# Patient Record
Sex: Female | Born: 1970 | Race: Black or African American | Hispanic: No | Marital: Married | Smoking: Former smoker
Health system: Southern US, Community
[De-identification: ages and names within clinical notes are randomized; demographics above are authoritative.]

## PROBLEM LIST (undated history)

## (undated) ENCOUNTER — Emergency Department (HOSPITAL_COMMUNITY): Admission: EM | Payer: Medicare HMO | Source: Home / Self Care

## (undated) DIAGNOSIS — Z8719 Personal history of other diseases of the digestive system: Secondary | ICD-10-CM

## (undated) DIAGNOSIS — E119 Type 2 diabetes mellitus without complications: Secondary | ICD-10-CM

## (undated) DIAGNOSIS — R519 Headache, unspecified: Secondary | ICD-10-CM

## (undated) DIAGNOSIS — F419 Anxiety disorder, unspecified: Secondary | ICD-10-CM

## (undated) DIAGNOSIS — Z87442 Personal history of urinary calculi: Secondary | ICD-10-CM

## (undated) DIAGNOSIS — I10 Essential (primary) hypertension: Secondary | ICD-10-CM

## (undated) DIAGNOSIS — O009 Unspecified ectopic pregnancy without intrauterine pregnancy: Secondary | ICD-10-CM

## (undated) DIAGNOSIS — J45909 Unspecified asthma, uncomplicated: Secondary | ICD-10-CM

## (undated) DIAGNOSIS — M1990 Unspecified osteoarthritis, unspecified site: Secondary | ICD-10-CM

## (undated) DIAGNOSIS — K219 Gastro-esophageal reflux disease without esophagitis: Secondary | ICD-10-CM

## (undated) HISTORY — PX: DILATION AND CURETTAGE OF UTERUS: SHX78

## (undated) HISTORY — PX: WRIST ARTHROSCOPY: SHX838

## (undated) HISTORY — PX: CHOLECYSTECTOMY: SHX55

## (undated) HISTORY — PX: ENDOMETRIAL ABLATION: SHX621

## (undated) HISTORY — PX: TRIGGER FINGER RELEASE: SHX641

## (undated) HISTORY — PX: SALPINGECTOMY: SHX328

## (undated) HISTORY — PX: HIP ARTHROSCOPY: SHX668

## (undated) HISTORY — PX: TUBAL LIGATION: SHX77

---

## 2013-02-06 DIAGNOSIS — X503XXA Overexertion from repetitive movements, initial encounter: Secondary | ICD-10-CM | POA: Insufficient documentation

## 2013-02-06 DIAGNOSIS — M778 Other enthesopathies, not elsewhere classified: Secondary | ICD-10-CM | POA: Insufficient documentation

## 2013-02-24 DIAGNOSIS — M6530 Trigger finger, unspecified finger: Secondary | ICD-10-CM | POA: Insufficient documentation

## 2013-10-14 DIAGNOSIS — Z01419 Encounter for gynecological examination (general) (routine) without abnormal findings: Secondary | ICD-10-CM | POA: Insufficient documentation

## 2015-02-21 DIAGNOSIS — M25859 Other specified joint disorders, unspecified hip: Secondary | ICD-10-CM | POA: Insufficient documentation

## 2015-05-10 DIAGNOSIS — Z9889 Other specified postprocedural states: Secondary | ICD-10-CM | POA: Insufficient documentation

## 2018-07-29 ENCOUNTER — Other Ambulatory Visit: Payer: Self-pay

## 2018-07-29 ENCOUNTER — Emergency Department (HOSPITAL_COMMUNITY): Payer: 59

## 2018-07-29 ENCOUNTER — Observation Stay (HOSPITAL_COMMUNITY)
Admission: EM | Admit: 2018-07-29 | Discharge: 2018-07-30 | Disposition: A | Discharge status: Home / Self Care | Payer: 59 | Attending: Internal Medicine | Admitting: Internal Medicine

## 2018-07-29 ENCOUNTER — Encounter (HOSPITAL_COMMUNITY): Payer: Self-pay | Admitting: Emergency Medicine

## 2018-07-29 DIAGNOSIS — I10 Essential (primary) hypertension: Secondary | ICD-10-CM | POA: Diagnosis not present

## 2018-07-29 DIAGNOSIS — R0789 Other chest pain: Principal | ICD-10-CM | POA: Insufficient documentation

## 2018-07-29 DIAGNOSIS — Z7984 Long term (current) use of oral hypoglycemic drugs: Secondary | ICD-10-CM | POA: Diagnosis not present

## 2018-07-29 DIAGNOSIS — E119 Type 2 diabetes mellitus without complications: Secondary | ICD-10-CM | POA: Insufficient documentation

## 2018-07-29 DIAGNOSIS — Z79899 Other long term (current) drug therapy: Secondary | ICD-10-CM | POA: Diagnosis not present

## 2018-07-29 DIAGNOSIS — R079 Chest pain, unspecified: Secondary | ICD-10-CM | POA: Diagnosis not present

## 2018-07-29 DIAGNOSIS — R0602 Shortness of breath: Secondary | ICD-10-CM | POA: Diagnosis present

## 2018-07-29 DIAGNOSIS — Z87891 Personal history of nicotine dependence: Secondary | ICD-10-CM | POA: Insufficient documentation

## 2018-07-29 DIAGNOSIS — Z20828 Contact with and (suspected) exposure to other viral communicable diseases: Secondary | ICD-10-CM | POA: Diagnosis not present

## 2018-07-29 DIAGNOSIS — J45909 Unspecified asthma, uncomplicated: Secondary | ICD-10-CM | POA: Diagnosis not present

## 2018-07-29 DIAGNOSIS — R9431 Abnormal electrocardiogram [ECG] [EKG]: Secondary | ICD-10-CM | POA: Diagnosis not present

## 2018-07-29 DIAGNOSIS — G8929 Other chronic pain: Secondary | ICD-10-CM | POA: Diagnosis not present

## 2018-07-29 DIAGNOSIS — Z8249 Family history of ischemic heart disease and other diseases of the circulatory system: Secondary | ICD-10-CM | POA: Insufficient documentation

## 2018-07-29 HISTORY — DX: Unspecified asthma, uncomplicated: J45.909

## 2018-07-29 HISTORY — DX: Essential (primary) hypertension: I10

## 2018-07-29 HISTORY — DX: Type 2 diabetes mellitus without complications: E11.9

## 2018-07-29 HISTORY — DX: Chest pain, unspecified: R07.9

## 2018-07-29 LAB — COMPREHENSIVE METABOLIC PANEL
ALT: 15 U/L (ref 0–44)
AST: 18 U/L (ref 15–41)
Albumin: 3.8 g/dL (ref 3.5–5.0)
Alkaline Phosphatase: 51 U/L (ref 38–126)
Anion gap: 14 (ref 5–15)
BUN: 11 mg/dL (ref 6–20)
CO2: 21 mmol/L — ABNORMAL LOW (ref 22–32)
Calcium: 9.4 mg/dL (ref 8.9–10.3)
Chloride: 101 mmol/L (ref 98–111)
Creatinine, Ser: 0.84 mg/dL (ref 0.44–1.00)
GFR calc Af Amer: >60 mL/min (ref >60)
GFR calc non Af Amer: >60 mL/min (ref >60)
Glucose, Bld: 249 mg/dL — ABNORMAL HIGH (ref 70–99)
Potassium: 3.9 mmol/L (ref 3.5–5.1)
Sodium: 136 mmol/L (ref 135–145)
Total Bilirubin: 0.4 mg/dL (ref 0.3–1.2)
Total Protein: 7.1 g/dL (ref 6.5–8.1)

## 2018-07-29 LAB — CBC WITH DIFFERENTIAL/PLATELET
Abs Immature Granulocytes: 0.13 10*3/uL — ABNORMAL HIGH (ref 0.00–0.07)
Basophils Absolute: 0 10*3/uL (ref 0.0–0.1)
Basophils Relative: 0 %
Eosinophils Absolute: 0.1 10*3/uL (ref 0.0–0.5)
Eosinophils Relative: 1 %
HCT: 41 % (ref 36.0–46.0)
Hemoglobin: 13.1 g/dL (ref 12.0–15.0)
Immature Granulocytes: 1 %
Lymphocytes Relative: 16 %
Lymphs Abs: 2.1 10*3/uL (ref 0.7–4.0)
MCH: 29.6 pg (ref 26.0–34.0)
MCHC: 32 g/dL (ref 30.0–36.0)
MCV: 92.6 fL (ref 80.0–100.0)
Monocytes Absolute: 0.6 10*3/uL (ref 0.1–1.0)
Monocytes Relative: 5 %
Neutro Abs: 10.6 10*3/uL — ABNORMAL HIGH (ref 1.7–7.7)
Neutrophils Relative %: 77 %
Platelets: 363 10*3/uL (ref 150–400)
RBC: 4.43 MIL/uL (ref 3.87–5.11)
RDW: 12.9 % (ref 11.5–15.5)
WBC: 13.5 10*3/uL — ABNORMAL HIGH (ref 4.0–10.5)
nRBC: 0 % (ref 0.0–0.2)

## 2018-07-29 LAB — TSH: TSH: 0.972 u[IU]/mL (ref 0.350–4.500)

## 2018-07-29 LAB — GLUCOSE, CAPILLARY: Glucose-Capillary: 155 mg/dL — ABNORMAL HIGH (ref 70–99)

## 2018-07-29 LAB — TROPONIN I: Troponin I: <0.03 ng/mL (ref <0.03)

## 2018-07-29 LAB — I-STAT BETA HCG BLOOD, ED (MC, WL, AP ONLY): I-stat hCG, quantitative: <5 m[IU]/mL (ref <5)

## 2018-07-29 MED ORDER — ASPIRIN EC 81 MG PO TBEC
81.0000 mg | DELAYED_RELEASE_TABLET | Freq: Every day | ORAL | Status: DC
  Administered 2018-07-30: 81 mg via ORAL
  Filled 2018-07-29: qty 1

## 2018-07-29 MED ORDER — MORPHINE SULFATE (PF) 4 MG/ML IV SOLN
4.0000 mg | Freq: Once | INTRAVENOUS | Status: AC
  Administered 2018-07-29: 4 mg via INTRAVENOUS
  Filled 2018-07-29: qty 1

## 2018-07-29 MED ORDER — NITROGLYCERIN 0.4 MG SL SUBL
0.4000 mg | SUBLINGUAL_TABLET | SUBLINGUAL | Status: DC | PRN
  Administered 2018-07-30: 0.4 mg via SUBLINGUAL
  Filled 2018-07-29: qty 1

## 2018-07-29 MED ORDER — INSULIN ASPART 100 UNIT/ML ~~LOC~~ SOLN
0.0000 [IU] | SUBCUTANEOUS | Status: DC
  Administered 2018-07-29: 2 [IU] via SUBCUTANEOUS

## 2018-07-29 MED ORDER — ACETAMINOPHEN 325 MG PO TABS: 650.0000 mg | ORAL_TABLET | ORAL | Status: DC | PRN

## 2018-07-29 MED ORDER — IOHEXOL 350 MG/ML SOLN
75.0000 mL | Freq: Once | INTRAVENOUS | Status: AC | PRN
  Administered 2018-07-29: 75 mL via INTRAVENOUS

## 2018-07-29 MED ORDER — HYDROCODONE-ACETAMINOPHEN 7.5-325 MG PO TABS
1.0000 | ORAL_TABLET | Freq: Four times a day (QID) | ORAL | Status: DC | PRN
  Administered 2018-07-29 – 2018-07-30 (×2): 1 via ORAL
  Filled 2018-07-29 (×2): qty 1

## 2018-07-29 MED ORDER — ENOXAPARIN SODIUM 40 MG/0.4ML ~~LOC~~ SOLN
40.0000 mg | SUBCUTANEOUS | Status: DC
  Administered 2018-07-29: 40 mg via SUBCUTANEOUS
  Filled 2018-07-29: qty 0.4

## 2018-07-29 MED ORDER — ALBUTEROL SULFATE (2.5 MG/3ML) 0.083% IN NEBU: 3.0000 mL | INHALATION_SOLUTION | Freq: Four times a day (QID) | RESPIRATORY_TRACT | Status: DC | PRN

## 2018-07-29 MED ORDER — SODIUM CHLORIDE 0.9% FLUSH: 3.0000 mL | INTRAVENOUS | Status: DC | PRN

## 2018-07-29 MED ORDER — VERAPAMIL HCL ER 180 MG PO TBCR
180.0000 mg | EXTENDED_RELEASE_TABLET | Freq: Every day | ORAL | Status: DC
  Administered 2018-07-30: 180 mg via ORAL
  Filled 2018-07-29: qty 1

## 2018-07-29 MED ORDER — ASPIRIN 81 MG PO CHEW
324.0000 mg | CHEWABLE_TABLET | Freq: Once | ORAL | Status: AC
  Administered 2018-07-29: 324 mg via ORAL
  Filled 2018-07-29: qty 4

## 2018-07-29 MED ORDER — SODIUM CHLORIDE 0.9 % IV SOLN: 250.0000 mL | INTRAVENOUS | Status: DC | PRN

## 2018-07-29 MED ORDER — PANTOPRAZOLE SODIUM 40 MG PO TBEC
40.0000 mg | DELAYED_RELEASE_TABLET | Freq: Every day | ORAL | Status: DC
  Administered 2018-07-30: 40 mg via ORAL
  Filled 2018-07-29: qty 1

## 2018-07-29 MED ORDER — SODIUM CHLORIDE 0.9% FLUSH
3.0000 mL | Freq: Two times a day (BID) | INTRAVENOUS | Status: DC
  Administered 2018-07-30 (×2): 3 mL via INTRAVENOUS

## 2018-07-29 MED ORDER — CYCLOBENZAPRINE HCL 10 MG PO TABS
10.0000 mg | ORAL_TABLET | Freq: Every day | ORAL | Status: DC
  Administered 2018-07-29: 10 mg via ORAL
  Filled 2018-07-29: qty 1

## 2018-07-29 MED ORDER — LORATADINE 10 MG PO TABS
10.0000 mg | ORAL_TABLET | Freq: Every evening | ORAL | Status: DC
  Administered 2018-07-29: 10 mg via ORAL
  Filled 2018-07-29: qty 1

## 2018-07-29 MED ORDER — SODIUM CHLORIDE 0.9 % IV BOLUS
1000.0000 mL | Freq: Once | INTRAVENOUS | Status: AC
  Administered 2018-07-29: 1000 mL via INTRAVENOUS

## 2018-07-29 MED ORDER — ONDANSETRON HCL 4 MG/2ML IJ SOLN: 4.0000 mg | Freq: Four times a day (QID) | INTRAMUSCULAR | Status: DC | PRN

## 2018-07-29 MED ORDER — RAMIPRIL 10 MG PO CAPS
10.0000 mg | ORAL_CAPSULE | Freq: Every day | ORAL | Status: DC
  Administered 2018-07-30: 10 mg via ORAL
  Filled 2018-07-29: qty 1

## 2018-07-29 NOTE — H&P (Signed)
History and Physical    Theresa Carter ELF:[id number] DOB: [birth year] DOA: 07/29/2018  PCP: Patient, No Pcp Per   Patient coming from: Home   Chief Complaint: Chest pain, SOB   HPI: Theresa Carter is a 48 y.o. female with medical history significant for hypertension, type 2 diabetes mellitus, and asthma, now presenting to the emergency department for evaluation of chest pain and shortness of breath.  Patient reports that the symptoms began roughly 3 weeks ago, have been intermittent initially, but became more persistent and severe today.  She was evaluated on 07/15/2018 for the symptoms, diagnosed with bronchitis, and treated with azithromycin and prednisone.  Patient thought that she was getting better initially with antibiotic and steroid, but then began to worsen again.  She has not had any chills or fevers during this illness, denies any sick contacts, has not had much of a cough at all, and has not traveled recently.  She denies swelling or tenderness in the lower extremities.  ED Course: Upon arrival to the ED, patient is found to be afebrile, saturating well on room air, and with vitals otherwise stable.  EKG features a sinus rhythm with nonspecific T wave abnormality in the inferior and septal leads.  CTA chest is negative for PE or other acute cardiopulmonary finding.  Chemistry panel is notable for a glucose of 249 and CBC features a leukocytosis to 13,500.  Initial troponin is undetectable.  Patient was given a liter of normal saline, 324 mg of aspirin, and 4 mg IV morphine in the ED.  She remains hemodynamically stable, and no apparent respiratory distress, and will be observed for ongoing evaluation and management.  Review of Systems:  All other systems reviewed and apart from HPI, are negative.  Past Medical History:  Diagnosis Date   Asthma    Diabetes mellitus without complication ([place])    Hypertension     History reviewed. No pertinent surgical history.   reports  that she has quit smoking. She has never used smokeless tobacco. She reports current alcohol use. She reports that she does not use drugs.  No Known Allergies  Family History  Problem Relation Age of Onset   Venous thrombosis Mother    Heart attack Brother    Venous thrombosis Son      Prior to Admission medications   Medication Sig Start Date End Date Taking? Authorizing Provider  Canagliflozin (INVOKANA PO) Take 1 tablet by mouth daily.   Yes [provider]  cyclobenzaprine (FLEXERIL) 10 MG tablet Take 1 tablet by mouth at bedtime.   Yes [provider]  HYDROcodone-acetaminophen (NORCO) 7.5-325 MG tablet Take 1 tablet by mouth every 6 (six) hours as needed for pain.   Yes [provider]  levocetirizine (XYZAL) 5 MG tablet Take 5 mg by mouth every evening.   Yes [provider]  liraglutide (VICTOZA) 18 MG/3ML SOPN Inject 0.6 mg into the skin daily.   Yes [provider]  metFORMIN (GLUCOPHAGE) 1000 MG tablet Take 1 tablet by mouth 2 (two) times daily.   Yes [provider]  omeprazole (PRILOSEC) 40 MG capsule Take 40 mg by mouth daily.   Yes [provider]  ramipril (ALTACE) 10 MG capsule Take 1 capsule by mouth daily.   Yes [provider]  verapamil (VERELAN PM) 180 MG 24 hr capsule Take 1 capsule by mouth daily.   Yes [provider]    Physical Exam: Vitals:   07/29/18 1924 07/29/18 2015 07/29/18 2045  07/29/18 2100  BP: (!) 157/90 137/86 135/81 132/85  Pulse: 91 82 86 84  Resp: 18 16 12 17   Temp:      TempSrc:      SpO2: 100% 100% 100% 100%    Constitutional: NAD, calm  Eyes: PERTLA, lids and conjunctivae normal ENMT: Mucous membranes are moist. Posterior pharynx clear of any exudate or lesions.   Neck: normal, supple, no masses, no thyromegaly Respiratory: clear to auscultation bilaterally, no wheezing, no crackles. Normal respiratory effort.   Cardiovascular: S1 & S2 heard, regular  rate and rhythm. No extremity edema.  Abdomen: No distension, no tenderness, soft. Bowel sounds normal.  Musculoskeletal: no clubbing / cyanosis. No joint deformity upper and lower extremities.   Skin: no significant rashes, lesions, ulcers. Warm, dry, well-perfused. Neurologic: CN 2-12 grossly intact. Sensation intact. Strength 5/5 in all 4 limbs.  Psychiatric: Alert and oriented x 3. Calm, cooperative.    Labs on Admission: I have personally reviewed following labs and imaging studies  CBC: Recent Labs  Lab 07/29/18 1929  WBC 13.5*  NEUTROABS 10.6*  HGB 13.1  HCT 41.0  MCV 92.6  PLT 259   Basic Metabolic Panel: Recent Labs  Lab 07/29/18 1929  NA 136  K 3.9  CL 101  CO2 21*  GLUCOSE 249*  BUN 11  CREATININE 0.84  CALCIUM 9.4   GFR: CrCl cannot be calculated (Unknown ideal weight.). Liver Function Tests: Recent Labs  Lab 07/29/18 1929  AST 18  ALT 15  ALKPHOS 51  BILITOT 0.4  PROT 7.1  ALBUMIN 3.8   No results for input(s): LIPASE, AMYLASE in the last 168 hours. No results for input(s): AMMONIA in the last 168 hours. Coagulation Profile: No results for input(s): INR, PROTIME in the last 168 hours. Cardiac Enzymes: Recent Labs  Lab 07/29/18 1929  TROPONINI <0.03   BNP (last 3 results) No results for input(s): PROBNP in the last 8760 hours. HbA1C: No results for input(s): HGBA1C in the last 72 hours. CBG: No results for input(s): GLUCAP in the last 168 hours. Lipid Profile: No results for input(s): CHOL, HDL, LDLCALC, TRIG, CHOLHDL, LDLDIRECT in the last 72 hours. Thyroid Function Tests: No results for input(s): TSH, T4TOTAL, FREET4, T3FREE, THYROIDAB in the last 72 hours. Anemia Panel: No results for input(s): VITAMINB12, FOLATE, FERRITIN, TIBC, IRON, RETICCTPCT in the last 72 hours. Urine analysis: No results found for: COLORURINE, APPEARANCEUR, LABSPEC, PHURINE, GLUCOSEU, HGBUR, BILIRUBINUR, KETONESUR, PROTEINUR, UROBILINOGEN, NITRITE,  LEUKOCYTESUR Sepsis Labs: @LABRCNTIP (procalcitonin:4,lacticidven:4) )No results found for this or any previous visit (from the past 240 hour(s)).   Radiological Exams on Admission: Ct Angio Chest Pe W And/or Wo Contrast  Result Date: 07/29/2018 CLINICAL DATA:  Shortness of breath and chest pain EXAM: CT ANGIOGRAPHY CHEST WITH CONTRAST TECHNIQUE: Multidetector CT imaging of the chest was performed using the standard protocol during bolus administration of intravenous contrast. Multiplanar CT image reconstructions and MIPs were obtained to evaluate the vascular anatomy. CONTRAST:  47mL OMNIPAQUE IOHEXOL 350 MG/ML SOLN COMPARISON:  Chest radiograph July 29, 2018 FINDINGS: Cardiovascular: There is no demonstrable pulmonary embolus. There is no thoracic aortic aneurysm or dissection. Visualized great vessels appear unremarkable. There is no appreciable pericardial effusion or pericardial thickening. Mediastinum/Nodes: There are nodular opacities throughout the thyroid, with the largest thyroid nodular lesion measuring just over 1 cm. There is no appreciable thoracic adenopathy. No esophageal lesions are evident. Lungs/Pleura: There is no edema or consolidation. No pleural effusion or pleural thickening evident. Upper Abdomen: Gallbladder is  absent. Visualized upper abdominal structures appear unremarkable. Musculoskeletal: There are no blastic or lytic bone lesions. No intramuscular lesions are evident. Review of the MIP images confirms the above findings. IMPRESSION: 1. No demonstrable pulmonary embolus. No thoracic aortic aneurysm or dissection. 2. Multinodular goiter without dominant mass. No further imaging surveillance is felt to be warranted based on consensus guidelines. 3.  Lungs clear. 4.  No appreciable thoracic adenopathy. 5.  Gallbladder absent. Electronically Signed   By: Lowella Grip III M.D.   On: 07/29/2018 20:43   Dg Chest Portable 1 View  Result Date: 07/29/2018 CLINICAL DATA:  Chest pain  and shortness of breath EXAM: PORTABLE CHEST 1 VIEW COMPARISON:  None. FINDINGS: The lungs are clear. Heart size and pulmonary vascularity are normal. No adenopathy. No pneumothorax. No bone lesions. IMPRESSION: No edema or consolidation. Electronically Signed   By: Lowella Grip III M.D.   On: 07/29/2018 19:31    EKG: Independently reviewed. Sinus rhythm, non-specific T-wave abnormality in inferior and septal leads.   Assessment/Plan   1. Chest pain  - Presents with 2-3 wks of chest pain and SOB, becoming more severe and persistent on day of admission  - She does not have known CAD, but RF's include T2DM, HTN, and FHx of premature CAD  - There are non-specific EKG abnormalities with no priors available for comparison; CTA negative for PE or other acute finding; initial troponin <0.03  - She was given ASA 324 mg in ED  - Continue cardiac monitoring, trend EKG, obtain serial troponin measurements, continue ASA    2. Hypertension  - BP at goal  - Continue ramipril and verapamil as tolerated   3. Type II DM  - No A1c on file  - Managed at home with Victoza, Invokana, and metformin  - Check CBG's and use a SSI with Novolog while in hospital   4. Asthma  - No cough or wheeze on admission   - Continue as needed albuterol MDI    5. Chronic pain   - Continue home regimen with as-needed Norco and Flexeril     DVT prophylaxis: Lovenox  Code Status: Full  Family Communication: Discussed with patient  Consults called: None  Admission status: Observation     Vianne Bulls, MD [facility] Pager [phone]  If 7PM-7AM, please contact night-coverage [email or web address] Password [facility]  07/29/2018, 9:59 PM

## 2018-07-29 NOTE — ED Notes (Signed)
ED TO INPATIENT HANDOFF REPORT  ED Nurse Name and Phone #: [phone]  S Name/Age/Gender Theresa Carter 48 y.o. female Room/Bed: 012C/012C  Code Status   Code Status: Not on file  Home/SNF/Other Home Patient oriented to: self, place, time and situation Is this baseline? Yes   Triage Complete: Triage complete  Chief Complaint Chest Pain; SOB; Dizziness  Triage Note Pt c/o intermittent chest pain and shortness of breath  3 weeks. Reports shortness of breath worsening today, recently treated for bronchitis with no change. Denies fevers, no known sick contacts.   Allergies No Known Allergies  Level of Care/Admitting Diagnosis ED Disposition    ED Disposition Condition [facility] Hospital Area: [place] [[id number]]  Level of Care: Progressive [102]  I expect the patient will be discharged within 24 hours: Yes  LOW acuity---Tx typically complete <24 hrs---ACUTE conditions typically can be evaluated <24 hours---LABS likely to return to acceptable Carter <24 hours---IS near functional baseline---EXPECTED to return to current living arrangement---NOT newly hypoxic: Meets criteria for 5C-Observation unit  Diagnosis: Chest pain [191478]  Admitting Physician: Vianne Bulls [[id number]]  Attending Physician: Vianne Bulls [[id number]]  PT Class (Do Not Modify): Observation [104]  PT Acc Code (Do Not Modify): Observation [10022]       B Medical/Surgery History Past Medical History:  Diagnosis Date  . Asthma   . Diabetes mellitus without complication ([place])   . Hypertension    History reviewed. No pertinent surgical history.   A IV Location/Drains/Wounds Patient Lines/Drains/Airways Status   Active Line/Drains/Airways    Name:   Placement date:   Placement time:   Site:   Days:   Peripheral IV 07/29/18 Right Antecubital   07/29/18    1924    Antecubital   less than 1          Intake/Output Last 24 hours  Intake/Output Summary (Last 24 hours) at  07/29/2018 2156 Last data filed at 07/29/2018 2110 Gross per 24 hour  Intake 1000 ml  Output -  Net 1000 ml    Labs/Imaging Results for orders placed or performed during the hospital encounter of 07/29/18 (from the past 48 hour(s))  CBC with Differential     Status: Abnormal   Collection Time: 07/29/18  7:29 PM  Result Value Ref Range   WBC 13.5 (H) 4.0 - 10.5 K/uL   RBC 4.43 3.87 - 5.11 MIL/uL   Hemoglobin 13.1 12.0 - 15.0 g/dL   HCT 41.0 36.0 - 46.0 %   MCV 92.6 80.0 - 100.0 fL   MCH 29.6 26.0 - 34.0 pg   MCHC 32.0 30.0 - 36.0 g/dL   RDW 12.9 11.5 - 15.5 %   Platelets 363 150 - 400 K/uL   nRBC 0.0 0.0 - 0.2 %   Neutrophils Relative % 77 %   Neutro Abs 10.6 (H) 1.7 - 7.7 K/uL   Lymphocytes Relative 16 %   Lymphs Abs 2.1 0.7 - 4.0 K/uL   Monocytes Relative 5 %   Monocytes Absolute 0.6 0.1 - 1.0 K/uL   Eosinophils Relative 1 %   Eosinophils Absolute 0.1 0.0 - 0.5 K/uL   Basophils Relative 0 %   Basophils Absolute 0.0 0.0 - 0.1 K/uL   Immature Granulocytes 1 %   Abs Immature Granulocytes 0.13 (H) 0.00 - 0.07 K/uL    Comment: Performed at [facility], [address], [place], [place] [address]  Troponin I - Once  Status: None   Collection Time: 07/29/18  7:29 PM  Result Value Ref Range   Troponin I <0.03 <0.03 ng/mL    Comment: Performed at [facility], [place] [address] [place], [place] [address]  Comprehensive metabolic panel     Status: Abnormal   Collection Time: 07/29/18  7:29 PM  Result Value Ref Range   Sodium 136 135 - 145 mmol/L   Potassium 3.9 3.5 - 5.1 mmol/L   Chloride 101 98 - 111 mmol/L   CO2 21 (L) 22 - 32 mmol/L   Glucose, Bld 249 (H) 70 - 99 mg/dL   BUN 11 6 - 20 mg/dL   Creatinine, Ser 0.84 0.44 - 1.00 mg/dL   Calcium 9.4 8.9 - 10.3 mg/dL   Total Protein 7.1 6.5 - 8.1 g/dL   Albumin 3.8 3.5 - 5.0 g/dL   AST 18 15 - 41 U/L   ALT 15 0 - 44 U/L   Alkaline Phosphatase 51 38 - 126 U/L   Total Bilirubin 0.4 0.3 - 1.2 mg/dL   GFR calc non  Af Amer >60 >60 mL/min   GFR calc Af Amer >60 >60 mL/min   Anion gap 14 5 - 15    Comment: Performed at [facility], [place] [address], [place], [place] [address]  I-Stat Beta hCG blood, ED ([facility], [facility], [facility] only)     Status: None   Collection Time: 07/29/18  7:41 PM  Result Value Ref Range   I-stat hCG, quantitative <5.0 <5 mIU/mL   Comment 3            Comment:   GEST. AGE      CONC.  (mIU/mL)   <=1 WEEK        5 - 50     2 WEEKS       50 - 500     3 WEEKS       100 - 10,000     4 WEEKS     1,000 - 30,000        FEMALE AND NON-PREGNANT FEMALE:     LESS THAN 5 mIU/mL    Ct Angio Chest Pe W And/or Wo Contrast  Result Date: 07/29/2018 CLINICAL DATA:  Shortness of breath and chest pain EXAM: CT ANGIOGRAPHY CHEST WITH CONTRAST TECHNIQUE: Multidetector CT imaging of the chest was performed using the standard protocol during bolus administration of intravenous contrast. Multiplanar CT image reconstructions and MIPs were obtained to evaluate the vascular anatomy. CONTRAST:  24mL OMNIPAQUE IOHEXOL 350 MG/ML SOLN COMPARISON:  Chest radiograph July 29, 2018 FINDINGS: Cardiovascular: There is no demonstrable pulmonary embolus. There is no thoracic aortic aneurysm or dissection. Visualized great vessels appear unremarkable. There is no appreciable pericardial effusion or pericardial thickening. Mediastinum/Nodes: There are nodular opacities throughout the thyroid, with the largest thyroid nodular lesion measuring just over 1 cm. There is no appreciable thoracic adenopathy. No esophageal lesions are evident. Lungs/Pleura: There is no edema or consolidation. No pleural effusion or pleural thickening evident. Upper Abdomen: Gallbladder is absent. Visualized upper abdominal structures appear unremarkable. Musculoskeletal: There are no blastic or lytic bone lesions. No intramuscular lesions are evident. Review of the MIP images confirms the above findings. IMPRESSION: 1. No demonstrable pulmonary embolus. No  thoracic aortic aneurysm or dissection. 2. Multinodular goiter without dominant mass. No further imaging surveillance is felt to be warranted based on consensus guidelines. 3.  Lungs clear. 4.  No appreciable thoracic adenopathy. 5.  Gallbladder absent. Electronically Signed  By: Lowella Grip III M.D.   On: 07/29/2018 20:43   Dg Chest Portable 1 View  Result Date: 07/29/2018 CLINICAL DATA:  Chest pain and shortness of breath EXAM: PORTABLE CHEST 1 VIEW COMPARISON:  None. FINDINGS: The lungs are clear. Heart size and pulmonary vascularity are normal. No adenopathy. No pneumothorax. No bone lesions. IMPRESSION: No edema or consolidation. Electronically Signed   By: Lowella Grip III M.D.   On: 07/29/2018 19:31    Pending Labs Unresulted Labs (From admission, onward)    Start     Ordered   07/29/18 2130  TSH  ONCE - STAT,   STAT     07/29/18 2129          Vitals/Pain Today's Vitals   07/29/18 2015 07/29/18 2045 07/29/18 2100 07/29/18 2109  BP: 137/86 135/81 132/85   Pulse: 82 86 84   Resp: 16 12 17    Temp:      TempSrc:      SpO2: 100% 100% 100%   PainSc:    5     Isolation Precautions No active isolations  Medications Medications  aspirin chewable tablet 324 mg (324 mg Oral Given 07/29/18 1927)  morphine 4 MG/ML injection 4 mg (4 mg Intravenous Given 07/29/18 1928)  sodium chloride 0.9 % bolus 1,000 mL (0 mLs Intravenous Stopped 07/29/18 2110)  iohexol (OMNIPAQUE) 350 MG/ML injection 75 mL (75 mLs Intravenous Contrast Given 07/29/18 2024)    Mobility walks Low fall risk   Focused Assessments Cardiac Assessment Handoff:    Lab Results  Component Value Date   TROPONINI <0.03 07/29/2018   No results found for: DDIMER Does the Patient currently have chest pain? Yes     R Recommendations: See Admitting Provider Note  Report given to:   Additional Notes:

## 2018-07-29 NOTE — ED Provider Notes (Signed)
[facility] EMERGENCY DEPARTMENT Provider Note   CSN: [id number] Arrival date & time: 07/29/18  1807    History   Chief Complaint Chief Complaint  Patient presents with   Chest Pain   Shortness of Breath    HPI Theresa Carter is a 48 y.o. female history of hypertension, diabetes who presents for evaluation of chest pain.  Patient reports that over the last 2 weeks, she has had cough that is not productive and some mild difficulty breathing.  She was seen in urgent care on 07/15/2018 for evaluation of symptoms.  At that time, she was diagnosed with viral URI/bronchitis and was discharged with azithromycin and prednisone.  Patient reports she finished medications states she felt better for about a day or so but then states that her symptoms worsened 2 days ago.  He states 2 days ago, she started developing some intermittent chest pain and felt like she was having more difficulty breathing. She was using her inhalers with no improvement. Today at about 12 pm she started developing worsening chest pain and shortness of breath. She describes the pain as a heaviness across her chest. She states initially when she got the pain she had some diaphoresis but that has improved.  She reports that since then, chest pain has been constant.  She describes it as a heaviness.  Patient states that pain is worse with deep inspiration.  She also feels like her shortness of breath and chest pain are worse with exertion.  Patient states that she "feels like she cannot get a deep breath in."  Patient states she is not a current smoker and denies any IV or illicit drug use.  She states that she has a history of high blood pressure, diabetes.  She reports brother had heart attack and died at age 26.  She states that she has not had any personal cardiac history.  Patient states she is not currently on OCPs, exogenous hormone use.  Patient denies any recent travel, leg swelling, history of blood clots  or PEs, recent hospitalization, recent surgery.  She does report that her mom had a blood clot in her leg she states that her son has had blood clots and was diagnosed with Behcet's disease and was lupus antibody positive.  Patient reports she has never been tested.  Reports some associated lightheadedness.  She denies any fevers, abdominal pain, nausea/vomiting, numbness/weakness of arms or legs, difficulty ambulating.    The history is provided by the patient.    Past Medical History:  Diagnosis Date   Asthma    Diabetes mellitus without complication ([place])    Hypertension     Patient Active Problem List   Diagnosis Date Noted   Chest pain 07/29/2018   Essential hypertension 07/29/2018   Asthma 07/29/2018   Diabetes mellitus type II, non insulin dependent ([place]) 07/29/2018   Chronic pain 07/29/2018    History reviewed. No pertinent surgical history.   OB History   No obstetric history on file.      Home Medications    Prior to Admission medications   Medication Sig Start Date End Date Taking? Authorizing Provider  Canagliflozin (INVOKANA PO) Take 1 tablet by mouth daily.   Yes [provider]  cyclobenzaprine (FLEXERIL) 10 MG tablet Take 1 tablet by mouth at bedtime.   Yes [provider]  HYDROcodone-acetaminophen (NORCO) 7.5-325 MG tablet Take 1 tablet by mouth every 6 (six) hours as needed for pain.   Yes [provider]  levocetirizine (XYZAL) 5 MG tablet Take 5 mg by mouth every evening.   Yes [provider]  liraglutide (VICTOZA) 18 MG/3ML SOPN Inject 0.6 mg into the skin daily.   Yes [provider]  metFORMIN (GLUCOPHAGE) 1000 MG tablet Take 1 tablet by mouth 2 (two) times daily.   Yes [provider]  omeprazole (PRILOSEC) 40 MG capsule Take 40 mg by mouth daily.   Yes [provider]  ramipril (ALTACE) 10 MG capsule Take 1 capsule by mouth daily.   Yes [provider]  verapamil  (VERELAN PM) 180 MG 24 hr capsule Take 1 capsule by mouth daily.   Yes [provider]    Family History Family History  Problem Relation Age of Onset   Venous thrombosis Mother    Heart attack Brother    Venous thrombosis Son     Social History Social History   Tobacco Use   Smoking status: Former Smoker   Smokeless tobacco: Never Used  Substance Use Topics   Alcohol use: Yes   Drug use: Never     Allergies   Patient has no known allergies.   Review of Systems Review of Systems  Constitutional: Negative for fever.  Respiratory: Positive for cough and shortness of breath.   Cardiovascular: Positive for chest pain.  Gastrointestinal: Positive for diarrhea. Negative for abdominal pain, nausea and vomiting.  Genitourinary: Negative for dysuria and hematuria.  Neurological: Positive for light-headedness. Negative for syncope, weakness and headaches.  All other systems reviewed and are negative.    Physical Exam Updated Vital Signs BP (!) 137/94    Pulse 81    Temp 98.3 F (36.8 C) (Axillary)    Resp 15    Ht 5\' 5"  (1.651 m)    Wt 100.9 kg    SpO2 98%    BMI 37.02 kg/m   Physical Exam Vitals signs and nursing note reviewed.  Constitutional:      Appearance: Normal appearance. She is well-developed.  HENT:     Head: Normocephalic and atraumatic.  Eyes:     General: Lids are normal.     Conjunctiva/sclera: Conjunctivae normal.     Pupils: Pupils are equal, round, and reactive to light.  Neck:     Musculoskeletal: Full passive range of motion without pain.  Cardiovascular:     Rate and Rhythm: Regular rhythm. Tachycardia present.     Pulses: Normal pulses.          Radial pulses are 2+ on the right side and 2+ on the left side.       Dorsalis pedis pulses are 2+ on the right side and 2+ on the left side.     Heart sounds: Normal heart sounds. No murmur. No friction rub. No gallop.   Pulmonary:     Effort: Pulmonary effort is normal. Tachypnea  present.     Breath sounds: Normal breath sounds. No decreased breath sounds or wheezing.     Comments: Patient is tachypneic with mild increased work of breathing.  No evidence of wheezing.  No rales noted. Abdominal:     Palpations: Abdomen is soft. Abdomen is not rigid.     Tenderness: There is no abdominal tenderness. There is no guarding.     Comments: Abdomen is soft, non-distended, non-tender. No rigidity, No guarding. No peritoneal signs.  Musculoskeletal: Normal range of motion.  Skin:    General: Skin is warm and dry.     Capillary Refill: Capillary refill takes  less than 2 seconds.  Neurological:     Mental Status: She is alert and oriented to person, place, and time.     Comments: Follows commands, Moves all extremities  5/5 strength to BUE and BLE  Sensation intact throughout all major nerve distributions  Psychiatric:        Speech: Speech normal.      ED Treatments / Results  Labs (all labs ordered are listed, but only abnormal results are displayed) Labs Reviewed  CBC WITH DIFFERENTIAL/PLATELET - Abnormal; Notable for the following components:      Result Value   WBC 13.5 (*)    Neutro Abs 10.6 (*)    Abs Immature Granulocytes 0.13 (*)    All other components within normal limits  COMPREHENSIVE METABOLIC PANEL - Abnormal; Notable for the following components:   CO2 21 (*)    Glucose, Bld 249 (*)    All other components within normal limits  GLUCOSE, CAPILLARY - Abnormal; Notable for the following components:   Glucose-Capillary 155 (*)    All other components within normal limits  TROPONIN I  TSH  HIV ANTIBODY (ROUTINE TESTING W REFLEX)  BASIC METABOLIC PANEL  CBC  TROPONIN I  TROPONIN I  I-STAT BETA HCG BLOOD, ED ([facility], [facility], [facility] ONLY)    EKG EKG Interpretation  Date/Time:  Tuesday July 29 2018 18:12:46 EDT Ventricular Rate:  104 PR Interval:  176 QRS Duration: 98 QT Interval:  360 QTC Calculation: 473 R Axis:   40 Text Interpretation:  Sinus  tachycardia with occasional Premature ventricular complexes Low voltage QRS Cannot rule out Anterior infarct , age undetermined Abnormal ECG TWI in inferior leads No old tracing to compare Confirmed by Duffy Bruce [phone]) on 07/29/2018 6:59:17 PM   Radiology Ct Angio Chest Pe W And/or Wo Contrast  Result Date: 07/29/2018 CLINICAL DATA:  Shortness of breath and chest pain EXAM: CT ANGIOGRAPHY CHEST WITH CONTRAST TECHNIQUE: Multidetector CT imaging of the chest was performed using the standard protocol during bolus administration of intravenous contrast. Multiplanar CT image reconstructions and MIPs were obtained to evaluate the vascular anatomy. CONTRAST:  35mL OMNIPAQUE IOHEXOL 350 MG/ML SOLN COMPARISON:  Chest radiograph July 29, 2018 FINDINGS: Cardiovascular: There is no demonstrable pulmonary embolus. There is no thoracic aortic aneurysm or dissection. Visualized great vessels appear unremarkable. There is no appreciable pericardial effusion or pericardial thickening. Mediastinum/Nodes: There are nodular opacities throughout the thyroid, with the largest thyroid nodular lesion measuring just over 1 cm. There is no appreciable thoracic adenopathy. No esophageal lesions are evident. Lungs/Pleura: There is no edema or consolidation. No pleural effusion or pleural thickening evident. Upper Abdomen: Gallbladder is absent. Visualized upper abdominal structures appear unremarkable. Musculoskeletal: There are no blastic or lytic bone lesions. No intramuscular lesions are evident. Review of the MIP images confirms the above findings. IMPRESSION: 1. No demonstrable pulmonary embolus. No thoracic aortic aneurysm or dissection. 2. Multinodular goiter without dominant mass. No further imaging surveillance is felt to be warranted based on consensus guidelines. 3.  Lungs clear. 4.  No appreciable thoracic adenopathy. 5.  Gallbladder absent. Electronically Signed   By: Lowella Grip III M.D.   On: 07/29/2018 20:43    Dg Chest Portable 1 View  Result Date: 07/29/2018 CLINICAL DATA:  Chest pain and shortness of breath EXAM: PORTABLE CHEST 1 VIEW COMPARISON:  None. FINDINGS: The lungs are clear. Heart size and pulmonary vascularity are normal. No adenopathy. No pneumothorax. No bone lesions. IMPRESSION: No edema or consolidation. Electronically Signed  By: Lowella Grip III M.D.   On: 07/29/2018 19:31    Procedures Procedures (including critical care time)  Medications Ordered in ED Medications  HYDROcodone-acetaminophen (NORCO) 7.5-325 MG per tablet 1 tablet (1 tablet Oral Given 07/29/18 2302)  ramipril (ALTACE) capsule 10 mg (has no administration in time range)  verapamil (CALAN-SR) CR tablet 180 mg (has no administration in time range)  pantoprazole (PROTONIX) EC tablet 40 mg (has no administration in time range)  cyclobenzaprine (FLEXERIL) tablet 10 mg (has no administration in time range)  loratadine (CLARITIN) tablet 10 mg (10 mg Oral Given 07/29/18 2302)  aspirin EC tablet 81 mg (has no administration in time range)  nitroGLYCERIN (NITROSTAT) SL tablet 0.4 mg (has no administration in time range)  acetaminophen (TYLENOL) tablet 650 mg (has no administration in time range)  ondansetron (ZOFRAN) injection 4 mg (has no administration in time range)  insulin aspart (novoLOG) injection 0-9 Units (2 Units Subcutaneous Given 07/29/18 2303)  enoxaparin (LOVENOX) injection 40 mg (40 mg Subcutaneous Given 07/29/18 2302)  sodium chloride flush (NS) 0.9 % injection 3 mL (has no administration in time range)  sodium chloride flush (NS) 0.9 % injection 3 mL (has no administration in time range)  0.9 %  sodium chloride infusion (has no administration in time range)  albuterol (PROVENTIL) (2.5 MG/3ML) 0.083% nebulizer solution 3 mL (has no administration in time range)  aspirin chewable tablet 324 mg (324 mg Oral Given 07/29/18 1927)  morphine 4 MG/ML injection 4 mg (4 mg Intravenous Given 07/29/18 1928)  sodium  chloride 0.9 % bolus 1,000 mL (0 mLs Intravenous Stopped 07/29/18 2110)  iohexol (OMNIPAQUE) 350 MG/ML injection 75 mL (75 mLs Intravenous Contrast Given 07/29/18 2024)     Initial Impression / Assessment and Plan / ED Course  I have reviewed the triage vital signs and the nursing notes.  Pertinent labs & imaging results that were available during my care of the patient were reviewed by me and considered in my medical decision making (see chart for details).        48 y.o. F presents for evaluation of worsening shortness of breath and chest pain.  She states that for the last 2 weeks, she has been having some coughing and difficulty breathing.  Saw urgent care and was evaluated for bronchitis.  Was treated with azithromycin, prednisone and felt better for a few days.  Reports chest pain is of breath today.  Chest pain is worse with deep inspiration but also worse with exertion.  Additionally, she feels like she is having worsening shortness of breath with exertion.  Does not feel like she is wheezing.  No fevers, recent travel, sick contacts.  Initial ED arrival, she is hypertensive, tachycardic.  Concern for ACS etiology versus PE.  Low suspicion for infectious etiology but also consideration.  History/physical exam not concerning for dissection.  CBC shows leukocytosis of 13.5. No anemia. CMP shows bicarb of 21 and glucose of 249. Troponin negative. I-stat beta negative.   CXR negative for any acute infectious etiology. EKG shows T wave inversion in II, III, and AVF. No priors for comparison.   CTA of chest shows no evidence of PE.  There is mention of multi-nodular goiter without dominant mass.  Otherwise no acute.  Given patient's risk factors/history she has a heart score of 4/5. Plan for admission given new EKG findings.   Discussed patient with Dr. Myna Hidalgo (hospitalist). Will admit.   Portions of this note were generated with [employer].  Dictation errors may occur despite  best attempts at proofreading.   Naja Apperson was evaluated in Emergency Department on 07/29/2018 for the symptoms described in the history of present illness. She was evaluated in the context of the global COVID-19 pandemic, which necessitated consideration that the patient might be at risk for infection with the SARS-CoV-2 virus that causes COVID-19. Institutional protocols and algorithms that pertain to the evaluation of patients at risk for COVID-19 are in a state of rapid change based on information released by regulatory bodies including the CDC and federal and state organizations. These policies and algorithms were followed during the patient's care in the ED.  Final Clinical Impressions(s) / ED Diagnoses   Final diagnoses:  Shortness of breath  Other chest pain    ED Discharge Orders    None       Desma Mcgregor 07/29/18 2305    Duffy Bruce, MD 07/30/18 [phone]

## 2018-07-29 NOTE — ED Triage Notes (Signed)
Pt c/o intermittent chest pain and shortness of breath  3 weeks. Reports shortness of breath worsening today, recently treated for bronchitis with no change. Denies fevers, no known sick contacts.

## 2018-07-29 NOTE — ED Notes (Signed)
ED Provider at bedside. 

## 2018-07-30 DIAGNOSIS — I10 Essential (primary) hypertension: Secondary | ICD-10-CM | POA: Diagnosis not present

## 2018-07-30 DIAGNOSIS — E119 Type 2 diabetes mellitus without complications: Secondary | ICD-10-CM | POA: Diagnosis not present

## 2018-07-30 DIAGNOSIS — R079 Chest pain, unspecified: Secondary | ICD-10-CM | POA: Diagnosis not present

## 2018-07-30 DIAGNOSIS — R0789 Other chest pain: Secondary | ICD-10-CM | POA: Diagnosis not present

## 2018-07-30 DIAGNOSIS — J45909 Unspecified asthma, uncomplicated: Secondary | ICD-10-CM | POA: Diagnosis not present

## 2018-07-30 LAB — INFLUENZA PANEL BY PCR (TYPE A & B)
Influenza A By PCR: NEGATIVE
Influenza B By PCR: NEGATIVE

## 2018-07-30 LAB — BASIC METABOLIC PANEL
Anion gap: 7 (ref 5–15)
BUN: 13 mg/dL (ref 6–20)
CO2: 24 mmol/L (ref 22–32)
Calcium: 8.9 mg/dL (ref 8.9–10.3)
Chloride: 106 mmol/L (ref 98–111)
Creatinine, Ser: 0.65 mg/dL (ref 0.44–1.00)
GFR calc Af Amer: >60 mL/min (ref >60)
GFR calc non Af Amer: >60 mL/min (ref >60)
Glucose, Bld: 115 mg/dL — ABNORMAL HIGH (ref 70–99)
Potassium: 3.8 mmol/L (ref 3.5–5.1)
Sodium: 137 mmol/L (ref 135–145)

## 2018-07-30 LAB — CBC
HCT: 35.4 % — ABNORMAL LOW (ref 36.0–46.0)
Hemoglobin: 11.6 g/dL — ABNORMAL LOW (ref 12.0–15.0)
MCH: 30.3 pg (ref 26.0–34.0)
MCHC: 32.8 g/dL (ref 30.0–36.0)
MCV: 92.4 fL (ref 80.0–100.0)
Platelets: 327 10*3/uL (ref 150–400)
RBC: 3.83 MIL/uL — ABNORMAL LOW (ref 3.87–5.11)
RDW: 13.1 % (ref 11.5–15.5)
WBC: 11.9 10*3/uL — ABNORMAL HIGH (ref 4.0–10.5)
nRBC: 0 % (ref 0.0–0.2)

## 2018-07-30 LAB — HIV ANTIBODY (ROUTINE TESTING W REFLEX): HIV Screen 4th Generation wRfx: NONREACTIVE

## 2018-07-30 LAB — TROPONIN I
Troponin I: <0.03 ng/mL (ref <0.03)
Troponin I: <0.03 ng/mL (ref <0.03)

## 2018-07-30 LAB — GLUCOSE, CAPILLARY
Glucose-Capillary: 103 mg/dL — ABNORMAL HIGH (ref 70–99)
Glucose-Capillary: 98 mg/dL (ref 70–99)
Glucose-Capillary: 129 mg/dL — ABNORMAL HIGH (ref 70–99)

## 2018-07-30 MED ORDER — ALBUTEROL SULFATE (2.5 MG/3ML) 0.083% IN NEBU: 2.5000 mg | INHALATION_SOLUTION | Freq: Four times a day (QID) | RESPIRATORY_TRACT | 12 refills | Status: DC | PRN

## 2018-07-30 MED ORDER — ALBUTEROL SULFATE (2.5 MG/3ML) 0.083% IN NEBU: 2.5000 mg | INHALATION_SOLUTION | Freq: Four times a day (QID) | RESPIRATORY_TRACT | Status: DC

## 2018-07-30 MED ORDER — ALBUTEROL SULFATE HFA 108 (90 BASE) MCG/ACT IN AERS: 1.0000 | INHALATION_SPRAY | Freq: Four times a day (QID) | RESPIRATORY_TRACT | Status: DC

## 2018-07-30 MED ORDER — GUAIFENESIN-DM 100-10 MG/5ML PO SYRP: 5.0000 mL | ORAL_SOLUTION | ORAL | Status: DC | PRN

## 2018-07-30 MED ORDER — GUAIFENESIN-DM 100-10 MG/5ML PO SYRP: 5.0000 mL | ORAL_SOLUTION | ORAL | 0 refills | Status: DC | PRN

## 2018-07-30 MED ORDER — ASPIRIN 81 MG PO TBEC: 81.0000 mg | DELAYED_RELEASE_TABLET | Freq: Every day | ORAL | 0 refills | Status: DC

## 2018-07-30 MED ORDER — IBUPROFEN 400 MG PO TABS: 400.0000 mg | ORAL_TABLET | Freq: Four times a day (QID) | ORAL | Status: DC | PRN

## 2018-07-30 MED ORDER — IBUPROFEN 400 MG PO TABS: 400.0000 mg | ORAL_TABLET | Freq: Four times a day (QID) | ORAL | 0 refills | Status: DC | PRN

## 2018-07-30 MED ORDER — ALBUTEROL SULFATE HFA 108 (90 BASE) MCG/ACT IN AERS
1.0000 | INHALATION_SPRAY | Freq: Four times a day (QID) | RESPIRATORY_TRACT | Status: DC
  Administered 2018-07-30: 1 via RESPIRATORY_TRACT
  Filled 2018-07-30: qty 6.7

## 2018-07-30 NOTE — Progress Notes (Signed)
Attending cardiology review of echo order:  Due to the spread of COVID-19, departmental policy is to review orders for procedures and determine appropriateness regarding testing at this time. The following criteria are being used to limit potential exposure and spread of infection.  Is the patient being evaluated for COVID-19 infection: YES  Is the patient actively having infectious respiratory symptoms or fever: no reported fever, but respiratory symptoms for 3 weeks Does the patient have a known history of prior cardiovascular disease: hypertension Would the test change management of the patient: not urgently Can the test be performed at a later time: yes, once COVID screen returned (if negative)  Special circumstances: Is the patient undergoing evaluation for embolic CVA: Is the patient planned for chemotherapy:   Based on the review above, this study is not to be performed at this time. If COVID test returns negative, please contact echo department and study can be performed at that time.  Buford Dresser, MD, PhD [facility]  [address], [place], [place] [address] [phone]

## 2018-07-30 NOTE — TOC Transition Note (Signed)
Transition of Care [facility]) - CM/SW Discharge Note Marvetta Gibbons RN, BSN Transitions of Care Unit 4E- RN Case Manager [phone]  Patient Details  Name: Theresa Carter MRN: [id number] Date of Birth: 01/06/1971  [facility] CM/SW Contact:  Dawayne Patricia, RN Phone Number: 07/30/2018, 1:21 PM   Clinical Narrative:    Pt presented with chest pain, has been cleared for transition home, order written for neb. Machine. Call made to [facility] with [facility]- DME to be delivered to bedside prior to discharge.    Final next level of care: Home/Self Care Barriers to Discharge: No Barriers Identified   Patient Goals and CMS Choice Patient states their goals for this hospitalization and ongoing recovery are:: "go home" CMS Medicare.gov Compare Post Acute Care list provided to:: Patient Choice offered to / list presented to : Patient  Discharge Placement  Return home.                      Discharge Plan and Services   Discharge Planning Services: CM Consult Post Acute Care Choice: Durable Medical Equipment          DME Arranged: Nebulizer machine DME Agency: [employer]       Social Determinants of Health (SDOH) Interventions     Readmission Risk Interventions Readmission Risk Prevention Plan 07/30/2018  Post Dischage Appt Complete  Medication Screening Complete  Transportation Screening Complete

## 2018-07-30 NOTE — Discharge Summary (Addendum)
Physician Discharge Summary  Aquilla Shambley JQB:[id number] DOB: [birth year] DOA: 07/29/2018  PCP: Patient, No Pcp Per  Admit date: 07/29/2018 Discharge date: 07/30/2018  Admitted From: Home  Disposition: Home   Recommendations for Outpatient Follow-up:  1. Follow up with PCP in 1-2 weeks 2. Please obtain BMP/CBC in one week 3. Follow covid results.    Equipment/Devices: Nebulizer machine.   Discharge Condition: stable.  CODE STATUS: full code Diet recommendation: Heart Healthy   Brief/Interim Summary:  Theresa Carter is a 48 y.o. female with medical history significant for hypertension, type 2 diabetes mellitus, and asthma, now presenting to the emergency department for evaluation of chest pain and shortness of breath.  Patient reports that the symptoms began roughly 3 weeks ago, have been intermittent initially, but became more persistent and severe today.  She was evaluated on 07/15/2018 for the symptoms, diagnosed with bronchitis, and treated with azithromycin and prednisone.  Patient thought that she was getting better initially with antibiotic and steroid, but then began to worsen again.  She has not had any chills or fevers during this illness, denies any sick contacts, has not had much of a cough at all, and has not traveled recently.  She denies swelling or tenderness in the lower extremities.  ED Course: Upon arrival to the ED, patient is found to be afebrile, saturating well on room air, and with vitals otherwise stable.  EKG features a sinus rhythm with nonspecific T wave abnormality in the inferior and septal leads.  CTA chest is negative for PE or other acute cardiopulmonary finding.  Chemistry panel is notable for a glucose of 249 and CBC features a leukocytosis to 13,500.  Initial troponin is undetectable.  Patient was given a liter of normal saline, 324 mg of aspirin, and 4 mg IV morphine in the ED.  She remains hemodynamically stable, and no apparent respiratory distress,  and will be observed for ongoing evaluation and management.  1-Chest pain; worse on palpation. Likely related to costochondritis. EKG reviewed with cardiology no evidence of ischemia.  CT angio negative for PE. No pericardial effusion.  Follow up with PCP for possible ECHO out-patient.  Troponin times 3 negative.   2-Dyspnea, cough;  For 2 weeks.  Check x ray negative for PNA.  Low risk covid. covid test pending.  Will provide nebulizer albuterol to help with asthma. No wheezing on lung exam.  Chest x ray negative for edema.   on initial patient assessment I use surgical mask and Glasses.   3-HTN; continue with home medications.   4-DM; continue with home medications.     Discharge Diagnoses:  Principal Problem:   Chest pain Active Problems:   Essential hypertension   Asthma   Diabetes mellitus type II, non insulin dependent (HCC)   Chronic pain    Discharge Instructions  Discharge Instructions    Diet - low sodium heart healthy   Complete by:  As directed    Increase activity slowly   Complete by:  As directed      Allergies as of 07/30/2018   No Known Allergies     Medication List    TAKE these medications   albuterol (2.5 MG/3ML) 0.083% nebulizer solution Commonly known as:  PROVENTIL Take 3 mLs (2.5 mg total) by nebulization every 6 (six) hours as needed for wheezing or shortness of breath.   aspirin 81 MG EC tablet Take 1 tablet (81 mg total) by mouth daily. Start taking on:  July 31, 2018  cyclobenzaprine 10 MG tablet Commonly known as:  FLEXERIL Take 1 tablet by mouth at bedtime.   guaiFENesin-dextromethorphan 100-10 MG/5ML syrup Commonly known as:  ROBITUSSIN DM Take 5 mLs by mouth every 4 (four) hours as needed for cough.   HYDROcodone-acetaminophen 7.5-325 MG tablet Commonly known as:  NORCO Take 1 tablet by mouth every 6 (six) hours as needed for pain.   ibuprofen 400 MG tablet Commonly known as:  ADVIL,MOTRIN Take 1 tablet (400 mg  total) by mouth every 6 (six) hours as needed for mild pain.   INVOKANA PO Take 1 tablet by mouth daily.   levocetirizine 5 MG tablet Commonly known as:  XYZAL Take 5 mg by mouth every evening.   liraglutide 18 MG/3ML Sopn Commonly known as:  VICTOZA Inject 0.6 mg into the skin daily.   metFORMIN 1000 MG tablet Commonly known as:  GLUCOPHAGE Take 1 tablet by mouth 2 (two) times daily.   omeprazole 40 MG capsule Commonly known as:  PRILOSEC Take 40 mg by mouth daily.   ramipril 10 MG capsule Commonly known as:  ALTACE Take 1 capsule by mouth daily.   verapamil 180 MG 24 hr capsule Commonly known as:  VERELAN PM Take 1 capsule by mouth daily.            Durable Medical Equipment  (From admission, onward)         Start     Ordered   07/30/18 1139  For home use only DME Nebulizer machine  Once    Question:  Patient needs a nebulizer to treat with the following condition  Answer:  Asthma   07/30/18 1138          No Known Allergies  Consultations:  none   Procedures/Studies: Ct Angio Chest Pe W And/or Wo Contrast  Result Date: 07/29/2018 CLINICAL DATA:  Shortness of breath and chest pain EXAM: CT ANGIOGRAPHY CHEST WITH CONTRAST TECHNIQUE: Multidetector CT imaging of the chest was performed using the standard protocol during bolus administration of intravenous contrast. Multiplanar CT image reconstructions and MIPs were obtained to evaluate the vascular anatomy. CONTRAST:  6mL OMNIPAQUE IOHEXOL 350 MG/ML SOLN COMPARISON:  Chest radiograph July 29, 2018 FINDINGS: Cardiovascular: There is no demonstrable pulmonary embolus. There is no thoracic aortic aneurysm or dissection. Visualized great vessels appear unremarkable. There is no appreciable pericardial effusion or pericardial thickening. Mediastinum/Nodes: There are nodular opacities throughout the thyroid, with the largest thyroid nodular lesion measuring just over 1 cm. There is no appreciable thoracic adenopathy.  No esophageal lesions are evident. Lungs/Pleura: There is no edema or consolidation. No pleural effusion or pleural thickening evident. Upper Abdomen: Gallbladder is absent. Visualized upper abdominal structures appear unremarkable. Musculoskeletal: There are no blastic or lytic bone lesions. No intramuscular lesions are evident. Review of the MIP images confirms the above findings. IMPRESSION: 1. No demonstrable pulmonary embolus. No thoracic aortic aneurysm or dissection. 2. Multinodular goiter without dominant mass. No further imaging surveillance is felt to be warranted based on consensus guidelines. 3.  Lungs clear. 4.  No appreciable thoracic adenopathy. 5.  Gallbladder absent. Electronically Signed   By: Lowella Grip III M.D.   On: 07/29/2018 20:43   Dg Chest Portable 1 View  Result Date: 07/29/2018 CLINICAL DATA:  Chest pain and shortness of breath EXAM: PORTABLE CHEST 1 VIEW COMPARISON:  None. FINDINGS: The lungs are clear. Heart size and pulmonary vascularity are normal. No adenopathy. No pneumothorax. No bone lesions. IMPRESSION: No edema or consolidation. Electronically Signed  By: Lowella Grip III M.D.   On: 07/29/2018 19:31      Subjective: She report constant chest pain, is worse on palpation. After the pain she gets SOB, and gets anxious. Also complaining of cough.  SOB intermittent. Oxygen on RA and on exertion 99 %  Discharge Exam: Vitals:   07/30/18 0800 07/30/18 1250  BP: 130/82   Pulse: 76   Resp: 14   Temp: 98.3 F (36.8 C)   SpO2: 99% 99%     General: Pt is alert, awake, not in acute distress Cardiovascular: RRR, S1/S2 +, no rubs, no gallops Respiratory: CTA bilaterally, no wheezing, no rhonchi Abdominal: Soft, NT, ND, bowel sounds + Extremities: no edema, no cyanosis    The results of significant diagnostics from this hospitalization (including imaging, microbiology, ancillary and laboratory) are listed below for reference.     Microbiology: No  results found for this or any previous visit (from the past 240 hour(s)).   Labs: BNP (last 3 results) No results for input(s): BNP in the last 8760 hours. Basic Metabolic Panel: Recent Labs  Lab 07/29/18 1929 07/30/18 0228  NA 136 137  K 3.9 3.8  CL 101 106  CO2 21* 24  GLUCOSE 249* 115*  BUN 11 13  CREATININE 0.84 0.65  CALCIUM 9.4 8.9   Liver Function Tests: Recent Labs  Lab 07/29/18 1929  AST 18  ALT 15  ALKPHOS 51  BILITOT 0.4  PROT 7.1  ALBUMIN 3.8   No results for input(s): LIPASE, AMYLASE in the last 168 hours. No results for input(s): AMMONIA in the last 168 hours. CBC: Recent Labs  Lab 07/29/18 1929 07/30/18 0228  WBC 13.5* 11.9*  NEUTROABS 10.6*  --   HGB 13.1 11.6*  HCT 41.0 35.4*  MCV 92.6 92.4  PLT 363 327   Cardiac Enzymes: Recent Labs  Lab 07/29/18 1929 07/30/18 0228 07/30/18 0702  TROPONINI <0.03 <0.03 <0.03   BNP: Invalid input(s): POCBNP CBG: Recent Labs  Lab 07/29/18 2245 07/30/18 0452 07/30/18 0739  GLUCAP 155* 103* 98   D-Dimer No results for input(s): DDIMER in the last 72 hours. Hgb A1c No results for input(s): HGBA1C in the last 72 hours. Lipid Profile No results for input(s): CHOL, HDL, LDLCALC, TRIG, CHOLHDL, LDLDIRECT in the last 72 hours. Thyroid function studies Recent Labs    07/29/18 2140  TSH 0.972   Anemia work up No results for input(s): VITAMINB12, FOLATE, FERRITIN, TIBC, IRON, RETICCTPCT in the last 72 hours. Urinalysis No results found for: COLORURINE, APPEARANCEUR, LABSPEC, [place], GLUCOSEU, HGBUR, BILIRUBINUR, KETONESUR, PROTEINUR, UROBILINOGEN, NITRITE, LEUKOCYTESUR Sepsis Labs Invalid input(s): PROCALCITONIN,  WBC,  LACTICIDVEN Microbiology No results found for this or any previous visit (from the past 240 hour(s)).   Time coordinating discharge: 40 minutes  SIGNED:   Elmarie Shiley, MD  [facility]

## 2018-08-01 LAB — NOVEL CORONAVIRUS, NAA (HOSP ORDER, SEND-OUT TO REF LAB; TAT 18-24 HRS): SARS-CoV-2, NAA: NOT DETECTED

## 2018-11-17 ENCOUNTER — Ambulatory Visit (HOSPITAL_COMMUNITY)
Admission: EM | Admit: 2018-11-17 | Discharge: 2018-11-17 | Disposition: A | Discharge status: Home / Self Care | Payer: 59 | Attending: Emergency Medicine | Admitting: Emergency Medicine

## 2018-11-17 ENCOUNTER — Encounter (HOSPITAL_COMMUNITY): Payer: Self-pay | Admitting: *Deleted

## 2018-11-17 ENCOUNTER — Other Ambulatory Visit: Payer: Self-pay

## 2018-11-17 DIAGNOSIS — J45909 Unspecified asthma, uncomplicated: Secondary | ICD-10-CM

## 2018-11-17 DIAGNOSIS — Z20828 Contact with and (suspected) exposure to other viral communicable diseases: Secondary | ICD-10-CM | POA: Insufficient documentation

## 2018-11-17 DIAGNOSIS — Z20822 Contact with and (suspected) exposure to covid-19: Secondary | ICD-10-CM

## 2018-11-17 HISTORY — DX: Unspecified ectopic pregnancy without intrauterine pregnancy: O00.90

## 2018-11-17 NOTE — ED Triage Notes (Signed)
Reports son (living in same household) tested positive for Covid.  Denies any sxs.

## 2018-11-17 NOTE — Discharge Instructions (Addendum)
COVID test pending: Important to stay home and isolate until test results are back. Return if you develop fever, cough, shortness of breath.

## 2018-11-17 NOTE — ED Provider Notes (Signed)
[facility]    CSN: [id number] Arrival date & time: 11/17/18  1017     History   Chief Complaint No chief complaint on file.   HPI Theresa Carter is a 48 y.o. female with history of asthma, type 2 diabetes, hypertension presenting for COVID testing.  States that her son, with whom she lives, tested positive on Saturday.  Patient currently asymptomatic, though concerned due to comorbidities: Admitted to hospital on 07/29/2018 due to bronchitis.  Denies hospitalization since and feels her asthma is currently well controlled with current medications.    Past Medical History:  Diagnosis Date  . Asthma   . Diabetes mellitus without complication ([place])   . Ectopic pregnancy   . Hypertension     Patient Active Problem List   Diagnosis Date Noted  . Chest pain 07/29/2018  . Essential hypertension 07/29/2018  . Asthma 07/29/2018  . Diabetes mellitus type II, non insulin dependent ([place]) 07/29/2018  . Chronic pain 07/29/2018    Past Surgical History:  Procedure Laterality Date  . CHOLECYSTECTOMY    . DILATION AND CURETTAGE OF UTERUS    . ENDOMETRIAL ABLATION    . HIP ARTHROSCOPY     x2  . SALPINGECTOMY    . TRIGGER FINGER RELEASE    . TUBAL LIGATION    . WRIST ARTHROSCOPY      OB History   No obstetric history on file.      Home Medications    Prior to Admission medications   Medication Sig Start Date End Date Taking? Authorizing Provider  Canagliflozin (INVOKANA PO) Take 1 tablet by mouth daily.   Yes [provider]  cyclobenzaprine (FLEXERIL) 10 MG tablet Take 1 tablet by mouth at bedtime.   Yes [provider]  HYDROcodone-acetaminophen (NORCO) 7.5-325 MG tablet Take 1 tablet by mouth every 6 (six) hours as needed for pain.   Yes [provider]  levocetirizine (XYZAL) 5 MG tablet Take 5 mg by mouth every evening.   Yes [provider]  liraglutide (VICTOZA) 18 MG/3ML SOPN Inject 0.6 mg into the skin daily.   Yes  [provider]  metFORMIN (GLUCOPHAGE) 1000 MG tablet Take 1 tablet by mouth 2 (two) times daily.   Yes [provider]  omeprazole (PRILOSEC) 40 MG capsule Take 40 mg by mouth daily.   Yes [provider]  ramipril (ALTACE) 10 MG capsule Take 1 capsule by mouth daily.   Yes [provider]  verapamil (VERELAN PM) 180 MG 24 hr capsule Take 1 capsule by mouth daily.   Yes [provider]  albuterol (PROVENTIL) (2.5 MG/3ML) 0.083% nebulizer solution Take 3 mLs (2.5 mg total) by nebulization every 6 (six) hours as needed for wheezing or shortness of breath. 07/30/18   Regalado, Jerald Kief A, MD  aspirin EC 81 MG EC tablet Take 1 tablet (81 mg total) by mouth daily. 07/31/18   Regalado, Belkys A, MD  guaiFENesin-dextromethorphan (ROBITUSSIN DM) 100-10 MG/5ML syrup Take 5 mLs by mouth every 4 (four) hours as needed for cough. 07/30/18   Regalado, Belkys A, MD  ibuprofen (ADVIL,MOTRIN) 400 MG tablet Take 1 tablet (400 mg total) by mouth every 6 (six) hours as needed for mild pain. 07/30/18   Regalado, Cassie Freer, MD    Family History Family History  Problem Relation Age of Onset  . Venous thrombosis Mother   . Heart attack Brother   . Venous thrombosis Son     Social History Social History  Tobacco Use  . Smoking status: Former Research scientist (life sciences)  . Smokeless tobacco: Never Used  Substance Use Topics  . Alcohol use: Yes    Comment: RARE  . Drug use: Never     Allergies   Patient has no known allergies.   Review of Systems Review of Systems  Constitutional: Negative for fatigue and fever.  HENT: Negative for ear pain, sinus pain, sore throat and voice change.   Eyes: Negative for pain, redness and visual disturbance.  Respiratory: Negative for cough and shortness of breath.   Cardiovascular: Negative for chest pain and palpitations.  Gastrointestinal: Negative for abdominal pain, diarrhea, nausea and vomiting.  Endocrine: Negative for polydipsia, polyphagia  and polyuria.  Genitourinary: Negative for difficulty urinating and frequency.  Musculoskeletal: Negative for arthralgias and myalgias.  Skin: Negative for rash and wound.  Neurological: Negative for syncope and headaches.     Physical Exam Triage Vital Signs ED Triage Vitals  Enc Vitals Group     BP      Pulse      Resp      Temp      Temp src      SpO2      Weight      Height      Head Circumference      Peak Flow      Pain Score      Pain Loc      Pain Edu?      Excl. in [place]?    No data found.  Updated Vital Signs BP 135/78   Pulse 79   Temp 98 F (36.7 C) (Temporal)   Resp 16   SpO2 98%   Visual Acuity Right Eye Distance:   Left Eye Distance:   Bilateral Distance:    Right Eye Near:   Left Eye Near:    Bilateral Near:     Physical Exam Constitutional:      General: She is not in acute distress. HENT:     Head: Normocephalic and atraumatic.  Eyes:     General: No scleral icterus.    Pupils: Pupils are equal, round, and reactive to light.  Cardiovascular:     Rate and Rhythm: Normal rate.  Pulmonary:     Effort: Pulmonary effort is normal.  Neurological:     Mental Status: She is alert and oriented to person, place, and time.      UC Treatments / Results  Labs (all labs ordered are listed, but only abnormal results are displayed) Labs Reviewed  NOVEL CORONAVIRUS, NAA (HOSPITAL ORDER, SEND-OUT TO REF LAB)    EKG   Radiology No results found.  Procedures Procedures (including critical care time)  Medications Ordered in UC Medications - No data to display  Initial Impression / Assessment and Plan / UC Course  I have reviewed the triage vital signs and the nursing notes.  Pertinent labs & imaging results that were available during my care of the patient were reviewed by me and considered in my medical decision making (see chart for details).      1.  Close exposure to COVID-19 Cover test pending, information provided and return  precautions were discussed.  2.  Asthma Currently well controlled with current medications: No changes today.  Return precautions discussed, patient verbalized understanding and is agreeable to plan. Final Clinical Impressions(s) / UC Diagnoses   Final diagnoses:  Close Exposure to [id number]-[id number] Virus  Uncomplicated asthma, unspecified asthma severity, unspecified whether persistent  Discharge Instructions     COVID test pending: Important to stay home and isolate until test results are back. Return if you develop fever, cough, shortness of breath.    ED Prescriptions    None     Controlled Substance Prescriptions [place] Controlled Substance Registry consulted? Not Applicable   Quincy Sheehan, [place] 11/17/18 [id number]

## 2018-11-18 LAB — NOVEL CORONAVIRUS, NAA (HOSP ORDER, SEND-OUT TO REF LAB; TAT 18-24 HRS): SARS-CoV-2, NAA: NOT DETECTED

## 2018-11-20 ENCOUNTER — Encounter (HOSPITAL_COMMUNITY): Payer: Self-pay

## 2018-11-30 ENCOUNTER — Ambulatory Visit (HOSPITAL_COMMUNITY)
Admission: EM | Admit: 2018-11-30 | Discharge: 2018-11-30 | Disposition: A | Discharge status: Home / Self Care | Payer: 59 | Attending: Family Medicine | Admitting: Family Medicine

## 2018-11-30 ENCOUNTER — Other Ambulatory Visit: Payer: Self-pay

## 2018-11-30 ENCOUNTER — Encounter (HOSPITAL_COMMUNITY): Payer: Self-pay

## 2018-11-30 DIAGNOSIS — Z20828 Contact with and (suspected) exposure to other viral communicable diseases: Secondary | ICD-10-CM

## 2018-11-30 DIAGNOSIS — R05 Cough: Secondary | ICD-10-CM

## 2018-11-30 DIAGNOSIS — I10 Essential (primary) hypertension: Secondary | ICD-10-CM | POA: Diagnosis not present

## 2018-11-30 DIAGNOSIS — Z20822 Contact with and (suspected) exposure to covid-19: Secondary | ICD-10-CM

## 2018-11-30 NOTE — ED Triage Notes (Signed)
Patient presents to Urgent Care with complaints of fever since this morning. Patient reports highest temp at home was 100.1, son just recovered from [place] and husband currently has it.

## 2018-11-30 NOTE — ED Provider Notes (Signed)
[facility]    CSN: [id number] Arrival date & time: 11/30/18  1349     History   Chief Complaint Chief Complaint  Patient presents with  . Fever    HPI Theresa Carter is a 48 y.o. female.   48 year old female with history of asthma, diabetes comes in for temperature of 100.1.  Patient has multiple COVID positive contact, with son recovering from [place], and had a recent negative test, and husband who was tested positive 11/17/2018, and had another positive test 11/28/2018 despite resolution of symptoms.  She has been checking her temperature periodically, and noted a temp of 100.1 this morning.  She has cough at baseline, denies worsening.  Denies rhinorrhea, nasal congestion.  States due to asthma, anxiety, she is constantly wondering if she is short of breath, or if it is hard to take a breath.  However, denies current shortness of breath.  Denies chills, body aches.  Denies abdominal pain, nausea, vomiting, diarrhea.  Denies loss of taste or smell.  They have tried to quarantine at home, with son and husband quarantined in their own rooms. Former smoker.      Past Medical History:  Diagnosis Date  . Asthma   . Diabetes mellitus without complication ([place])   . Ectopic pregnancy   . Hypertension     Patient Active Problem List   Diagnosis Date Noted  . Chest pain 07/29/2018  . Essential hypertension 07/29/2018  . Asthma 07/29/2018  . Diabetes mellitus type II, non insulin dependent ([place]) 07/29/2018  . Chronic pain 07/29/2018    Past Surgical History:  Procedure Laterality Date  . CHOLECYSTECTOMY    . DILATION AND CURETTAGE OF UTERUS    . ENDOMETRIAL ABLATION    . HIP ARTHROSCOPY     x2  . SALPINGECTOMY    . TRIGGER FINGER RELEASE    . TUBAL LIGATION    . WRIST ARTHROSCOPY      OB History   No obstetric history on file.      Home Medications    Prior to Admission medications   Medication Sig Start Date End Date Taking? Authorizing Provider   albuterol (PROVENTIL) (2.5 MG/3ML) 0.083% nebulizer solution Take 3 mLs (2.5 mg total) by nebulization every 6 (six) hours as needed for wheezing or shortness of breath. 07/30/18   Regalado, Jerald Kief A, MD  aspirin EC 81 MG EC tablet Take 1 tablet (81 mg total) by mouth daily. 07/31/18   Regalado, Belkys A, MD  Canagliflozin (INVOKANA PO) Take 1 tablet by mouth daily.    [provider]  cyclobenzaprine (FLEXERIL) 10 MG tablet Take 1 tablet by mouth at bedtime.    [provider]  guaiFENesin-dextromethorphan (ROBITUSSIN DM) 100-10 MG/5ML syrup Take 5 mLs by mouth every 4 (four) hours as needed for cough. 07/30/18   Regalado, Belkys A, MD  HYDROcodone-acetaminophen (NORCO) 7.5-325 MG tablet Take 1 tablet by mouth every 6 (six) hours as needed for pain.    [provider]  ibuprofen (ADVIL,MOTRIN) 400 MG tablet Take 1 tablet (400 mg total) by mouth every 6 (six) hours as needed for mild pain. 07/30/18   Regalado, Belkys A, MD  levocetirizine (XYZAL) 5 MG tablet Take 5 mg by mouth every evening.    [provider]  liraglutide (VICTOZA) 18 MG/3ML SOPN Inject 0.6 mg into the skin daily.    [provider]  metFORMIN (GLUCOPHAGE) 1000 MG tablet Take 1 tablet by mouth 2 (two) times daily.  [provider]  omeprazole (PRILOSEC) 40 MG capsule Take 40 mg by mouth daily.    [provider]  ramipril (ALTACE) 10 MG capsule Take 1 capsule by mouth daily.    [provider]  verapamil (VERELAN PM) 180 MG 24 hr capsule Take 1 capsule by mouth daily.    [provider]    Family History Family History  Problem Relation Age of Onset  . Venous thrombosis Mother   . Heart attack Brother   . Venous thrombosis Son     Social History Social History   Tobacco Use  . Smoking status: Former Research scientist (life sciences)  . Smokeless tobacco: Never Used  Substance Use Topics  . Alcohol use: Yes    Comment: RARE  . Drug use: Never     Allergies    Patient has no known allergies.   Review of Systems Review of Systems  Reason unable to perform ROS: See HPI as above.     Physical Exam Triage Vital Signs ED Triage Vitals  Enc Vitals Group     BP 11/30/18 1433 (!) 138/94     Pulse Rate 11/30/18 1433 (!) 105     Resp 11/30/18 1433 17     Temp 11/30/18 1433 98.3 F (36.8 C)     Temp Source 11/30/18 1433 Oral     SpO2 11/30/18 1433 98 %     Weight --      Height --      Head Circumference --      Peak Flow --      Pain Score 11/30/18 1431 0     Pain Loc --      Pain Edu? --      Excl. in [place]? --    No data found.  Updated Vital Signs BP (!) 138/94 (BP Location: Right Arm)   Pulse (!) 105   Temp 98.3 F (36.8 C) (Oral)   Resp 17   SpO2 98%   Physical Exam Constitutional:      General: She is not in acute distress.    Appearance: Normal appearance. She is not ill-appearing, toxic-appearing or diaphoretic.  HENT:     Head: Normocephalic and atraumatic.     Mouth/Throat:     Mouth: Mucous membranes are moist.     Pharynx: Oropharynx is clear. Uvula midline.  Neck:     Musculoskeletal: Normal range of motion and neck supple.  Cardiovascular:     Rate and Rhythm: Normal rate and regular rhythm.     Heart sounds: Normal heart sounds. No murmur. No friction rub. No gallop.      Comments: Resolved tachycardia.  Pulmonary:     Effort: Pulmonary effort is normal. No accessory muscle usage, prolonged expiration, respiratory distress or retractions.     Comments: Speaking in full sentences without difficulty.  Lungs clear to auscultation without adventitious lung sounds. Neurological:     General: No focal deficit present.     Mental Status: She is alert and oriented to person, place, and time.    UC Treatments / Results  Labs (all labs ordered are listed, but only abnormal results are displayed) Labs Reviewed  NOVEL CORONAVIRUS, NAA (HOSPITAL ORDER, SEND-OUT TO REF LAB)    EKG   Radiology No results found.   Procedures Procedures (including critical care time)  Medications Ordered in UC Medications - No data to display  Initial Impression / Assessment and Plan / UC Course  I have reviewed the triage vital signs and  the nursing notes.  Pertinent labs & imaging results that were available during my care of the patient were reviewed by me and considered in my medical decision making (see chart for details).    No alarming signs on exam.  Patient with resolved tachycardia on reexamination.  She is afebrile, without hypoxia, lungs clear to auscultation bilaterally.  However, given multiple exposures, will repeat COVID testing at this time.  Reassurance provided.  Patient to return to quarantine, return precautions discussed.  Patient expresses understanding and agrees to plan.  Patient have pulse ox at home, discussed normal ranges and to continue to monitor.  Final Clinical Impressions(s) / UC Diagnoses   Final diagnoses:  Exposure to Covid-19 Virus    ED Prescriptions    None        Ok Edwards, PA-C 11/30/18 1623

## 2018-11-30 NOTE — Discharge Instructions (Signed)
Currently, no alarming signs. Testing ordered. I would like you to quarantine until testing results. If experiencing shortness of breath, trouble breathing, call 911 and provide them with your current situation.

## 2018-12-01 LAB — NOVEL CORONAVIRUS, NAA (HOSP ORDER, SEND-OUT TO REF LAB; TAT 18-24 HRS): SARS-CoV-2, NAA: NOT DETECTED

## 2018-12-02 ENCOUNTER — Encounter (HOSPITAL_COMMUNITY): Payer: Self-pay

## 2018-12-03 ENCOUNTER — Ambulatory Visit (HOSPITAL_COMMUNITY)
Admission: EM | Admit: 2018-12-03 | Discharge: 2018-12-03 | Disposition: A | Discharge status: Home / Self Care | Payer: 59 | Attending: Family Medicine | Admitting: Family Medicine

## 2018-12-03 ENCOUNTER — Other Ambulatory Visit: Payer: Self-pay

## 2018-12-03 ENCOUNTER — Encounter (HOSPITAL_COMMUNITY): Payer: Self-pay

## 2018-12-03 DIAGNOSIS — S46811A Strain of other muscles, fascia and tendons at shoulder and upper arm level, right arm, initial encounter: Secondary | ICD-10-CM | POA: Diagnosis not present

## 2018-12-03 DIAGNOSIS — M79601 Pain in right arm: Secondary | ICD-10-CM

## 2018-12-03 MED ORDER — IBUPROFEN 800 MG PO TABS: 800.0000 mg | ORAL_TABLET | Freq: Three times a day (TID) | ORAL | 0 refills | Status: DC

## 2018-12-03 MED ORDER — DEXAMETHASONE SODIUM PHOSPHATE 10 MG/ML IJ SOLN
INTRAMUSCULAR | Status: AC
  Filled 2018-12-03: qty 1

## 2018-12-03 MED ORDER — PREDNISONE 50 MG PO TABS: 50.0000 mg | ORAL_TABLET | Freq: Every day | ORAL | 0 refills | Status: AC

## 2018-12-03 MED ORDER — DEXAMETHASONE SODIUM PHOSPHATE 10 MG/ML IJ SOLN
10.0000 mg | Freq: Once | INTRAMUSCULAR | Status: AC
  Administered 2018-12-03: 10 mg via INTRAMUSCULAR

## 2018-12-03 MED ORDER — KETOROLAC TROMETHAMINE 60 MG/2ML IM SOLN
30.0000 mg | Freq: Once | INTRAMUSCULAR | Status: AC
  Administered 2018-12-03: 30 mg via INTRAMUSCULAR

## 2018-12-03 MED ORDER — KETOROLAC TROMETHAMINE 30 MG/ML IJ SOLN
INTRAMUSCULAR | Status: AC
  Filled 2018-12-03: qty 1

## 2018-12-03 NOTE — Discharge Instructions (Signed)
We gave you Toradol and Decadron today. Please continue with prednisone daily for the next 5 days, please take with food and in the morning, this will increase your blood sugars temporarily, please monitor Continue using Flexeril After finishing course of prednisone you may take Tylenol and ibuprofen together I sent a referral to physical therapy.  They should contact you to set up an appointment.  If symptoms still persisting please follow-up with orthopedics for further evaluation/imaging. May try [facility], [facility], [facility] or [facility]
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
## 2018-12-03 NOTE — ED Triage Notes (Signed)
Pt states she has pain in her right shoulder, arm and neck . X 2 months now.

## 2018-12-16 ENCOUNTER — Ambulatory Visit: Payer: 59 | Attending: Pediatrics | Admitting: Physical Therapy

## 2018-12-16 ENCOUNTER — Other Ambulatory Visit: Payer: Self-pay

## 2018-12-16 ENCOUNTER — Encounter: Payer: Self-pay | Admitting: Physical Therapy

## 2018-12-16 DIAGNOSIS — M542 Cervicalgia: Secondary | ICD-10-CM

## 2018-12-16 DIAGNOSIS — E119 Type 2 diabetes mellitus without complications: Secondary | ICD-10-CM | POA: Diagnosis not present

## 2018-12-16 DIAGNOSIS — M79601 Pain in right arm: Secondary | ICD-10-CM

## 2018-12-16 DIAGNOSIS — J45909 Unspecified asthma, uncomplicated: Secondary | ICD-10-CM | POA: Diagnosis not present

## 2018-12-16 DIAGNOSIS — M6281 Muscle weakness (generalized): Secondary | ICD-10-CM

## 2018-12-16 DIAGNOSIS — R293 Abnormal posture: Secondary | ICD-10-CM

## 2018-12-16 DIAGNOSIS — G8929 Other chronic pain: Secondary | ICD-10-CM | POA: Diagnosis not present

## 2018-12-16 DIAGNOSIS — I10 Essential (primary) hypertension: Secondary | ICD-10-CM | POA: Diagnosis not present

## 2018-12-16 DIAGNOSIS — Z9049 Acquired absence of other specified parts of digestive tract: Secondary | ICD-10-CM | POA: Diagnosis not present
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
## 2018-12-18 ENCOUNTER — Ambulatory Visit: Payer: 59 | Admitting: Physical Therapy

## 2018-12-18 ENCOUNTER — Encounter: Payer: Self-pay | Admitting: Physical Therapy

## 2018-12-18 ENCOUNTER — Other Ambulatory Visit: Payer: Self-pay

## 2018-12-18 DIAGNOSIS — M6281 Muscle weakness (generalized): Secondary | ICD-10-CM

## 2018-12-18 DIAGNOSIS — M79601 Pain in right arm: Secondary | ICD-10-CM | POA: Diagnosis not present

## 2018-12-18 DIAGNOSIS — M542 Cervicalgia: Secondary | ICD-10-CM

## 2018-12-18 DIAGNOSIS — R293 Abnormal posture: Secondary | ICD-10-CM

## 2018-12-18 NOTE — Therapy (Signed)
[place], [place], [address] Phone: [phone]   Fax:  [phone]  Physical Therapy Treatment  Patient Details  Name: Theresa Carter MRN: [id number] Date of Birth: 10-26-1970 Referring Provider (PT): Delcie Roch, MD   Encounter Date: 12/18/2018  PT End of Session - 12/18/18 0809    Visit Number  2    Number of Visits  13    Date for PT Re-Evaluation  01/30/19    Authorization Type  UHC    PT Start Time  0806    PT Stop Time  0845    PT Time Calculation (min)  39 min    Activity Tolerance  Patient tolerated treatment well    Behavior During Therapy  [facility] for tasks assessed/performed       Past Medical History:  Diagnosis Date  . Asthma   . Diabetes mellitus without complication ([place])   . Ectopic pregnancy   . Hypertension     Past Surgical History:  Procedure Laterality Date  . CHOLECYSTECTOMY    . DILATION AND CURETTAGE OF UTERUS    . ENDOMETRIAL ABLATION    . HIP ARTHROSCOPY     x2  . SALPINGECTOMY    . TRIGGER FINGER RELEASE    . TUBAL LIGATION    . WRIST ARTHROSCOPY      There were no vitals filed for this visit.  Subjective Assessment - 12/18/18 0810    Subjective  I tried to sit up but it was really hard, I kept correcting.    Currently in Pain?  Yes    Pain Score  8     Pain Location  Shoulder    Pain Orientation  Right    Pain Descriptors / Indicators  Sore    Pain Radiating Towards  Rt UE         [facility] PT Assessment - 12/18/18 0001      AROM   Cervical - Right Rotation  56    Cervical - Left Rotation  64      Strength   Right Shoulder Flexion  3-/5   unable to lift through full available ROM, active flex <90                  [facility] Adult PT Treatment/Exercise - 12/18/18 0001      Exercises   Exercises  Shoulder      Shoulder Exercises: Seated   Flexion Limitations  AAROM flx to W arms stretch    Other Seated Exercises  cervical  AROM rotation & sidebend      Shoulder Exercises: Stretch   Other Shoulder Stretches  upper trap/first rib mob with sheet      Manual Therapy   Manual Therapy  Soft tissue mobilization    Manual therapy comments  skilled palpation and monitoring during TPDN    Soft tissue mobilization  Rt upper trap, pectoralis, tennis ball to pectoralis       Trigger Point Dry Needling - 12/18/18 0001    Consent Given?  Yes    Muscles Treated Head and Neck  Upper trapezius;Levator scapulae    Upper Trapezius Response  Twitch reponse elicited;Palpable increased muscle length   Right   Levator Scapulae Response  Twitch response elicited;Palpable increased muscle length   left          PT Education - 12/18/18 0845    Education Details  TPDN & expected outcomes    Person(s) Educated  Patient    Methods  Explanation    Comprehension  Verbalized understanding;Need further instruction       PT Short Term Goals - 12/16/18 1315      PT SHORT TERM GOAL #1   Title  pt will be aware of her posture during day and understand how to correct    Baseline  began educating at eval    Time  3    Period  Weeks    Status  New    Target Date  01/06/19        PT Long Term Goals - 12/16/18 1317      PT LONG TERM GOAL #1   Title  Rt UE strength 5/5    Baseline  see flowsheet    Time  6    Period  Weeks    Status  New    Target Date  01/30/19      PT LONG TERM GOAL #2   Title  cervical rotation within 5 deg Rt to Lt    Baseline  see flowsheet    Time  6    Period  Weeks    Status  New    Target Date  01/30/19      PT LONG TERM GOAL #3   Title  Resolution of dropping objects in right hand    Baseline  drops frequently at eval    Time  6    Period  Weeks    Status  New    Target Date  01/30/19      PT LONG TERM GOAL #4   Title  FOTO to 49% limitation    Baseline  70% limited at eval    Time  6    Period  Weeks    Status  New    Target Date  01/30/19            Plan -  12/18/18 0844    Clinical Impression Statement  Good tolerance to dry needling with notable improvements in postural alignment. GHJ has full PROM but actively limited to approx 90 due to weakness.    PT Treatment/Interventions  ADLs/Self Care Home Management;Cryotherapy;Electrical Stimulation;Ultrasound;Traction;Moist Heat;Iontophoresis 4mg /ml Dexamethasone;Therapeutic activities;Therapeutic exercise;Neuromuscular re-education;Manual techniques;Patient/family education;Passive range of motion;Dry needling;Taping    PT Next Visit Plan  continue DN PRN- pectoralis, subscap    PT Home Exercise Plan  scap retraction, upright posture, upper trap/first rib with sheet, cervical AROM    Consulted and Agree with Plan of Care  Patient       Patient will benefit from skilled therapeutic intervention in order to improve the following deficits and impairments:  Decreased range of motion, Difficulty walking, Impaired UE functional use, Increased muscle spasms, Decreased activity tolerance, Pain, Improper body mechanics, Decreased strength, Postural dysfunction, Impaired sensation  Visit Diagnosis: Pain in right arm  Cervicalgia  Abnormal posture  Muscle weakness (generalized)     Problem List Patient Active Problem List   Diagnosis Date Noted  . Chest pain 07/29/2018  . Essential hypertension 07/29/2018  . Asthma 07/29/2018  . Diabetes mellitus type II, non insulin dependent ([place]) 07/29/2018  . Chronic pain 07/29/2018    Kharlie Bring C. Brooks Stotz PT, DPT 12/18/18 10:13 AM   [facility] [address] [place], [place], [address] Phone: [phone]   Fax:  [phone]  Name: Aryanah Provance MRN: [id number] Date of Birth: December 14, 1970

## 2018-12-22 ENCOUNTER — Encounter: Payer: Self-pay | Admitting: Physical Therapy

## 2018-12-22 ENCOUNTER — Other Ambulatory Visit: Payer: Self-pay

## 2018-12-22 ENCOUNTER — Ambulatory Visit: Payer: 59 | Admitting: Physical Therapy

## 2018-12-22 DIAGNOSIS — M6281 Muscle weakness (generalized): Secondary | ICD-10-CM

## 2018-12-22 DIAGNOSIS — M79601 Pain in right arm: Secondary | ICD-10-CM | POA: Diagnosis not present

## 2018-12-22 DIAGNOSIS — R293 Abnormal posture: Secondary | ICD-10-CM

## 2018-12-22 DIAGNOSIS — M542 Cervicalgia: Secondary | ICD-10-CM

## 2018-12-22 NOTE — Therapy (Signed)
[place], [place], [address] Phone: [phone]   Fax:  [phone]  Physical Therapy Treatment  Patient Details  Name: Theresa Carter MRN: [id number] Date of Birth: [birth year] Referring Provider (PT): Alcario Drought Merrily Pew, MD   Encounter Date: 12/22/2018  PT End of Session - 12/22/18 1254    Visit Number  3    Number of Visits  13    Date for PT Re-Evaluation  01/30/19    Authorization Type  UHC    PT Start Time  1045    PT Stop Time  1130    PT Time Calculation (min)  45 min    Activity Tolerance  Patient tolerated treatment well    Behavior During Therapy  [facility] for tasks assessed/performed       Past Medical History:  Diagnosis Date  . Asthma   . Diabetes mellitus without complication ([place])   . Ectopic pregnancy   . Hypertension     Past Surgical History:  Procedure Laterality Date  . CHOLECYSTECTOMY    . DILATION AND CURETTAGE OF UTERUS    . ENDOMETRIAL ABLATION    . HIP ARTHROSCOPY     x2  . SALPINGECTOMY    . TRIGGER FINGER RELEASE    . TUBAL LIGATION    . WRIST ARTHROSCOPY      There were no vitals filed for this visit.  Subjective Assessment - 12/22/18 1057    Subjective  Pt reporting pain of 6-7/10 in R upper trap/shoulder. Pt reporting soreness following dry needling for at least two days. Pt did report improvements in ROM. Overall pt's believe the DN helped.    How long can you stand comfortably?  immediate discomfort    Currently in Pain?  Yes    Pain Score  7     Pain Location  Shoulder    Pain Orientation  Right    Pain Descriptors / Indicators  Aching;Sore    Pain Type  Acute pain    Pain Onset  More than a month ago                       [facility] Adult PT Treatment/Exercise - 12/22/18 0001      Shoulder Exercises: Supine   Other Supine Exercises  thoracic spine stretch with towel roll vertically holding 5 minutes with UE abducted to 90 degrees.      Other Supine Exercises  cervical retraction x 10 holding 5 seconds with chin tuck      Shoulder Exercises: Seated   Row  Strengthening;Both;10 reps;Theraband    Theraband Level (Shoulder Row)  Level 3 (Green)    Other Seated Exercises  cervical AROM rotation & sidebend    Other Seated Exercises  Upper trap stretch x 2 holding 10 seconds each side      Modalities   Modalities  Moist Heat      Moist Heat Therapy   Number Minutes Moist Heat  10 Minutes    Moist Heat Location  Cervical      Manual Therapy   Manual Therapy  Soft tissue mobilization    Manual therapy comments  manual trigger point release    15 minutes   Soft tissue mobilization  R upper trap and medial scapular border, active trigger points noted   Biofreeze              PT Short Term Goals - 12/22/18 1115  PT SHORT TERM GOAL #1   Title  pt will be aware of her posture during day and understand how to correct    Baseline  began educating at eval    Time  3    Period  Weeks    Status  On-going        PT Long Term Goals - 12/22/18 1115      PT LONG TERM GOAL #1   Title  Rt UE strength 5/5    Baseline  see flowsheet    Time  6    Period  Weeks    Status  New      PT LONG TERM GOAL #2   Title  cervical rotation within 5 deg Rt to Lt    Baseline  see flowsheet    Time  6    Period  Weeks    Status  New      PT LONG TERM GOAL #3   Title  Resolution of dropping objects in right hand    Baseline  drops frequently at eval    Time  6    Period  Weeks    Status  New      PT LONG TERM GOAL #4   Title  FOTO to 49% limitation    Time  6    Period  Weeks    Status  New            Plan - 12/22/18 1104    Clinical Impression Statement  Pt arriving today reporting 7/10 pain in R upper trap. Pt reporting good response to dry needling and wishes to try it again on 12/30/2018. Pt tolerating exericses well. Pt was issued a green theraband to add rows to ther HEP. Pt with active trigger  points noted in R upper trap and medial scpular border with soft tissue mobilization. Continue skilled PT to progress toward goals sets.    Personal Factors and Comorbidities  Comorbidity 1    Comorbidities  h/o cervical herniation & nerve damage (per pt)    Examination-Activity Limitations  Reach Overhead;Bed Mobility;Bend;Sleep;Carry;Dressing;Stand;Lift    Examination-Participation Restrictions  Meal Prep;Cleaning;Community Activity;Driving;Laundry    Stability/Clinical Decision Making  Evolving/Moderate complexity    Rehab Potential  Good    PT Frequency  2x / week    PT Duration  6 weeks    PT Treatment/Interventions  ADLs/Self Care Home Management;Cryotherapy;Electrical Stimulation;Ultrasound;Traction;Moist Heat;Iontophoresis 4mg /ml Dexamethasone;Therapeutic activities;Therapeutic exercise;Neuromuscular re-education;Manual techniques;Patient/family education;Passive range of motion;Dry needling;Taping    PT Next Visit Plan  continue DN PRN on 12/30/2018- pectoralis, subscap    PT Home Exercise Plan  scap retraction, upright posture, upper trap/first rib with sheet, cervical AROM, rows    Consulted and Agree with Plan of Care  Patient       Patient will benefit from skilled therapeutic intervention in order to improve the following deficits and impairments:  Decreased range of motion, Difficulty walking, Impaired UE functional use, Increased muscle spasms, Decreased activity tolerance, Pain, Improper body mechanics, Decreased strength, Postural dysfunction, Impaired sensation  Visit Diagnosis: Cervicalgia  Pain in right arm  Abnormal posture  Muscle weakness (generalized)     Problem List Patient Active Problem List   Diagnosis Date Noted  . Chest pain 07/29/2018  . Essential hypertension 07/29/2018  . Asthma 07/29/2018  . Diabetes mellitus type II, non insulin dependent ([place]) 07/29/2018  . Chronic pain 07/29/2018    Oretha Caprice, PT 12/22/2018, 12:56 PM  [facility] [address]  [place], [place], [address] Phone: [phone]   Fax:  [phone]  Name: Theresa Carter MRN: [id number] Date of Birth: 08-19-1970

## 2018-12-25 ENCOUNTER — Ambulatory Visit: Payer: 59 | Attending: Internal Medicine | Admitting: Physical Therapy

## 2018-12-25 ENCOUNTER — Other Ambulatory Visit: Payer: Self-pay

## 2018-12-25 ENCOUNTER — Encounter: Payer: Self-pay | Admitting: Physical Therapy

## 2018-12-25 DIAGNOSIS — M79601 Pain in right arm: Secondary | ICD-10-CM | POA: Diagnosis present

## 2018-12-25 DIAGNOSIS — M542 Cervicalgia: Secondary | ICD-10-CM | POA: Insufficient documentation

## 2018-12-25 DIAGNOSIS — M6281 Muscle weakness (generalized): Secondary | ICD-10-CM | POA: Insufficient documentation

## 2018-12-25 DIAGNOSIS — R293 Abnormal posture: Secondary | ICD-10-CM | POA: Insufficient documentation

## 2018-12-25 NOTE — Therapy (Signed)
[place], [place], [address] Phone: [phone]   Fax:  [phone]  Physical Therapy Treatment  Patient Details  Name: Theresa Carter MRN: [id number] Date of Birth: 1970/04/26 Referring Provider (PT): Delcie Roch, MD   Encounter Date: 12/25/2018  PT End of Session - 12/25/18 [id number]    Visit Number  4    Number of Visits  13    Date for PT Re-Evaluation  01/30/19    Authorization Type  UHC    PT Start Time  [id number]    PT Stop Time  [id number]    PT Time Calculation (min)  50 min       Past Medical History:  Diagnosis Date  . Asthma   . Diabetes mellitus without complication ([place])   . Ectopic pregnancy   . Hypertension     Past Surgical History:  Procedure Laterality Date  . CHOLECYSTECTOMY    . DILATION AND CURETTAGE OF UTERUS    . ENDOMETRIAL ABLATION    . HIP ARTHROSCOPY     x2  . SALPINGECTOMY    . TRIGGER FINGER RELEASE    . TUBAL LIGATION    . WRIST ARTHROSCOPY      There were no vitals filed for this visit.  Subjective Assessment - 12/25/18 1154    Subjective  PT reports pain today in right neck and shoulder.    Currently in Pain?  Yes    Pain Score  6     Pain Location  Shoulder    Pain Orientation  Right    Pain Descriptors / Indicators  Aching    Aggravating Factors   constant    Pain Relieving Factors  meds         [facility] PT Assessment - 12/25/18 0001      AROM   Cervical - Right Rotation  65    Cervical - Left Rotation  65                   [facility] Adult PT Treatment/Exercise - 12/25/18 0001      Shoulder Exercises: Supine   Horizontal ABduction  10 reps   2 sets   Theraband Level (Shoulder Horizontal ABduction)  Level 2 (Red)    External Rotation  10 reps   2 sets   Theraband Level (Shoulder External Rotation)  Level 2 (Red)    Flexion Limitations  AAROM with cane     Other Supine Exercises  1/2 foam roller pec stretch , then with AROM  horizontal abduct and adduct     Other Supine Exercises  cervical retraction x 10 holding 5 seconds with chin tuck      Shoulder Exercises: Seated   Flexion Limitations  AAROM flx to W arms stretch    Other Seated Exercises  cervical AROM rotation & sidebend    Other Seated Exercises  upper trap and levator stretches       Shoulder Exercises: Standing   Row  20 reps    Theraband Level (Shoulder Row)  Level 3 (Green)      Modalities   Modalities  Electrical Stimulation      Moist Heat Therapy   Number Minutes Moist Heat  15 Minutes    Moist Heat Location  Cervical      Electrical Stimulation   Electrical Stimulation Location  Right uppertrap and shoulder    Electrical Stimulation Action  IFC x 15 minutes     Electrical  Stimulation Parameters  42ma    Electrical Stimulation Goals  Pain             PT Education - 12/25/18 1233    Education Details  HEP    Person(s) Educated  Patient    Methods  Explanation;Handout    Comprehension  Verbalized understanding       PT Short Term Goals - 12/22/18 1115      PT SHORT TERM GOAL #1   Title  pt will be aware of her posture during day and understand how to correct    Baseline  began educating at eval    Time  3    Period  Weeks    Status  On-going        PT Long Term Goals - 12/22/18 1115      PT LONG TERM GOAL #1   Title  Rt UE strength 5/5    Baseline  see flowsheet    Time  6    Period  Weeks    Status  New      PT LONG TERM GOAL #2   Title  cervical rotation within 5 deg Rt to Lt    Baseline  see flowsheet    Time  6    Period  Weeks    Status  New      PT LONG TERM GOAL #3   Title  Resolution of dropping objects in right hand    Baseline  drops frequently at eval    Time  6    Period  Weeks    Status  New      PT LONG TERM GOAL #4   Title  FOTO to 49% limitation    Time  6    Period  Weeks    Status  New            Plan - 12/25/18 1233    Clinical Impression Statement  Pt reports 6-7/10  pain in right neck and shoulder into deltoid. Her Cervical AROM is improved. Continue with pec stretching and scapular strengthening. Reviewed Cervical AROM and stretching. Sore, achey and fatigue reported after therex at 8/10 in right neck and shoulder. Trial of IFC and HMP to decrease pain. Will assess response next session.    PT Next Visit Plan  continue DN PRN on 12/30/2018- pectoralis, subscap; check response to IFC    PT Home Exercise Plan  scap retraction, upright posture, upper trap/first rib with sheet, cervical AROM, green band rows, supine horizontal abduction and ER with red band       Patient will benefit from skilled therapeutic intervention in order to improve the following deficits and impairments:  Decreased range of motion, Difficulty walking, Impaired UE functional use, Increased muscle spasms, Decreased activity tolerance, Pain, Improper body mechanics, Decreased strength, Postural dysfunction, Impaired sensation  Visit Diagnosis: Cervicalgia  Pain in right arm  Abnormal posture  Muscle weakness (generalized)     Problem List Patient Active Problem List   Diagnosis Date Noted  . Chest pain 07/29/2018  . Essential hypertension 07/29/2018  . Asthma 07/29/2018  . Diabetes mellitus type II, non insulin dependent ([place]) 07/29/2018  . Chronic pain 07/29/2018    Dorene Ar, PTA 12/25/2018, 12:54 PM  [facility] [address] [place], [place], [address] Phone: [phone]   Fax:  [phone]  Name: Theresa Carter MRN: [id number] Date of Birth: January 07, 1971

## 2018-12-30 ENCOUNTER — Encounter: Payer: Self-pay | Admitting: Physical Therapy

## 2018-12-30 ENCOUNTER — Ambulatory Visit: Payer: 59 | Admitting: Physical Therapy

## 2018-12-30 ENCOUNTER — Other Ambulatory Visit: Payer: Self-pay

## 2018-12-30 DIAGNOSIS — R293 Abnormal posture: Secondary | ICD-10-CM

## 2018-12-30 DIAGNOSIS — M79601 Pain in right arm: Secondary | ICD-10-CM

## 2018-12-30 DIAGNOSIS — M6281 Muscle weakness (generalized): Secondary | ICD-10-CM

## 2018-12-30 DIAGNOSIS — M542 Cervicalgia: Secondary | ICD-10-CM

## 2018-12-30 NOTE — Therapy (Signed)
[place], [place], [address] Phone: [phone]   Fax:  [phone]  Physical Therapy Treatment  Patient Details  Name: Theresa Carter MRN: [id number] Date of Birth: 1970/05/24 Referring Provider (PT): Alcario Drought Merrily Pew, MD   Encounter Date: 12/30/2018  PT End of Session - 12/30/18 1422    Visit Number  5    Number of Visits  13    Date for PT Re-Evaluation  01/30/19    Authorization Type  UHC    PT Start Time  [id number]    PT Stop Time  1510    PT Time Calculation (min)  53 min    Activity Tolerance  Patient tolerated treatment well    Behavior During Therapy  [facility] for tasks assessed/performed       Past Medical History:  Diagnosis Date  . Asthma   . Diabetes mellitus without complication ([place])   . Ectopic pregnancy   . Hypertension     Past Surgical History:  Procedure Laterality Date  . CHOLECYSTECTOMY    . DILATION AND CURETTAGE OF UTERUS    . ENDOMETRIAL ABLATION    . HIP ARTHROSCOPY     x2  . SALPINGECTOMY    . TRIGGER FINGER RELEASE    . TUBAL LIGATION    . WRIST ARTHROSCOPY      There were no vitals filed for this visit.  Subjective Assessment - 12/30/18 1420    Subjective  Reports Rt deltoid hurts laterally but less N/T into UE. Felt really good after manual therapy to Rt upper trap. A little better sleep.    Currently in Pain?  Yes    Pain Score  5     Pain Location  Shoulder    Pain Orientation  Right;Lateral    Pain Descriptors / Indicators  Sore         [facility] PT Assessment - 12/30/18 0001      Assessment   Medical Diagnosis  Rt shoulder OA, biceps tendinitis    Referring Provider (PT)  Delcie Roch, MD                   [facility] Adult PT Treatment/Exercise - 12/30/18 0001      Shoulder Exercises: Seated   External Rotation  15 reps;AROM    Other Seated Exercises  scapular retraction + rotation, sidebend      Shoulder Exercises: Stretch    Other Shoulder Stretches  open book      Moist Heat Therapy   Number Minutes Moist Heat  15 Minutes    Moist Heat Location  Shoulder   Rt, with IFC     Electrical Stimulation   Electrical Stimulation Location  Rt GHJ with heat    Electrical Stimulation Action  IFC 16 min    Electrical Stimulation Parameters  to tolerance     Electrical Stimulation Goals  Pain      Manual Therapy   Manual Therapy  Joint mobilization    Joint Mobilization  C0 on C1    Soft tissue mobilization  Rt pec major & minor; suboccipital release, bil upper trap             PT Education - 12/30/18 1700    Education Details  resting posture & alignment    Person(s) Educated  Patient    Methods  Explanation    Comprehension  Verbalized understanding;Need further instruction       PT Short Term Goals -  12/22/18 1115      PT SHORT TERM GOAL #1   Title  pt will be aware of her posture during day and understand how to correct    Baseline  began educating at eval    Time  3    Period  Weeks    Status  On-going        PT Long Term Goals - 12/22/18 1115      PT LONG TERM GOAL #1   Title  Rt UE strength 5/5    Baseline  see flowsheet    Time  6    Period  Weeks    Status  New      PT LONG TERM GOAL #2   Title  cervical rotation within 5 deg Rt to Lt    Baseline  see flowsheet    Time  6    Period  Weeks    Status  New      PT LONG TERM GOAL #3   Title  Resolution of dropping objects in right hand    Baseline  drops frequently at eval    Time  6    Period  Weeks    Status  New      PT LONG TERM GOAL #4   Title  FOTO to 49% limitation    Time  6    Period  Weeks    Status  New            Plan - 12/30/18 1500    Clinical Impression Statement  Rests at in a slight Lt cervical rotation and Rt sidebend due to tightness of upper traps. Good tolerance to treatment but did not want DN today. Pectoralis tightness reduced but is still pulling Rt GHJ fwd.    PT  Treatment/Interventions  ADLs/Self Care Home Management;Cryotherapy;Electrical Stimulation;Ultrasound;Traction;Moist Heat;Iontophoresis 4mg /ml Dexamethasone;Therapeutic activities;Therapeutic exercise;Neuromuscular re-education;Manual techniques;Patient/family education;Passive range of motion;Dry needling;Taping    PT Next Visit Plan  DN PRN, pec stretching, prone periscap    PT Home Exercise Plan  scap retraction, upright posture, upper trap/first rib with sheet, cervical AROM, green band rows, supine horizontal abduction and ER with red band    Consulted and Agree with Plan of Care  Patient       Patient will benefit from skilled therapeutic intervention in order to improve the following deficits and impairments:  Decreased range of motion, Difficulty walking, Impaired UE functional use, Increased muscle spasms, Decreased activity tolerance, Pain, Improper body mechanics, Decreased strength, Postural dysfunction, Impaired sensation  Visit Diagnosis: Cervicalgia  Pain in right arm  Abnormal posture  Muscle weakness (generalized)     Problem List Patient Active Problem List   Diagnosis Date Noted  . Chest pain 07/29/2018  . Essential hypertension 07/29/2018  . Asthma 07/29/2018  . Diabetes mellitus type II, non insulin dependent ([place]) 07/29/2018  . Chronic pain 07/29/2018    Nixie Laube C. Kloi Brodman PT, DPT 12/30/18 5:01 PM   [facility] [address] [place], [place], [address] Phone: [phone]   Fax:  [phone]  Name: Quinasia Gussler MRN: [id number] Date of Birth: 1970-08-12

## 2019-01-01 ENCOUNTER — Ambulatory Visit: Payer: 59 | Admitting: Physical Therapy

## 2019-01-01 ENCOUNTER — Other Ambulatory Visit: Payer: Self-pay

## 2019-01-01 ENCOUNTER — Encounter: Payer: Self-pay | Admitting: Physical Therapy

## 2019-01-01 DIAGNOSIS — M79601 Pain in right arm: Secondary | ICD-10-CM

## 2019-01-01 DIAGNOSIS — R293 Abnormal posture: Secondary | ICD-10-CM

## 2019-01-01 DIAGNOSIS — M6281 Muscle weakness (generalized): Secondary | ICD-10-CM

## 2019-01-01 DIAGNOSIS — M542 Cervicalgia: Secondary | ICD-10-CM

## 2019-01-01 NOTE — Therapy (Signed)
[place], [place], [address] Phone: [phone]   Fax:  [phone]  Physical Therapy Treatment  Patient Details  Name: Theresa Carter MRN: [id number] Date of Birth: Oct 31, 1970 Referring Provider (PT): Alcario Drought Merrily Pew, MD   Encounter Date: 01/01/2019  PT End of Session - 01/01/19 0930    Visit Number  6    Number of Visits  13    Date for PT Re-Evaluation  01/30/19    Authorization Type  UHC    PT Start Time  0930    PT Stop Time  1023    PT Time Calculation (min)  53 min    Activity Tolerance  Patient tolerated treatment well    Behavior During Therapy  [facility] for tasks assessed/performed       Past Medical History:  Diagnosis Date  . Asthma   . Diabetes mellitus without complication ([place])   . Ectopic pregnancy   . Hypertension     Past Surgical History:  Procedure Laterality Date  . CHOLECYSTECTOMY    . DILATION AND CURETTAGE OF UTERUS    . ENDOMETRIAL ABLATION    . HIP ARTHROSCOPY     x2  . SALPINGECTOMY    . TRIGGER FINGER RELEASE    . TUBAL LIGATION    . WRIST ARTHROSCOPY      There were no vitals filed for this visit.  Subjective Assessment - 01/01/19 0931    Subjective  Today is not a good day, the whole arm hurts and I dropped everything in my hand. Pectoralis is sore. Was good tuesday, gradually worsened over Wednesday and woke with pain today.    Currently in Pain?  Yes    Pain Score  7     Pain Location  Shoulder    Pain Orientation  Right    Pain Descriptors / Indicators  Sore    Aggravating Factors   constant                       [facility] Adult PT Treatment/Exercise - 01/01/19 0001      Shoulder Exercises: Prone   Retraction  15 reps    Retraction Limitations  tactile cues required    Extension  15 reps    Other Prone Exercises  prone row      Shoulder Exercises: Stretch   Cross Chest Stretch  1 rep;20 seconds    Other Shoulder Stretches   scalene stretch      Modalities   Modalities  Cryotherapy      Cryotherapy   Number Minutes Cryotherapy  10 Minutes    Cryotherapy Location  Shoulder    Type of Cryotherapy  Ice pack      Manual Therapy   Joint Mobilization  lateral cervical glides, gentle cervical traction    Soft tissue mobilization  Rt scalenes, upper trap, pec minor             PT Education - 01/01/19 1024    Education Details  referral patterns    Person(s) Educated  Patient    Methods  Explanation    Comprehension  Verbalized understanding       PT Short Term Goals - 12/22/18 1115      PT SHORT TERM GOAL #1   Title  pt will be aware of her posture during day and understand how to correct    Baseline  began educating at eval    Time  3    Period  Weeks    Status  On-going        PT Long Term Goals - 12/22/18 1115      PT LONG TERM GOAL #1   Title  Rt UE strength 5/5    Baseline  see flowsheet    Time  6    Period  Weeks    Status  New      PT LONG TERM GOAL #2   Title  cervical rotation within 5 deg Rt to Lt    Baseline  see flowsheet    Time  6    Period  Weeks    Status  New      PT LONG TERM GOAL #3   Title  Resolution of dropping objects in right hand    Baseline  drops frequently at eval    Time  6    Period  Weeks    Status  New      PT LONG TERM GOAL #4   Title  FOTO to 49% limitation    Time  6    Period  Weeks    Status  New            Plan - 01/01/19 1020    Clinical Impression Statement  Very sore today in all shoulder musculature. Impingment-type pain with flexion that was resolved with releasing trigger points in scalenes. S/s consistent with referral patterns and gross soreness as result of exercises. Difficulty activating periscap musculature & added prone exercises to HEP.    PT Treatment/Interventions  ADLs/Self Care Home Management;Cryotherapy;Electrical Stimulation;Ultrasound;Traction;Moist Heat;Iontophoresis 4mg /ml Dexamethasone;Therapeutic  activities;Therapeutic exercise;Neuromuscular re-education;Manual techniques;Patient/family education;Passive range of motion;Dry needling;Taping    PT Next Visit Plan  cont prone periscap    PT Home Exercise Plan  scap retraction, upright posture, upper trap/first rib with sheet, cervical AROM, green band rows, supine horizontal abduction and ER with red band, prone row & extension    Consulted and Agree with Plan of Care  Patient       Patient will benefit from skilled therapeutic intervention in order to improve the following deficits and impairments:  Decreased range of motion, Difficulty walking, Impaired UE functional use, Increased muscle spasms, Decreased activity tolerance, Pain, Improper body mechanics, Decreased strength, Postural dysfunction, Impaired sensation  Visit Diagnosis: Cervicalgia  Pain in right arm  Abnormal posture  Muscle weakness (generalized)     Problem List Patient Active Problem List   Diagnosis Date Noted  . Chest pain 07/29/2018  . Essential hypertension 07/29/2018  . Asthma 07/29/2018  . Diabetes mellitus type II, non insulin dependent ([place]) 07/29/2018  . Chronic pain 07/29/2018    Lysha Schrade C. Meika Earll PT, DPT 01/01/19 10:25 AM   [facility] [address] [place], [place], [address] Phone: [phone]   Fax:  [phone]  Name: Theresa Carter MRN: [id number] Date of Birth: 04-11-1971

## 2019-01-05 ENCOUNTER — Ambulatory Visit: Payer: 59 | Admitting: Physical Therapy

## 2019-01-05 ENCOUNTER — Other Ambulatory Visit: Payer: Self-pay

## 2019-01-05 ENCOUNTER — Encounter: Payer: Self-pay | Admitting: Physical Therapy

## 2019-01-05 DIAGNOSIS — M542 Cervicalgia: Secondary | ICD-10-CM

## 2019-01-05 DIAGNOSIS — M79601 Pain in right arm: Secondary | ICD-10-CM

## 2019-01-05 DIAGNOSIS — M6281 Muscle weakness (generalized): Secondary | ICD-10-CM

## 2019-01-05 DIAGNOSIS — R293 Abnormal posture: Secondary | ICD-10-CM

## 2019-01-05 NOTE — Therapy (Signed)
[place], [place], [address] Phone: [phone]   Fax:  [phone]  Physical Therapy Treatment  Patient Details  Name: Theresa Carter MRN: [id number] Date of Birth: 1970/09/28 Referring Provider (PT): Delcie Roch, MD   Encounter Date: 01/05/2019  PT End of Session - 01/05/19 1151    Visit Number  7    Number of Visits  13    Date for PT Re-Evaluation  01/30/19    Authorization Type  UHC    PT Start Time  [id number]    PT Stop Time  [id number]    PT Time Calculation (min)  53 min       Past Medical History:  Diagnosis Date  . Asthma   . Diabetes mellitus without complication ([place])   . Ectopic pregnancy   . Hypertension     Past Surgical History:  Procedure Laterality Date  . CHOLECYSTECTOMY    . DILATION AND CURETTAGE OF UTERUS    . ENDOMETRIAL ABLATION    . HIP ARTHROSCOPY     x2  . SALPINGECTOMY    . TRIGGER FINGER RELEASE    . TUBAL LIGATION    . WRIST ARTHROSCOPY      There were no vitals filed for this visit.  Subjective Assessment - 01/05/19 1150    Subjective  Shoulder is hurting this morning. It usually does not hurt in the mornings.    Currently in Pain?  Yes    Pain Score  6     Pain Location  Shoulder    Pain Orientation  Right    Pain Descriptors / Indicators  Aching                       [facility] Adult PT Treatment/Exercise - 01/05/19 0001      Shoulder Exercises: Supine   Horizontal ABduction  20 reps    Theraband Level (Shoulder Horizontal ABduction)  Level 2 (Red)    External Rotation  20 reps    Theraband Level (Shoulder External Rotation)  Level 2 (Red)    Other Supine Exercises  cervical retraction x 10 holding 5 seconds with chin tuck      Shoulder Exercises: Prone   Retraction  10 reps   3 sets   Retraction Limitations  with row     Extension  10 reps   3 sets     Shoulder Exercises: Standing   Other Standing Exercises  clasped hand  deltoid press x 10 supine     Other Standing Exercises  bicep and tricep red band x 20 each standing       Shoulder Exercises: Stretch   Cross Chest Stretch  1 rep;20 seconds    Other Shoulder Stretches  scalene stretch, upper trap stretch     Other Shoulder Stretches  open book      Moist Heat Therapy   Number Minutes Moist Heat  15 Minutes    Moist Heat Location  Cervical      Manual Therapy   Joint Mobilization  Right upper arm deltoid and bicep     Soft tissue mobilization  right shoulder PROM              PT Education - 01/05/19 1232    Education Details  HEP    Person(s) Educated  Patient    Methods  Explanation;Handout    Comprehension  Verbalized understanding       PT Short  Term Goals - 12/22/18 1115      PT SHORT TERM GOAL #1   Title  pt will be aware of her posture during day and understand how to correct    Baseline  began educating at eval    Time  3    Period  Weeks    Status  On-going        PT Long Term Goals - 12/22/18 1115      PT LONG TERM GOAL #1   Title  Rt UE strength 5/5    Baseline  see flowsheet    Time  6    Period  Weeks    Status  New      PT LONG TERM GOAL #2   Title  cervical rotation within 5 deg Rt to Lt    Baseline  see flowsheet    Time  6    Period  Weeks    Status  New      PT LONG TERM GOAL #3   Title  Resolution of dropping objects in right hand    Baseline  drops frequently at eval    Time  6    Period  Weeks    Status  New      PT LONG TERM GOAL #4   Title  FOTO to 49% limitation    Time  6    Period  Weeks    Status  New            Plan - 01/05/19 1157    Clinical Impression Statement  Difficulty with prone extension, keeps elbow slightly flexed. Comlains mostly of paina nd tenderness in upper arm. Time spent with manual soft tissue work. Discussed avoiding to much strain to bicep and provided gentle bicep/tricep activation for HEP. HMP at end of session.    PT Next Visit Plan  cont prone  periscap    PT Home Exercise Plan  scap retraction, upright posture, upper trap/first rib with sheet, cervical AROM, green band rows, supine horizontal abduction and ER with red band, prone row & extension, red band bice and tricep standing       Patient will benefit from skilled therapeutic intervention in order to improve the following deficits and impairments:  Decreased range of motion, Difficulty walking, Impaired UE functional use, Increased muscle spasms, Decreased activity tolerance, Pain, Improper body mechanics, Decreased strength, Postural dysfunction, Impaired sensation  Visit Diagnosis: Cervicalgia  Pain in right arm  Abnormal posture  Muscle weakness (generalized)     Problem List Patient Active Problem List   Diagnosis Date Noted  . Chest pain 07/29/2018  . Essential hypertension 07/29/2018  . Asthma 07/29/2018  . Diabetes mellitus type II, non insulin dependent ([place]) 07/29/2018  . Chronic pain 07/29/2018    Dorene Ar, PTA 01/05/2019, 12:35 PM  [place], [place], [address] Phone: [phone]   Fax:  [phone]  Name: Theresa Carter MRN: [id number] Date of Birth: 08/06/1970

## 2019-01-07 ENCOUNTER — Ambulatory Visit: Payer: 59 | Admitting: Physical Therapy

## 2019-01-07 ENCOUNTER — Other Ambulatory Visit: Payer: Self-pay

## 2019-01-07 ENCOUNTER — Encounter: Payer: Self-pay | Admitting: Physical Therapy

## 2019-01-07 DIAGNOSIS — R293 Abnormal posture: Secondary | ICD-10-CM

## 2019-01-07 DIAGNOSIS — M6281 Muscle weakness (generalized): Secondary | ICD-10-CM

## 2019-01-07 DIAGNOSIS — M79601 Pain in right arm: Secondary | ICD-10-CM

## 2019-01-07 DIAGNOSIS — M542 Cervicalgia: Secondary | ICD-10-CM | POA: Diagnosis not present

## 2019-01-07 NOTE — Therapy (Signed)
[place], [place], [address] Phone: [phone]   Fax:  [phone]  Physical Therapy Treatment  Patient Details  Name: Theresa Carter MRN: [id number] Date of Birth: 09/01/1970 Referring Provider (PT): Delcie Roch, MD   Encounter Date: 01/07/2019  PT End of Session - 01/07/19 1107    Visit Number  8    Number of Visits  13    Date for PT Re-Evaluation  01/30/19    Authorization Type  UHC    PT Start Time  1100    PT Stop Time  1143    PT Time Calculation (min)  43 min    Activity Tolerance  Patient tolerated treatment well    Behavior During Therapy  [facility] for tasks assessed/performed       Past Medical History:  Diagnosis Date  . Asthma   . Diabetes mellitus without complication ([place])   . Ectopic pregnancy   . Hypertension     Past Surgical History:  Procedure Laterality Date  . CHOLECYSTECTOMY    . DILATION AND CURETTAGE OF UTERUS    . ENDOMETRIAL ABLATION    . HIP ARTHROSCOPY     x2  . SALPINGECTOMY    . TRIGGER FINGER RELEASE    . TUBAL LIGATION    . WRIST ARTHROSCOPY      There were no vitals filed for this visit.  Subjective Assessment - 01/07/19 1106    Subjective  She found that spot in my arm that hurt (pointing to deltoid region). It feels better than it was.    Currently in Pain?  Yes    Pain Score  6     Pain Location  Shoulder    Pain Orientation  Right;Lateral    Pain Descriptors / Indicators  --   toothache   Aggravating Factors   constant    Pain Relieving Factors  rest, meds                       [facility] Adult PT Treatment/Exercise - 01/07/19 0001      Manual Therapy   Manual Therapy  Taping    Soft tissue mobilization  IASTM Rt deltoid, upper trap, triceps    Kinesiotex  Inhibit Muscle;Facilitate Muscle      Kinesiotix   Inhibit Muscle   upper trap- Rt    Facilitate Muscle   Deltoid-Rt             PT Education -  01/07/19 1316    Education Details  anatomy of condition    Person(s) Educated  Patient    Methods  Explanation    Comprehension  Verbalized understanding       PT Short Term Goals - 12/22/18 1115      PT SHORT TERM GOAL #1   Title  pt will be aware of her posture during day and understand how to correct    Baseline  began educating at eval    Time  3    Period  Weeks    Status  On-going        PT Long Term Goals - 12/22/18 1115      PT LONG TERM GOAL #1   Title  Rt UE strength 5/5    Baseline  see flowsheet    Time  6    Period  Weeks    Status  New      PT LONG TERM GOAL #2  Title  cervical rotation within 5 deg Rt to Lt    Baseline  see flowsheet    Time  6    Period  Weeks    Status  New      PT LONG TERM GOAL #3   Title  Resolution of dropping objects in right hand    Baseline  drops frequently at eval    Time  6    Period  Weeks    Status  New      PT LONG TERM GOAL #4   Title  FOTO to 49% limitation    Time  6    Period  Weeks    Status  New            Plan - 01/07/19 1311    Clinical Impression Statement  Pt very anxious about why her pain is still present. We discussed overuse/misuse especially when she is feeling better. Reports she felt her best after the first 2 visits so we took a step back to revisit posture. Reviewed hip hinge to reach as she was bending through thoracic spine and reaching with Rt arm creating improper tensions. Trigger points created concordant pain and reduced some with STM in deltoid and triceps.    PT Treatment/Interventions  ADLs/Self Care Home Management;Cryotherapy;Electrical Stimulation;Ultrasound;Traction;Moist Heat;Iontophoresis 4mg /ml Dexamethasone;Therapeutic activities;Therapeutic exercise;Neuromuscular re-education;Manual techniques;Patient/family education;Passive range of motion;Dry needling;Taping    PT Next Visit Plan  how did tape go?  review hip hinge    PT Home Exercise Plan  scap retraction, upright  posture, upper trap/first rib with sheet, cervical AROM, green band rows, supine horizontal abduction and ER with red band, prone row & extension, red band bice and tricep standing    Consulted and Agree with Plan of Care  Patient       Patient will benefit from skilled therapeutic intervention in order to improve the following deficits and impairments:  Decreased range of motion, Difficulty walking, Impaired UE functional use, Increased muscle spasms, Decreased activity tolerance, Pain, Improper body mechanics, Decreased strength, Postural dysfunction, Impaired sensation  Visit Diagnosis: Cervicalgia  Pain in right arm  Abnormal posture  Muscle weakness (generalized)     Problem List Patient Active Problem List   Diagnosis Date Noted  . Chest pain 07/29/2018  . Essential hypertension 07/29/2018  . Asthma 07/29/2018  . Diabetes mellitus type II, non insulin dependent ([place]) 07/29/2018  . Chronic pain 07/29/2018   Trinton Prewitt C. Aldea Avis PT, DPT 01/07/19 1:17 PM   [facility] [address] [place], [place], [address] Phone: [phone]   Fax:  [phone]  Name: Theresa Carter MRN: [id number] Date of Birth: 04-Jul-1970

## 2019-01-12 ENCOUNTER — Ambulatory Visit: Payer: 59 | Admitting: Physical Therapy

## 2019-01-14 ENCOUNTER — Ambulatory Visit: Payer: 59 | Admitting: Physical Therapy

## 2019-01-14 ENCOUNTER — Encounter: Payer: Self-pay | Admitting: Physical Therapy

## 2019-01-14 ENCOUNTER — Other Ambulatory Visit: Payer: Self-pay

## 2019-01-14 DIAGNOSIS — M6281 Muscle weakness (generalized): Secondary | ICD-10-CM

## 2019-01-14 DIAGNOSIS — M542 Cervicalgia: Secondary | ICD-10-CM | POA: Diagnosis not present

## 2019-01-14 DIAGNOSIS — R293 Abnormal posture: Secondary | ICD-10-CM

## 2019-01-14 DIAGNOSIS — M79601 Pain in right arm: Secondary | ICD-10-CM

## 2019-01-14 NOTE — Therapy (Signed)
[place], [place], [address] Phone: [phone]   Fax:  [phone]  Physical Therapy Treatment  Patient Details  Name: Theresa Carter MRN: [id number] Date of Birth: [birth year] Referring Provider (PT): Alcario Drought Merrily Pew, MD   Encounter Date: 01/14/2019  PT End of Session - 01/14/19 1225    Visit Number  9    Number of Visits  13    Date for PT Re-Evaluation  01/30/19    Authorization Type  UHC    PT Start Time  [id number]    PT Stop Time  1225    PT Time Calculation (min)  38 min    Activity Tolerance  Patient tolerated treatment well    Behavior During Therapy  [facility] for tasks assessed/performed       Past Medical History:  Diagnosis Date  . Asthma   . Diabetes mellitus without complication ([place])   . Ectopic pregnancy   . Hypertension     Past Surgical History:  Procedure Laterality Date  . CHOLECYSTECTOMY    . DILATION AND CURETTAGE OF UTERUS    . ENDOMETRIAL ABLATION    . HIP ARTHROSCOPY     x2  . SALPINGECTOMY    . TRIGGER FINGER RELEASE    . TUBAL LIGATION    . WRIST ARTHROSCOPY      There were no vitals filed for this visit.  Subjective Assessment - 01/14/19 1149    Subjective  It actually feels pretty good today. Still hurts when I do too much.    Currently in Pain?  Yes    Pain Score  3     Pain Location  Neck    Pain Orientation  Right    Pain Descriptors / Indicators  Sore                       [facility] Adult PT Treatment/Exercise - 01/14/19 0001      Shoulder Exercises: Standing   Flexion Limitations  functional OH reach- cues for staggered stance    Other Standing Exercises  hip hinge- bar behind back & in front, cues to reach for target    Other Standing Exercises  hip hinge to reach with 1 hand 2lb      Shoulder Exercises: ROM/Strengthening   UBE (Upper Arm Bike)  retro 3 min L1      Shoulder Exercises: Stretch   Other Shoulder Stretches  levator  stretch      Manual Therapy   Joint Mobilization  GHJ AP at 90 abd, distraction at 50 & 90 abd    Soft tissue mobilization  ischemic release Rt upper trap & deltoid, Rt pec minor               PT Short Term Goals - 01/14/19 1234      PT SHORT TERM GOAL #1   Title  pt will be aware of her posture during day and understand how to correct    Status  Achieved        PT Long Term Goals - 12/22/18 1115      PT LONG TERM GOAL #1   Title  Rt UE strength 5/5    Baseline  see flowsheet    Time  6    Period  Weeks    Status  New      PT LONG TERM GOAL #2   Title  cervical rotation within 5 deg Rt to Lt  Baseline  see flowsheet    Time  6    Period  Weeks    Status  New      PT LONG TERM GOAL #3   Title  Resolution of dropping objects in right hand    Baseline  drops frequently at eval    Time  6    Period  Weeks    Status  New      PT LONG TERM GOAL #4   Title  FOTO to 49% limitation    Time  6    Period  Weeks    Status  New            Plan - 01/14/19 1230    Clinical Impression Statement  Good response to ischemic release to trigger points today with decreased pain. Rt levator tightened up after GHJ mobs but relieved with stretching. practiced hip hinge and posture with reaching using cane for cues. Pt is now able to recognize postural deviations and correct with verbal cues.    PT Treatment/Interventions  ADLs/Self Care Home Management;Cryotherapy;Electrical Stimulation;Ultrasound;Traction;Moist Heat;Iontophoresis 4mg /ml Dexamethasone;Therapeutic activities;Therapeutic exercise;Neuromuscular re-education;Manual techniques;Patient/family education;Passive range of motion;Dry needling;Taping    PT Next Visit Plan  OH functional reaching    PT Home Exercise Plan  scap retraction, upright posture, upper trap/first rib with sheet, cervical AROM, green band rows, supine horizontal abduction and ER with red band, prone row & extension, red band bice and tricep  standing    Consulted and Agree with Plan of Care  Patient       Patient will benefit from skilled therapeutic intervention in order to improve the following deficits and impairments:  Decreased range of motion, Difficulty walking, Impaired UE functional use, Increased muscle spasms, Decreased activity tolerance, Pain, Improper body mechanics, Decreased strength, Postural dysfunction, Impaired sensation  Visit Diagnosis: Cervicalgia  Pain in right arm  Abnormal posture  Muscle weakness (generalized)     Problem List Patient Active Problem List   Diagnosis Date Noted  . Chest pain 07/29/2018  . Essential hypertension 07/29/2018  . Asthma 07/29/2018  . Diabetes mellitus type II, non insulin dependent ([place]) 07/29/2018  . Chronic pain 07/29/2018    Michelle Wnek C. Dolan Xia PT, DPT 01/14/19 12:35 PM   [facility] [address] [place], [place], [address] Phone: [phone]   Fax:  [phone]  Name: Theresa Carter MRN: [id number] Date of Birth: 1971-01-13

## 2019-01-15 NOTE — Addendum Note (Signed)
Addended by: Selinda Eon C on: 01/15/2019 12:43 PM   Modules accepted: Orders

## 2019-01-19 ENCOUNTER — Other Ambulatory Visit: Payer: Self-pay

## 2019-01-19 ENCOUNTER — Ambulatory Visit: Payer: 59 | Admitting: Physical Therapy

## 2019-01-19 DIAGNOSIS — M542 Cervicalgia: Secondary | ICD-10-CM

## 2019-01-19 DIAGNOSIS — M79601 Pain in right arm: Secondary | ICD-10-CM

## 2019-01-19 DIAGNOSIS — M6281 Muscle weakness (generalized): Secondary | ICD-10-CM

## 2019-01-19 DIAGNOSIS — R293 Abnormal posture: Secondary | ICD-10-CM

## 2019-01-19 NOTE — Therapy (Signed)
[place], [place], [address] Phone: [phone]   Fax:  [phone]  Physical Therapy Treatment  Patient Details  Name: Theresa Carter MRN: [id number] Date of Birth: 11-08-1970 Referring Provider (PT): Alcario Drought Merrily Pew, MD   Encounter Date: 01/19/2019  PT End of Session - 01/19/19 1242    Visit Number  10    Number of Visits  13    Date for PT Re-Evaluation  01/30/19    Authorization Type  UHC    PT Start Time  [id number]    PT Stop Time  1225    PT Time Calculation (min)  40 min       Past Medical History:  Diagnosis Date  . Asthma   . Diabetes mellitus without complication ([place])   . Ectopic pregnancy   . Hypertension     Past Surgical History:  Procedure Laterality Date  . CHOLECYSTECTOMY    . DILATION AND CURETTAGE OF UTERUS    . ENDOMETRIAL ABLATION    . HIP ARTHROSCOPY     x2  . SALPINGECTOMY    . TRIGGER FINGER RELEASE    . TUBAL LIGATION    . WRIST ARTHROSCOPY      There were no vitals filed for this visit.      [facility] PT Assessment - 01/19/19 0001      Strength   Strength Assessment Site  Hand    Right/Left hand  Right;Left    Right Hand Gross Grasp  --   35,37,40   Left Hand Gross Grasp  --   45, 55, 46                  [facility] Adult PT Treatment/Exercise - 01/19/19 0001      Shoulder Exercises: Prone   Retraction  10 reps   2 sets   Retraction Limitations  with row     Extension  10 reps   2 sets   Horizontal ABduction 1  10 reps   2 sets     Shoulder Exercises: Standing   Flexion Limitations  functional OH reach- 1# into mid cabinet - cues for scapula     Extension  20 reps    Theraband Level (Shoulder Extension)  Level 3 (Green)    Row  20 reps    Theraband Level (Shoulder Row)  Level 3 (Green)      Shoulder Exercises: Stretch   Other Shoulder Stretches  levator stretch      Manual Therapy   Soft tissue mobilization  TPR right upper  trap       Kinesiotix   Inhibit Muscle   upper trap and levator     Facilitate Muscle   retraction               PT Short Term Goals - 01/14/19 1234      PT SHORT TERM GOAL #1   Title  pt will be aware of her posture during day and understand how to correct    Status  Achieved        PT Long Term Goals - 12/22/18 1115      PT LONG TERM GOAL #1   Title  Rt UE strength 5/5    Baseline  see flowsheet    Time  6    Period  Weeks    Status  New      PT LONG TERM GOAL #2   Title  cervical rotation within  5 deg Rt to Lt    Baseline  see flowsheet    Time  6    Period  Weeks    Status  New      PT LONG TERM GOAL #3   Title  Resolution of dropping objects in right hand    Baseline  drops frequently at eval    Time  6    Period  Weeks    Status  New      PT LONG TERM GOAL #4   Title  FOTO to 49% limitation    Time  6    Period  Weeks    Status  New            Plan - 01/19/19 1248    Clinical Impression Statement  Pt reports some forearm pain today with continued upper trap pain. Grip strength decreased on right. Began grip squeezing with towel roll. Continued with OH reaching with 1# weight. She did well with scapula.  TPR performed to right upper trap/levator. Repeated KT tape to inhibit these muscles.    PT Next Visit Plan  OH functional reaching    PT Home Exercise Plan  scap retraction, upright posture, upper trap/first rib with sheet, cervical AROM, green band rows, supine horizontal abduction and ER with red band, prone row & extension, red band bice and tricep standing       Patient will benefit from skilled therapeutic intervention in order to improve the following deficits and impairments:  Decreased range of motion, Difficulty walking, Impaired UE functional use, Increased muscle spasms, Decreased activity tolerance, Pain, Improper body mechanics, Decreased strength, Postural dysfunction, Impaired sensation  Visit Diagnosis: Cervicalgia  Pain  in right arm  Abnormal posture  Muscle weakness (generalized)     Problem List Patient Active Problem List   Diagnosis Date Noted  . Chest pain 07/29/2018  . Essential hypertension 07/29/2018  . Asthma 07/29/2018  . Diabetes mellitus type II, non insulin dependent ([place]) 07/29/2018  . Chronic pain 07/29/2018    Dorene Ar, PTA 01/19/2019, 12:50 PM  [place], [place], [address] Phone: [phone]   Fax:  [phone]  Name: Theresa Carter MRN: [id number] Date of Birth: 1970-11-03

## 2019-01-21 ENCOUNTER — Encounter: Payer: Self-pay | Admitting: Physical Therapy

## 2019-01-21 ENCOUNTER — Ambulatory Visit: Payer: 59 | Admitting: Physical Therapy

## 2019-01-21 ENCOUNTER — Other Ambulatory Visit: Payer: Self-pay

## 2019-01-21 DIAGNOSIS — R293 Abnormal posture: Secondary | ICD-10-CM

## 2019-01-21 DIAGNOSIS — M79601 Pain in right arm: Secondary | ICD-10-CM

## 2019-01-21 DIAGNOSIS — M6281 Muscle weakness (generalized): Secondary | ICD-10-CM

## 2019-01-21 DIAGNOSIS — M542 Cervicalgia: Secondary | ICD-10-CM

## 2019-01-21 NOTE — Therapy (Signed)
[place], [place], [address] Phone: [phone]   Fax:  [phone]  Physical Therapy Treatment  Patient Details  Name: Theresa Carter MRN: [id number] Date of Birth: [birth year] Referring Provider (PT): Alcario Drought Merrily Pew, MD   Encounter Date: 01/21/2019  PT End of Session - 01/21/19 1153    Visit Number  11    Number of Visits  13    Date for PT Re-Evaluation  01/30/19    Authorization Type  UHC    PT Start Time  [id number]    PT Stop Time  1223    PT Time Calculation (min)  38 min       Past Medical History:  Diagnosis Date  . Asthma   . Diabetes mellitus without complication ([place])   . Ectopic pregnancy   . Hypertension     Past Surgical History:  Procedure Laterality Date  . CHOLECYSTECTOMY    . DILATION AND CURETTAGE OF UTERUS    . ENDOMETRIAL ABLATION    . HIP ARTHROSCOPY     x2  . SALPINGECTOMY    . TRIGGER FINGER RELEASE    . TUBAL LIGATION    . WRIST ARTHROSCOPY      There were no vitals filed for this visit.  Subjective Assessment - 01/21/19 1149    Subjective  I pulled my back this moring. Pt reports she  was putting food in crock pot and then bent forward and the sink when pain started in her left back.         [facility] PT Assessment - 01/21/19 0001      Observation/Other Assessments   Focus on Therapeutic Outcomes (FOTO)   43% limited improved from 70% limited       AROM   Cervical - Right Rotation  65    Cervical - Left Rotation  65      Strength   Right Shoulder Flexion  4/5    Right Hand Gross Grasp  --   35,37,40   Left Hand Gross Grasp  --   45, 55, 46                  [facility] Adult PT Treatment/Exercise - 01/21/19 0001      Self-Care   Self-Care  Other Self-Care Comments    Other Self-Care Comments   Discussed use of ice//heat /rest and gentle movements for acute pain       Shoulder Exercises: Supine   Protraction  20 reps    Protraction  Weight (lbs)  2    Horizontal ABduction  20 reps    Theraband Level (Shoulder Horizontal ABduction)  Level 3 (Green)    External Rotation  20 reps    Theraband Level (Shoulder External Rotation)  Level 3 (Green)    Internal Rotation  20 reps    Theraband Level (Shoulder Internal Rotation)  Level 3 (Green)    Flexion Limitations  Bent elbow to punch 2# x 20 , then 1 # full ROM flexion x 20     Other Supine Exercises  yellow putty grip x 1 minute, fatigued    Other Supine Exercises  cervical retraction x 10 holding 5 seconds with chin tuck      Shoulder Exercises: Sidelying   Other Sidelying Exercises  unable to tolerate sidelying       Shoulder Exercises: Pulleys   Flexion  2 minutes      Shoulder Exercises: ROM/Strengthening   UBE (Upper  Arm Bike)  retro 1 minute increased back pain                PT Short Term Goals - 01/14/19 1234      PT SHORT TERM GOAL #1   Title  pt will be aware of her posture during day and understand how to correct    Status  Achieved        PT Long Term Goals - 01/21/19 1257      PT LONG TERM GOAL #1   Title  Rt UE strength 5/5    Baseline  see flowsheet    Time  6    Period  Weeks    Status  On-going      PT LONG TERM GOAL #2   Title  cervical rotation within 5 deg Rt to Lt    Baseline  see flowsheet    Time  6    Period  Weeks    Status  Achieved      PT LONG TERM GOAL #3   Title  Resolution of dropping objects in right hand    Baseline  drops frequently at eval, see grip strength    Time  6    Period  Weeks    Status  On-going      PT LONG TERM GOAL #4   Title  FOTO to 49% limitation    Baseline  43% status on 01/21/19    Time  6    Period  Weeks    Status  Achieved            Plan - 01/21/19 1251    Clinical Impression Statement  Pt arrives in signifant left sided lumbar pain with reports of "pulling something" during meal prep this mornning. She is limited in therex today due to this pain. Most of the session  spent in supine for shoulder strength as tolerated. Attempted S/L therex but she was unable to tolerate. Her FOTO for Neck limitation improved from 70% limited to 43% limited. She has improved her Neck AROM. She has improved her strength however would still benefot from additional strengthening. She has achieved 2 out of 4 LTGs.    PT Next Visit Plan  OH functional reaching, how iis her back since she pulled a muscle?    PT Home Exercise Plan  scap retraction, upright posture, upper trap/first rib with sheet, cervical AROM, green band rows, supine horizontal abduction and ER with red band, prone row & extension, red band bice and tricep standing       Patient will benefit from skilled therapeutic intervention in order to improve the following deficits and impairments:  Decreased range of motion, Difficulty walking, Impaired UE functional use, Increased muscle spasms, Decreased activity tolerance, Pain, Improper body mechanics, Decreased strength, Postural dysfunction, Impaired sensation  Visit Diagnosis: Cervicalgia  Pain in right arm  Abnormal posture  Muscle weakness (generalized)     Problem List Patient Active Problem List   Diagnosis Date Noted  . Chest pain 07/29/2018  . Essential hypertension 07/29/2018  . Asthma 07/29/2018  . Diabetes mellitus type II, non insulin dependent ([place]) 07/29/2018  . Chronic pain 07/29/2018    Dorene Ar, PTA 01/21/2019, 1:00 PM  [facility] [address] [place], [place], [address] Phone: [phone]   Fax:  [phone]  Name: Theresa Carter MRN: [id number] Date of Birth: [birth year]

## 2019-02-03 ENCOUNTER — Ambulatory Visit: Payer: 59 | Admitting: Physical Therapy

## 2019-02-05 ENCOUNTER — Encounter: Payer: Self-pay | Admitting: Physical Therapy

## 2019-02-05 ENCOUNTER — Ambulatory Visit: Payer: 59 | Attending: Internal Medicine | Admitting: Physical Therapy

## 2019-02-05 ENCOUNTER — Other Ambulatory Visit: Payer: Self-pay

## 2019-02-05 DIAGNOSIS — R293 Abnormal posture: Secondary | ICD-10-CM

## 2019-02-05 DIAGNOSIS — M79601 Pain in right arm: Secondary | ICD-10-CM

## 2019-02-05 DIAGNOSIS — M542 Cervicalgia: Secondary | ICD-10-CM | POA: Diagnosis present

## 2019-02-05 DIAGNOSIS — M6281 Muscle weakness (generalized): Secondary | ICD-10-CM
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
## 2019-02-11 ENCOUNTER — Ambulatory Visit: Payer: 59 | Admitting: Physical Therapy

## 2019-02-11 ENCOUNTER — Encounter: Payer: Self-pay | Admitting: Physical Therapy

## 2019-02-11 ENCOUNTER — Other Ambulatory Visit: Payer: Self-pay

## 2019-02-11 DIAGNOSIS — M79601 Pain in right arm: Secondary | ICD-10-CM

## 2019-02-11 DIAGNOSIS — R293 Abnormal posture: Secondary | ICD-10-CM

## 2019-02-11 DIAGNOSIS — M542 Cervicalgia: Secondary | ICD-10-CM | POA: Diagnosis not present

## 2019-02-11 DIAGNOSIS — M6281 Muscle weakness (generalized): Secondary | ICD-10-CM
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
## 2019-02-20 ENCOUNTER — Ambulatory Visit: Payer: 59 | Admitting: Physical Therapy

## 2019-02-20 ENCOUNTER — Encounter: Payer: Self-pay | Admitting: Physical Therapy

## 2019-02-20 ENCOUNTER — Other Ambulatory Visit: Payer: Self-pay

## 2019-02-20 DIAGNOSIS — R293 Abnormal posture: Secondary | ICD-10-CM

## 2019-02-20 DIAGNOSIS — M542 Cervicalgia: Secondary | ICD-10-CM | POA: Diagnosis not present

## 2019-02-20 DIAGNOSIS — M6281 Muscle weakness (generalized): Secondary | ICD-10-CM

## 2019-02-20 DIAGNOSIS — M79601 Pain in right arm: Secondary | ICD-10-CM

## 2019-02-20 NOTE — Therapy (Signed)
[place], [place], [address] Phone: [phone]   Fax:  [phone]  Physical Therapy Treatment  Patient Details  Name: Theresa Carter MRN: [id number] Date of Birth: May 04, 1970 Referring Provider (PT): Alphonzo Cruise   Encounter Date: 02/20/2019  PT End of Session - 02/20/19 1320    Visit Number  14    Number of Visits  24    Date for PT Re-Evaluation  03/20/19    Authorization Type  UHC    PT Start Time  1232    PT Stop Time  1313    PT Time Calculation (min)  41 min    Activity Tolerance  Patient tolerated treatment well    Behavior During Therapy  [facility] for tasks assessed/performed       Past Medical History:  Diagnosis Date  . Asthma   . Diabetes mellitus without complication ([place])   . Ectopic pregnancy   . Hypertension     Past Surgical History:  Procedure Laterality Date  . CHOLECYSTECTOMY    . DILATION AND CURETTAGE OF UTERUS    . ENDOMETRIAL ABLATION    . HIP ARTHROSCOPY     x2  . SALPINGECTOMY    . TRIGGER FINGER RELEASE    . TUBAL LIGATION    . WRIST ARTHROSCOPY      There were no vitals filed for this visit.  Subjective Assessment - 02/20/19 1233    Subjective  Rt upper trap is sore and achey.    Currently in Pain?  Yes    Pain Score  2     Pain Location  Neck    Pain Orientation  Right    Pain Descriptors / Indicators  Sore    Aggravating Factors   unknown, something that tenses me    Pain Relieving Factors  massage                       [facility] Adult PT Treatment/Exercise - 02/20/19 0001      Shoulder Exercises: Standing   Flexion Limitations  I & Y at wall    ABduction Limitations  Ws at wall    Row Limitations  wide row from [facility] green tband    Other Standing Exercises  bent over row 5lb    Other Standing Exercises  hip hinge with dowel      Shoulder Exercises: ROM/Strengthening   UBE (Upper Arm Bike)  retro 3 min L1 following manual      Shoulder Exercises: Stretch   Table Stretch -Flexion Limitations  L stretch    Other Shoulder Stretches  upper trap & levator stretch      Manual Therapy   Manual therapy comments  manual cervical traction & passive ROM    Joint Mobilization  C6 Rt to Lt in Rt sidebend    Soft tissue mobilization  bil upper trap & levator, suboccipitals               PT Short Term Goals - 01/14/19 1234      PT SHORT TERM GOAL #1   Title  pt will be aware of her posture during day and understand how to correct    Status  Achieved        PT Long Term Goals - 02/05/19 1154      PT LONG TERM GOAL #1   Title  Rt UE strength 5/5    Baseline  see flowsheet  Status  On-going      PT LONG TERM GOAL #2   Title  cervical rotation within 5 deg Rt to Lt    Status  Achieved      PT LONG TERM GOAL #3   Title  Resolution of dropping objects in right hand    Baseline  occasionally dropping rather than everything falling out of my hand as before    Status  On-going      PT LONG TERM GOAL #4   Title  FOTO to 49% limitation    Baseline  43% status on 01/21/19    Status  Achieved            Plan - 02/20/19 1323    Clinical Impression Statement  Trigger point in Rt upper trap limiting mobility at C6 was reduced with manual therapy. Continues to require cues to decrease GHJ elevation. improvement in control of kyphosis in hip hinge.    PT Treatment/Interventions  ADLs/Self Care Home Management;Cryotherapy;Electrical Stimulation;Ultrasound;Traction;Moist Heat;Iontophoresis 4mg /ml Dexamethasone;Therapeutic activities;Therapeutic exercise;Neuromuscular re-education;Manual techniques;Patient/family education;Passive range of motion;Dry needling;Taping    PT Next Visit Plan  prone scap exercises    PT Home Exercise Plan  scap retraction, upright posture, upper trap/first rib with sheet, cervical AROM, green band rows, supine horizontal abduction and ER with red band, prone row & extension, red band  bice and tricep standing; circles in abduction, horiz abd, ball on wall circles    Consulted and Agree with Plan of Care  Patient       Patient will benefit from skilled therapeutic intervention in order to improve the following deficits and impairments:  Decreased range of motion, Difficulty walking, Impaired UE functional use, Increased muscle spasms, Decreased activity tolerance, Pain, Improper body mechanics, Decreased strength, Postural dysfunction, Impaired sensation  Visit Diagnosis: Cervicalgia  Pain in right arm  Abnormal posture  Muscle weakness (generalized)     Problem List Patient Active Problem List   Diagnosis Date Noted  . Chest pain 07/29/2018  . Essential hypertension 07/29/2018  . Asthma 07/29/2018  . Diabetes mellitus type II, non insulin dependent ([place]) 07/29/2018  . Chronic pain 07/29/2018   [place] C. Nicosha Struve PT, DPT 02/20/19 1:26 PM   [place] [facility] [address] [place], [place], [address] Phone: [phone]   Fax:  [phone]  Name: Ria Kate MRN: [id number] Date of Birth: [birth year]

## 2019-02-27 ENCOUNTER — Encounter: Payer: Self-pay | Admitting: Physical Therapy

## 2019-02-27 ENCOUNTER — Ambulatory Visit: Payer: 59 | Attending: Internal Medicine | Admitting: Physical Therapy

## 2019-02-27 ENCOUNTER — Other Ambulatory Visit: Payer: Self-pay

## 2019-02-27 DIAGNOSIS — M542 Cervicalgia: Secondary | ICD-10-CM | POA: Diagnosis not present

## 2019-02-27 DIAGNOSIS — M79601 Pain in right arm: Secondary | ICD-10-CM | POA: Insufficient documentation

## 2019-02-27 DIAGNOSIS — R293 Abnormal posture: Secondary | ICD-10-CM | POA: Insufficient documentation

## 2019-02-27 DIAGNOSIS — M6281 Muscle weakness (generalized): Secondary | ICD-10-CM | POA: Insufficient documentation

## 2019-02-27 NOTE — Therapy (Signed)
[place], [place], [address] Phone: [phone]   Fax:  [phone]  Physical Therapy Treatment  Patient Details  Name: Theresa Carter MRN: [id number] Date of Birth: 07-Aug-1970 Referring Provider (PT): Alphonzo Cruise   Encounter Date: 02/27/2019  PT End of Session - 02/27/19 1153    Visit Number  15    Number of Visits  24    Date for PT Re-Evaluation  03/20/19    Authorization Type  UHC    PT Start Time  [id number]   pt arrived late   PT Stop Time  1235    PT Time Calculation (min)  42 min    Activity Tolerance  Patient tolerated treatment well    Behavior During Therapy  [facility] for tasks assessed/performed       Past Medical History:  Diagnosis Date  . Asthma   . Diabetes mellitus without complication ([place])   . Ectopic pregnancy   . Hypertension     Past Surgical History:  Procedure Laterality Date  . CHOLECYSTECTOMY    . DILATION AND CURETTAGE OF UTERUS    . ENDOMETRIAL ABLATION    . HIP ARTHROSCOPY     x2  . SALPINGECTOMY    . TRIGGER FINGER RELEASE    . TUBAL LIGATION    . WRIST ARTHROSCOPY      There were no vitals filed for this visit.  Subjective Assessment - 02/27/19 1154    Subjective  Same spot in upper trap but not as intense    Currently in Pain?  Yes    Pain Score  2     Pain Location  Neck    Pain Orientation  Right    Pain Descriptors / Indicators  Sore                       [facility] Adult PT Treatment/Exercise - 02/27/19 0001      Modalities   Modalities  Traction      Traction   Type of Traction  Cervical    Min (lbs)  5    Max (lbs)  13    Hold Time  60    Rest Time  10    Time  15 min      Manual Therapy   Joint Mobilization  lateral cervical glides    Soft tissue mobilization  Rt upper trap, Rt cervical paraspinals, suboccipitals      Kinesiotix   Inhibit Muscle   upper traps    Facilitate Muscle   scap retraction                PT Short Term Goals - 01/14/19 1234      PT SHORT TERM GOAL #1   Title  pt will be aware of her posture during day and understand how to correct    Status  Achieved        PT Long Term Goals - 02/05/19 1154      PT LONG TERM GOAL #1   Title  Rt UE strength 5/5    Baseline  see flowsheet    Status  On-going      PT LONG TERM GOAL #2   Title  cervical rotation within 5 deg Rt to Lt    Status  Achieved      PT LONG TERM GOAL #3   Title  Resolution of dropping objects in right hand    Baseline  occasionally dropping rather than everything falling out of my hand as before    Status  On-going      PT LONG TERM GOAL #4   Title  FOTO to 49% limitation    Baseline  43% status on 01/21/19    Status  Achieved            Plan - 02/27/19 1428    Clinical Impression Statement  C4 trigger point and trigger point in Rt upper trap decreased with manual therpay and then utilized mechanical traction for LLLD stretch. Placed Ktape to decrease shoulder elevation and encourage scapular retraction.    PT Treatment/Interventions  ADLs/Self Care Home Management;Cryotherapy;Electrical Stimulation;Ultrasound;Traction;Moist Heat;Iontophoresis 4mg /ml Dexamethasone;Therapeutic activities;Therapeutic exercise;Neuromuscular re-education;Manual techniques;Patient/family education;Passive range of motion;Dry needling;Taping    PT Next Visit Plan  outcome of traction? lat & lower trap strengthening    PT Home Exercise Plan  scap retraction, upright posture, upper trap/first rib with sheet, cervical AROM, green band rows, supine horizontal abduction and ER with red band, prone row & extension, red band bice and tricep standing; circles in abduction, horiz abd, ball on wall circles    Consulted and Agree with Plan of Care  Patient       Patient will benefit from skilled therapeutic intervention in order to improve the following deficits and impairments:  Decreased range of motion,  Difficulty walking, Impaired UE functional use, Increased muscle spasms, Decreased activity tolerance, Pain, Improper body mechanics, Decreased strength, Postural dysfunction, Impaired sensation  Visit Diagnosis: Cervicalgia  Pain in right arm  Abnormal posture  Muscle weakness (generalized)     Problem List Patient Active Problem List   Diagnosis Date Noted  . Chest pain 07/29/2018  . Essential hypertension 07/29/2018  . Asthma 07/29/2018  . Diabetes mellitus type II, non insulin dependent ([place]) 07/29/2018  . Chronic pain 07/29/2018    Jianna Drabik C. Morganne Haile PT, DPT 02/27/19 2:33 PM   [facility] [address] [place], [place], [address] Phone: [phone]   Fax:  [phone]  Name: Shauntina Liebmann MRN: [id number] Date of Birth: 18-Mar-1971

## 2019-03-04 ENCOUNTER — Ambulatory Visit: Payer: 59 | Admitting: Physical Therapy

## 2019-03-04 ENCOUNTER — Other Ambulatory Visit: Payer: Self-pay

## 2019-03-04 ENCOUNTER — Encounter: Payer: Self-pay | Admitting: Physical Therapy

## 2019-03-04 DIAGNOSIS — M6281 Muscle weakness (generalized): Secondary | ICD-10-CM

## 2019-03-04 DIAGNOSIS — M542 Cervicalgia: Secondary | ICD-10-CM

## 2019-03-04 DIAGNOSIS — R293 Abnormal posture: Secondary | ICD-10-CM

## 2019-03-04 DIAGNOSIS — M79601 Pain in right arm: Secondary | ICD-10-CM

## 2019-03-04 NOTE — Therapy (Signed)
[place], [place], [address] Phone: [phone]   Fax:  [phone]  Physical Therapy Treatment  Patient Details  Name: Theresa Carter MRN: [id number] Date of Birth: 13-Feb-1971 Referring Provider (PT): Alphonzo Cruise   Encounter Date: 03/04/2019  PT End of Session - 03/04/19 1234    Visit Number  16    Number of Visits  24    Date for PT Re-Evaluation  03/20/19    Authorization Type  UHC    PT Start Time  1233    PT Stop Time  1325    PT Time Calculation (min)  52 min    Activity Tolerance  Patient tolerated treatment well    Behavior During Therapy  [facility] for tasks assessed/performed       Past Medical History:  Diagnosis Date  . Asthma   . Diabetes mellitus without complication ([place])   . Ectopic pregnancy   . Hypertension     Past Surgical History:  Procedure Laterality Date  . CHOLECYSTECTOMY    . DILATION AND CURETTAGE OF UTERUS    . ENDOMETRIAL ABLATION    . HIP ARTHROSCOPY     x2  . SALPINGECTOMY    . TRIGGER FINGER RELEASE    . TUBAL LIGATION    . WRIST ARTHROSCOPY      There were no vitals filed for this visit.  Subjective Assessment - 03/04/19 1234    Subjective  I am doing okay which is suprising because of the weather.    Currently in Pain?  Yes    Pain Score  2     Pain Location  Shoulder    Pain Orientation  Right    Pain Descriptors / Indicators  Sore                       [facility] Adult PT Treatment/Exercise - 03/04/19 0001      Shoulder Exercises: Seated   External Rotation Limitations  elbows on bolster, yellow tband      Shoulder Exercises: Prone   Retraction Limitations  retraction+row 3lb    Flexion Limitations  prone scaption, unweighted x20    Other Prone Exercises  triceps kicks 2lb 3x10      Shoulder Exercises: Sidelying   External Rotation  20 reps;10 reps    External Rotation Weight (lbs)  3    ABduction  20 reps    ABduction  Weight (lbs)  2    Other Sidelying Exercises  horiz abd 2lb      Shoulder Exercises: ROM/Strengthening   Lat Pull Limitations  x20 25lb      Traction   Min (lbs)  5    Max (lbs)  13    Hold Time  60    Rest Time  10    Time  15 min      Manual Therapy   Soft tissue mobilization  Rt upper trap, suboccipitals, Rt pecs               PT Short Term Goals - 01/14/19 1234      PT SHORT TERM GOAL #1   Title  pt will be aware of her posture during day and understand how to correct    Status  Achieved        PT Long Term Goals - 02/05/19 1154      PT LONG TERM GOAL #1   Title  Rt UE strength 5/5  Baseline  see flowsheet    Status  On-going      PT LONG TERM GOAL #2   Title  cervical rotation within 5 deg Rt to Lt    Status  Achieved      PT LONG TERM GOAL #3   Title  Resolution of dropping objects in right hand    Baseline  occasionally dropping rather than everything falling out of my hand as before    Status  On-going      PT LONG TERM GOAL #4   Title  FOTO to 49% limitation    Baseline  43% status on 01/21/19    Status  Achieved            Plan - 03/04/19 1451    Clinical Impression Statement  Returned to strengthening challenges today which pt tolerated well. cues for scapular retraction and abdominal engagement. Lat activation irritated lower back slightly. Traction applied again today following exercise for LLLD pull.    PT Treatment/Interventions  ADLs/Self Care Home Management;Cryotherapy;Electrical Stimulation;Ultrasound;Traction;Moist Heat;Iontophoresis 4mg /ml Dexamethasone;Therapeutic activities;Therapeutic exercise;Neuromuscular re-education;Manual techniques;Patient/family education;Passive range of motion;Dry needling;Taping    PT Next Visit Plan  continue strengthening challenges    PT Home Exercise Plan  scap retraction, upright posture, upper trap/first rib with sheet, cervical AROM, green band rows, supine horizontal abduction and ER with  red band, prone row & extension, red band bice and tricep standing; circles in abduction, horiz abd, ball on wall circles    Consulted and Agree with Plan of Care  Patient       Patient will benefit from skilled therapeutic intervention in order to improve the following deficits and impairments:  Decreased range of motion, Difficulty walking, Impaired UE functional use, Increased muscle spasms, Decreased activity tolerance, Pain, Improper body mechanics, Decreased strength, Postural dysfunction, Impaired sensation  Visit Diagnosis: Cervicalgia  Pain in right arm  Abnormal posture  Muscle weakness (generalized)     Problem List Patient Active Problem List   Diagnosis Date Noted  . Chest pain 07/29/2018  . Essential hypertension 07/29/2018  . Asthma 07/29/2018  . Diabetes mellitus type II, non insulin dependent ([place]) 07/29/2018  . Chronic pain 07/29/2018    Timo Hartwig C. Beverly Ferner PT, DPT 03/04/19 2:55 PM   [place] [facility] [address] [place], [place], [address] Phone: [phone]   Fax:  [phone]  Name: Theresa Carter MRN: [id number] Date of Birth: 09/08/1970

## 2019-03-11 ENCOUNTER — Ambulatory Visit: Payer: 59 | Admitting: Physical Therapy

## 2019-03-18 ENCOUNTER — Ambulatory Visit: Payer: 59 | Admitting: Physical Therapy

## 2019-03-30 ENCOUNTER — Other Ambulatory Visit: Payer: Self-pay

## 2019-03-30 ENCOUNTER — Ambulatory Visit: Payer: 59 | Attending: Internal Medicine | Admitting: Physical Therapy

## 2019-03-30 ENCOUNTER — Encounter: Payer: Self-pay | Admitting: Physical Therapy

## 2019-03-30 DIAGNOSIS — M79601 Pain in right arm: Secondary | ICD-10-CM | POA: Insufficient documentation

## 2019-03-30 DIAGNOSIS — R293 Abnormal posture: Secondary | ICD-10-CM | POA: Diagnosis present

## 2019-03-30 DIAGNOSIS — M6281 Muscle weakness (generalized): Secondary | ICD-10-CM | POA: Insufficient documentation

## 2019-03-30 DIAGNOSIS — M542 Cervicalgia: Secondary | ICD-10-CM | POA: Insufficient documentation

## 2019-03-30 NOTE — Therapy (Signed)
[place], [place], [address] Phone: [phone]   Fax:  [phone]  Physical Therapy Treatment/Discharge  Patient Details  Name: Theresa Carter MRN: [id number] Date of Birth: 04/08/1971 Referring Provider (PT): Alphonzo Cruise   Encounter Date: 03/30/2019  PT End of Session - 03/30/19 1018    Visit Number  17    Number of Visits  24    Date for PT Re-Evaluation  03/30/19    Authorization Type  --    PT Start Time  1017    PT Stop Time  1038    PT Time Calculation (min)  21 min    Activity Tolerance  Patient tolerated treatment well    Behavior During Therapy  [facility] for tasks assessed/performed       Past Medical History:  Diagnosis Date  . Asthma   . Diabetes mellitus without complication ([place])   . Ectopic pregnancy   . Hypertension     Past Surgical History:  Procedure Laterality Date  . CHOLECYSTECTOMY    . DILATION AND CURETTAGE OF UTERUS    . ENDOMETRIAL ABLATION    . HIP ARTHROSCOPY     x2  . SALPINGECTOMY    . TRIGGER FINGER RELEASE    . TUBAL LIGATION    . WRIST ARTHROSCOPY      There were no vitals filed for this visit.  Subjective Assessment - 03/30/19 1018    Subjective  Its just weak really. Decorating for Christmas and reaching.    Patient Stated Goals  strength in arm         [facility] PT Assessment - 03/30/19 0001      Assessment   Medical Diagnosis  Rt shoulder OA, biceps tendinitis    Referring Provider (PT)  Alphonzo Cruise      Observation/Other Assessments   Focus on Therapeutic Outcomes (FOTO)   43% limited improved from 70% limited       Strength   Right Shoulder Flexion  5/5    Right Shoulder Extension  5/5    Right Shoulder ABduction  5/5    Right Shoulder Internal Rotation  5/5    Right Shoulder External Rotation  5/5    Right Shoulder Horizontal ABduction  5/5    Right Hand Grip (lbs)  60 55 55    Left Hand Grip (lbs)  70 65 60                            PT Education - 03/30/19 1044    Education Details  importance of continued HEP, goals.    Person(s) Educated  Patient    Methods  Explanation    Comprehension  Verbalized understanding       PT Short Term Goals - 01/14/19 1234      PT SHORT TERM GOAL #1   Title  pt will be aware of her posture during day and understand how to correct    Status  Achieved        PT Long Term Goals - 03/30/19 1021      PT LONG TERM GOAL #1   Title  Rt UE strength 5/5    Status  Achieved      PT LONG TERM GOAL #2   Title  cervical rotation within 5 deg Rt to Lt    Baseline  see flowsheet    Status  Achieved  PT LONG TERM GOAL #3   Title  Resolution of dropping objects in right hand    Status  Achieved      PT LONG TERM GOAL #4   Title  FOTO to 49% limitation    Baseline  43% status on 01/21/19    Status  Achieved            Plan - 03/30/19 1045    Clinical Impression Statement  Pt has met all of her goals at this time and demo significant improvement in GHJ and grip strength. discussed the importance of being aware of posture and long term care. Encouraged her to contact us with any further question.    PT Treatment/Interventions  ADLs/Self Care Home Management;Cryotherapy;Electrical Stimulation;Ultrasound;Traction;Moist Heat;Iontophoresis '4mg'$ /ml Dexamethasone;Therapeutic activities;Therapeutic exercise;Neuromuscular re-education;Manual techniques;Patient/family education;Passive range of motion;Dry needling;Taping    PT Home Exercise Plan  scap retraction, upright posture, upper trap/first rib with sheet, cervical AROM, green band rows, supine horizontal abduction and ER with red band, prone row & extension, red band bice and tricep standing; circles in abduction, horiz abd, ball on wall circles    Consulted and Agree with Plan of Care  Patient       Patient will benefit from skilled therapeutic intervention in order to improve the  following deficits and impairments:  Decreased range of motion, Difficulty walking, Impaired UE functional use, Increased muscle spasms, Decreased activity tolerance, Pain, Improper body mechanics, Decreased strength, Postural dysfunction, Impaired sensation  Visit Diagnosis: Cervicalgia - Plan: PT plan of care cert/re-cert  Pain in right arm - Plan: PT plan of care cert/re-cert  Abnormal posture - Plan: PT plan of care cert/re-cert  Muscle weakness (generalized) - Plan: PT plan of care cert/re-cert     Problem List Patient Active Problem List   Diagnosis Date Noted  . Chest pain 07/29/2018  . Essential hypertension 07/29/2018  . Asthma 07/29/2018  . Diabetes mellitus type II, non insulin dependent ([place]) 07/29/2018  . Chronic pain 07/29/2018    PHYSICAL THERAPY DISCHARGE SUMMARY  Visits from Start of Care: 17  Current functional level related to goals / functional outcomes: See above   Remaining deficits: See above   Education / Equipment: Anatomy of condition, POC, HEP, exercise form/rationale  Plan: Patient agrees to discharge.  Patient goals were met. Patient is being discharged due to meeting the stated rehab goals.  ?????     Jimmey Hengel C. Casmer Yepiz PT, DPT 03/30/19 10:50 AM   [facility] [address] [place], [place], [address] Phone: [phone]   Fax:  [phone]  Name: Theresa Carter MRN: [id number] Date of Birth: [birth year]

## 2019-05-05 ENCOUNTER — Other Ambulatory Visit: Payer: Self-pay

## 2019-05-05 ENCOUNTER — Encounter (HOSPITAL_COMMUNITY): Payer: Self-pay

## 2019-05-05 ENCOUNTER — Ambulatory Visit (HOSPITAL_COMMUNITY)
Admission: EM | Admit: 2019-05-05 | Discharge: 2019-05-05 | Disposition: A | Discharge status: Home / Self Care | Payer: 59 | Attending: Family Medicine | Admitting: Family Medicine

## 2019-05-05 DIAGNOSIS — Z20822 Contact with and (suspected) exposure to covid-19: Secondary | ICD-10-CM | POA: Diagnosis not present

## 2019-05-05 DIAGNOSIS — J45909 Unspecified asthma, uncomplicated: Secondary | ICD-10-CM | POA: Diagnosis not present

## 2019-05-05 DIAGNOSIS — I10 Essential (primary) hypertension: Secondary | ICD-10-CM | POA: Insufficient documentation

## 2019-05-05 DIAGNOSIS — E119 Type 2 diabetes mellitus without complications: Secondary | ICD-10-CM | POA: Diagnosis not present

## 2019-05-05 DIAGNOSIS — Z87891 Personal history of nicotine dependence: Secondary | ICD-10-CM | POA: Diagnosis not present

## 2019-05-05 DIAGNOSIS — R519 Headache, unspecified: Secondary | ICD-10-CM | POA: Insufficient documentation

## 2019-05-05 DIAGNOSIS — Z7982 Long term (current) use of aspirin: Secondary | ICD-10-CM | POA: Diagnosis not present

## 2019-05-05 DIAGNOSIS — Z79899 Other long term (current) drug therapy: Secondary | ICD-10-CM | POA: Diagnosis not present

## 2019-05-05 NOTE — Discharge Instructions (Addendum)
Tylenol or ibuprofen for your headache.  Go directly to the Emergency Department or call 911 if you have severe chest pain, shortness of breath, severe diarrhea or feel as though you might pass out.   If your Covid-19 test is positive, you will receive a phone call from [facility] regarding your results. Negative test results are not called. Both positive and negative results area always visible on MyChart. If you do not have a MyChart account, sign up instructions are in your discharge papers.   Persons who are directed to care for themselves at home may discontinue isolation under the following conditions:   At least 10 days have passed since symptom onset and  At least 24 hours have passed without running a fever (this means without the use of fever-reducing medications) and  Other symptoms have improved.  Persons infected with COVID-19 who never develop symptoms may discontinue isolation and other precautions 10 days after the date of their first positive COVID-19 test.

## 2019-05-05 NOTE — ED Triage Notes (Signed)
Pt presents to UC for COVID test after exposure 3 days ago. Pt denies any signs and symptoms.

## 2019-05-05 NOTE — ED Provider Notes (Signed)
[place]    CSN: [id number] Arrival date & time: 05/05/19  1816      History   Chief Complaint No chief complaint on file.   HPI Theresa Carter is a 49 y.o. female.   Patient reports to urgent care today for Covid testing due to recent exposure to a covid positive person. She was care taking for her 55 month old family member starting Thursday 04/30/2019 and this child was sick at the time but has since tested positive for Covid. She reports mild nausea and diarrhea on Saturday but that has since gone away. She also reports a mild frontal headache but believes this to be related to stress from the child being very sick. She denies cough, congestion, shortness of breath, chest pain, fever, chills.   She has taken ibuprofen for the headache.      Past Medical History:  Diagnosis Date  . Asthma   . Diabetes mellitus without complication ([place])   . Ectopic pregnancy   . Hypertension     Patient Active Problem List   Diagnosis Date Noted  . Chest pain 07/29/2018  . Essential hypertension 07/29/2018  . Asthma 07/29/2018  . Diabetes mellitus type II, non insulin dependent ([place]) 07/29/2018  . Chronic pain 07/29/2018    Past Surgical History:  Procedure Laterality Date  . CHOLECYSTECTOMY    . DILATION AND CURETTAGE OF UTERUS    . ENDOMETRIAL ABLATION    . HIP ARTHROSCOPY     x2  . SALPINGECTOMY    . TRIGGER FINGER RELEASE    . TUBAL LIGATION    . WRIST ARTHROSCOPY      OB History   No obstetric history on file.      Home Medications    Prior to Admission medications   Medication Sig Start Date End Date Taking? Authorizing Provider  albuterol (PROVENTIL) (2.5 MG/3ML) 0.083% nebulizer solution Take 3 mLs (2.5 mg total) by nebulization every 6 (six) hours as needed for wheezing or shortness of breath. 07/30/18   Regalado, Jerald Kief A, MD  aspirin EC 81 MG EC tablet Take 1 tablet (81 mg total) by mouth daily. Patient not taking: Reported on 12/16/2018  07/31/18   Regalado, Jerald Kief A, MD  Canagliflozin (INVOKANA PO) Take 1 tablet by mouth daily.    [provider]  cyclobenzaprine (FLEXERIL) 10 MG tablet Take 1 tablet by mouth at bedtime.    [provider]  guaiFENesin-dextromethorphan (ROBITUSSIN DM) 100-10 MG/5ML syrup Take 5 mLs by mouth every 4 (four) hours as needed for cough. Patient not taking: Reported on 12/16/2018 07/30/18   Regalado, Jerald Kief A, MD  HYDROcodone-acetaminophen (NORCO) 7.5-325 MG tablet Take 1 tablet by mouth every 6 (six) hours as needed for pain.    [provider]  ibuprofen (ADVIL) 800 MG tablet Take 1 tablet (800 mg total) by mouth 3 (three) times daily. 12/03/18   Wieters, Hallie C, PA-C  levocetirizine (XYZAL) 5 MG tablet Take 5 mg by mouth every evening.    [provider]  liraglutide (VICTOZA) 18 MG/3ML SOPN Inject 0.6 mg into the skin daily.    [provider]  metFORMIN (GLUCOPHAGE) 1000 MG tablet Take 1 tablet by mouth 2 (two) times daily.    [provider]  omeprazole (PRILOSEC) 40 MG capsule Take 40 mg by mouth daily.    [provider]  ramipril (ALTACE) 10 MG capsule Take 1 capsule by mouth daily.    [provider]  verapamil Delfino Lovett  PM) 180 MG 24 hr capsule Take 1 capsule by mouth daily.    [provider]    Family History Family History  Problem Relation Age of Onset  . Venous thrombosis Mother   . Heart attack Brother   . Venous thrombosis Son     Social History Social History   Tobacco Use  . Smoking status: Former Research scientist (life sciences)  . Smokeless tobacco: Never Used  Substance Use Topics  . Alcohol use: Yes    Comment: RARE  . Drug use: Never     Allergies   Patient has no known allergies.   Review of Systems Review of Systems  Constitutional: Negative for chills and fever.  HENT: Negative for congestion, ear pain, sinus pressure, sinus pain and sore throat.   Eyes: Negative for pain and visual disturbance.   Respiratory: Negative for cough and shortness of breath.   Cardiovascular: Negative for chest pain and palpitations.  Gastrointestinal: Positive for diarrhea (ceased) and nausea (ceased). Negative for abdominal pain and vomiting.  Genitourinary: Negative for dysuria and hematuria.  Musculoskeletal: Negative for arthralgias, back pain and myalgias.  Skin: Negative for color change and rash.  Neurological: Positive for headaches. Negative for seizures and syncope.  All other systems reviewed and are negative.    Physical Exam Triage Vital Signs ED Triage Vitals  Enc Vitals Group     BP      Pulse      Resp      Temp      Temp src      SpO2      Weight      Height      Head Circumference      Peak Flow      Pain Score      Pain Loc      Pain Edu?      Excl. in [place]?    No data found.  Updated Vital Signs There were no vitals taken for this visit.  Visual Acuity Right Eye Distance:   Left Eye Distance:   Bilateral Distance:    Right Eye Near:   Left Eye Near:    Bilateral Near:     Physical Exam Vitals and nursing note reviewed.  Constitutional:      General: She is not in acute distress.    Appearance: She is well-developed. She is not ill-appearing.  HENT:     Head: Normocephalic and atraumatic.  Eyes:     Conjunctiva/sclera: Conjunctivae normal.     Pupils: Pupils are equal, round, and reactive to light.  Cardiovascular:     Rate and Rhythm: Normal rate and regular rhythm.     Heart sounds: No murmur. No friction rub. No gallop.   Pulmonary:     Effort: Pulmonary effort is normal. No respiratory distress.     Breath sounds: Normal breath sounds. No wheezing, rhonchi or rales.  Abdominal:     Palpations: Abdomen is soft.     Tenderness: There is no abdominal tenderness.  Musculoskeletal:     Cervical back: Neck supple.     Right lower leg: No edema.     Left lower leg: No edema.  Skin:    General: Skin is warm and dry.  Neurological:     General: No  focal deficit present.     Mental Status: She is alert and oriented to person, place, and time.  Psychiatric:        Mood and Affect: Mood normal.  Behavior: Behavior normal.        Thought Content: Thought content normal.        Judgment: Judgment normal.      UC Treatments / Results  Labs (all labs ordered are listed, but only abnormal results are displayed) Labs Reviewed - No data to display  EKG   Radiology No results found.  Procedures Procedures (including critical care time)  Medications Ordered in UC Medications - No data to display  Initial Impression / Assessment and Plan / UC Course  I have reviewed the triage vital signs and the nursing notes.  Pertinent labs & imaging results that were available during my care of the patient were reviewed by me and considered in my medical decision making (see chart for details).     #Headache #Covid Exposure - Headache and mild GI symptoms to this point. No other symptoms. COVID PCR sent. Precautions discussed. Guidelines given. NSAID or Tylenol for headache.    Final Clinical Impressions(s) / UC Diagnoses   Final diagnoses:  None   Discharge Instructions   None    ED Prescriptions    None     PDMP not reviewed this encounter.   Purnell Shoemaker, PA-C 05/05/19 2055

## 2019-05-08 LAB — NOVEL CORONAVIRUS, NAA (HOSP ORDER, SEND-OUT TO REF LAB; TAT 18-24 HRS): SARS-CoV-2, NAA: NOT DETECTED

## 2019-05-17 ENCOUNTER — Other Ambulatory Visit: Payer: Self-pay

## 2019-05-17 ENCOUNTER — Encounter (HOSPITAL_COMMUNITY): Payer: Self-pay | Admitting: Emergency Medicine

## 2019-05-17 ENCOUNTER — Ambulatory Visit (HOSPITAL_COMMUNITY)
Admission: EM | Admit: 2019-05-17 | Discharge: 2019-05-17 | Disposition: A | Discharge status: Home / Self Care | Payer: 59 | Attending: Emergency Medicine | Admitting: Emergency Medicine

## 2019-05-17 DIAGNOSIS — R110 Nausea: Secondary | ICD-10-CM | POA: Diagnosis not present

## 2019-05-17 DIAGNOSIS — R0982 Postnasal drip: Secondary | ICD-10-CM | POA: Insufficient documentation

## 2019-05-17 DIAGNOSIS — R0981 Nasal congestion: Secondary | ICD-10-CM | POA: Insufficient documentation

## 2019-05-17 DIAGNOSIS — Z20822 Contact with and (suspected) exposure to covid-19: Secondary | ICD-10-CM | POA: Diagnosis not present

## 2019-05-17 DIAGNOSIS — E119 Type 2 diabetes mellitus without complications: Secondary | ICD-10-CM | POA: Insufficient documentation

## 2019-05-17 DIAGNOSIS — I10 Essential (primary) hypertension: Secondary | ICD-10-CM | POA: Insufficient documentation

## 2019-05-17 DIAGNOSIS — J45909 Unspecified asthma, uncomplicated: Secondary | ICD-10-CM | POA: Insufficient documentation

## 2019-05-17 DIAGNOSIS — Z87891 Personal history of nicotine dependence: Secondary | ICD-10-CM | POA: Insufficient documentation

## 2019-05-17 DIAGNOSIS — Z7982 Long term (current) use of aspirin: Secondary | ICD-10-CM | POA: Diagnosis not present

## 2019-05-17 DIAGNOSIS — J019 Acute sinusitis, unspecified: Secondary | ICD-10-CM | POA: Diagnosis present

## 2019-05-17 DIAGNOSIS — Z79899 Other long term (current) drug therapy: Secondary | ICD-10-CM | POA: Diagnosis not present

## 2019-05-17 MED ORDER — DM-GUAIFENESIN ER 30-600 MG PO TB12: 1.0000 | ORAL_TABLET | Freq: Two times a day (BID) | ORAL | 0 refills | Status: DC

## 2019-05-17 MED ORDER — AMOXICILLIN-POT CLAVULANATE 875-125 MG PO TABS: 1.0000 | ORAL_TABLET | Freq: Two times a day (BID) | ORAL | 0 refills | Status: AC

## 2019-05-17 MED ORDER — FLUTICASONE PROPIONATE 50 MCG/ACT NA SUSP: 1.0000 | Freq: Every day | NASAL | 0 refills | Status: DC

## 2019-05-17 NOTE — ED Provider Notes (Signed)
[facility]    CSN: [id number] Arrival date & time: 05/17/19  1430      History   Chief Complaint Chief Complaint  Patient presents with  . Sinus Pressure    HPI Theresa Carter is a 49 y.o. female history of hypertension, DM type II, asthma, presenting today for evaluation of sinus congestion and pressure.  Patient states that for the past week she has had pain and pressure within her sinuses.  She has felt very congested and has had a lot of postnasal drainage that is making her nauseous.  She has had a mild cough, but relates this to the drainage, denies chest pain or shortness of breath.  Denies known fevers.  Did have Covid exposure approximately 1 week prior to onset of symptoms, had test here a few days after exposure that was negative.  HPI  Past Medical History:  Diagnosis Date  . Asthma   . Diabetes mellitus without complication ([place])   . Ectopic pregnancy   . Hypertension     Patient Active Problem List   Diagnosis Date Noted  . Chest pain 07/29/2018  . Essential hypertension 07/29/2018  . Asthma 07/29/2018  . Diabetes mellitus type II, non insulin dependent ([place]) 07/29/2018  . Chronic pain 07/29/2018    Past Surgical History:  Procedure Laterality Date  . CHOLECYSTECTOMY    . DILATION AND CURETTAGE OF UTERUS    . ENDOMETRIAL ABLATION    . HIP ARTHROSCOPY     x2  . SALPINGECTOMY    . TRIGGER FINGER RELEASE    . TUBAL LIGATION    . WRIST ARTHROSCOPY      OB History   No obstetric history on file.      Home Medications    Prior to Admission medications   Medication Sig Start Date End Date Taking? Authorizing Provider  albuterol (PROVENTIL) (2.5 MG/3ML) 0.083% nebulizer solution Take 3 mLs (2.5 mg total) by nebulization every 6 (six) hours as needed for wheezing or shortness of breath. 07/30/18   Regalado, Belkys A, MD  amoxicillin-clavulanate (AUGMENTIN) 875-125 MG tablet Take 1 tablet by mouth every 12 (twelve) hours for 7 days.  05/17/19 05/24/19  Avyan Livesay C, PA-C  aspirin EC 81 MG EC tablet Take 1 tablet (81 mg total) by mouth daily. Patient not taking: Reported on 12/16/2018 07/31/18   Regalado, Jerald Kief A, MD  Canagliflozin (INVOKANA PO) Take 1 tablet by mouth daily.    [provider]  cyclobenzaprine (FLEXERIL) 10 MG tablet Take 1 tablet by mouth at bedtime.    [provider]  dextromethorphan-guaiFENesin (MUCINEX DM) 30-600 MG 12hr tablet Take 1 tablet by mouth 2 (two) times daily. 05/17/19   Welden Hausmann C, PA-C  fluticasone (FLONASE) 50 MCG/ACT nasal spray Place 1-2 sprays into both nostrils daily for 7 days. 05/17/19 05/24/19  Mclean Moya C, PA-C  HYDROcodone-acetaminophen (NORCO) 7.5-325 MG tablet Take 1 tablet by mouth every 6 (six) hours as needed for pain.    [provider]  ibuprofen (ADVIL) 800 MG tablet Take 1 tablet (800 mg total) by mouth 3 (three) times daily. 12/03/18   Keiden Deskin C, PA-C  levocetirizine (XYZAL) 5 MG tablet Take 5 mg by mouth every evening.    [provider]  liraglutide (VICTOZA) 18 MG/3ML SOPN Inject 0.6 mg into the skin daily.    [provider]  metFORMIN (GLUCOPHAGE) 1000 MG tablet Take 1 tablet by mouth 2 (two) times daily.    [provider]  omeprazole (PRILOSEC) 40 MG capsule Take 40 mg by mouth daily.    [provider]  ramipril (ALTACE) 10 MG capsule Take 1 capsule by mouth daily.    [provider]  verapamil (VERELAN PM) 180 MG 24 hr capsule Take 1 capsule by mouth daily.    [provider]    Family History Family History  Problem Relation Age of Onset  . Venous thrombosis Mother   . Heart attack Brother   . Venous thrombosis Son     Social History Social History   Tobacco Use  . Smoking status: Former Research scientist (life sciences)  . Smokeless tobacco: Never Used  Substance Use Topics  . Alcohol use: Yes    Comment: RARE  . Drug use: Never     Allergies   Patient has no known  allergies.   Review of Systems Review of Systems  Constitutional: Negative for activity change, appetite change, chills, fatigue and fever.  HENT: Positive for congestion, rhinorrhea, sinus pressure and sore throat. Negative for ear pain and trouble swallowing.   Eyes: Negative for discharge and redness.  Respiratory: Positive for cough. Negative for chest tightness and shortness of breath.   Cardiovascular: Negative for chest pain.  Gastrointestinal: Negative for abdominal pain, diarrhea, nausea and vomiting.  Musculoskeletal: Negative for myalgias.  Skin: Negative for rash.  Neurological: Negative for dizziness, light-headedness and headaches.     Physical Exam Triage Vital Signs ED Triage Vitals  Enc Vitals Group     BP 05/17/19 1531 (!) 145/68     Pulse Rate 05/17/19 1529 85     Resp 05/17/19 1529 18     Temp 05/17/19 1531 98.6 F (37 C)     Temp src --      SpO2 05/17/19 1529 100 %     Weight --      Height --      Head Circumference --      Peak Flow --      Pain Score 05/17/19 1530 5     Pain Loc --      Pain Edu? --      Excl. in [place]? --    No data found.  Updated Vital Signs BP (!) 145/68   Pulse 85   Temp 98.6 F (37 C)   Resp 18   SpO2 100%   Visual Acuity Right Eye Distance:   Left Eye Distance:   Bilateral Distance:    Right Eye Near:   Left Eye Near:    Bilateral Near:     Physical Exam Vitals and nursing note reviewed.  Constitutional:      General: She is not in acute distress.    Appearance: She is well-developed.  HENT:     Head: Normocephalic and atraumatic.     Ears:     Comments: Bilateral ears without tenderness to palpation of external auricle, tragus and mastoid, EAC's without erythema or swelling, TM's with good bony landmarks and cone of light. Non erythematous.     Nose:     Comments: Nose mucosa erythematous, swollen turbinates bilaterally with rhinorrhea present    Mouth/Throat:     Comments: Oral mucosa pink and moist,  no tonsillar enlargement or exudate. Posterior pharynx patent and nonerythematous, no uvula deviation or swelling. Normal phonation. Eyes:     Conjunctiva/sclera: Conjunctivae normal.  Cardiovascular:     Rate and Rhythm: Normal rate and regular rhythm.     Heart sounds: No murmur.  Pulmonary:  Effort: Pulmonary effort is normal. No respiratory distress.     Breath sounds: Normal breath sounds.     Comments: Breathing comfortably at rest, CTABL, no wheezing, rales or other adventitious sounds auscultated Abdominal:     Palpations: Abdomen is soft.     Tenderness: There is no abdominal tenderness.  Musculoskeletal:     Cervical back: Neck supple.  Skin:    General: Skin is warm and dry.  Neurological:     Mental Status: She is alert.      UC Treatments / Results  Labs (all labs ordered are listed, but only abnormal results are displayed) Labs Reviewed  NOVEL CORONAVIRUS, NAA (HOSP ORDER, SEND-OUT TO REF LAB; TAT 18-24 HRS)    EKG   Radiology No results found.  Procedures Procedures (including critical care time)  Medications Ordered in UC Medications - No data to display  Initial Impression / Assessment and Plan / UC Course  I have reviewed the triage vital signs and the nursing notes.  Pertinent labs & imaging results that were available during my care of the patient were reviewed by me and considered in my medical decision making (see chart for details).     Sinusitis symptoms greater than 1 week, we will go ahead and cover for sinusitis with Augmentin while continuing symptomatic and supportive care.  Repeating Covid PCR given exposure prior to rule out possible false negative previously.  Continue to quarantine, rest and push fluids.  Discussed strict return precautions. Patient verbalized understanding and is agreeable with plan.  Final Clinical Impressions(s) / UC Diagnoses   Final diagnoses:  Acute non-recurrent sinusitis, unspecified location      Discharge Instructions     Repeat Covid swab pending, monitor my chart for results Begin Augmentin twice daily for the next week Continue daily Zyrtec, add in Flonase nasal spray 1 to 2 spray in each nostril daily May try Mucinex as alternative for congestion/drainage Tylenol and ibuprofen as needed for headache Rest and drink plenty of fluids  Please follow-up if symptoms continuing to not improve or worsen   ED Prescriptions    Medication Sig Dispense Auth. Provider   amoxicillin-clavulanate (AUGMENTIN) 875-125 MG tablet Take 1 tablet by mouth every 12 (twelve) hours for 7 days. 14 tablet Stephanee Barcomb C, PA-C   fluticasone (FLONASE) 50 MCG/ACT nasal spray Place 1-2 sprays into both nostrils daily for 7 days. 1 g Leeba Barbe C, PA-C   dextromethorphan-guaiFENesin (MUCINEX DM) 30-600 MG 12hr tablet Take 1 tablet by mouth 2 (two) times daily. 20 tablet Cherrill Scrima, [place] C, PA-C     PDMP not reviewed this encounter.   Janith Lima, [place] 05/17/19 1554

## 2019-05-17 NOTE — ED Triage Notes (Signed)
Pt states she has sinus pressure. Symptoms x1 week, facial pressure. Pt states she was exposed a few weeks ago to covid, was only tested 3 days after exposure. Symptoms fall within the 14 day period of exposure.

## 2019-05-17 NOTE — Discharge Instructions (Signed)
Repeat Covid swab pending, monitor my chart for results Begin Augmentin twice daily for the next week Continue daily Zyrtec, add in Flonase nasal spray 1 to 2 spray in each nostril daily May try Mucinex as alternative for congestion/drainage Tylenol and ibuprofen as needed for headache Rest and drink plenty of fluids  Please follow-up if symptoms continuing to not improve or worsen

## 2019-05-18 LAB — NOVEL CORONAVIRUS, NAA (HOSP ORDER, SEND-OUT TO REF LAB; TAT 18-24 HRS): SARS-CoV-2, NAA: NOT DETECTED

## 2019-08-11 ENCOUNTER — Other Ambulatory Visit: Payer: Self-pay

## 2019-08-11 ENCOUNTER — Encounter: Payer: Self-pay | Admitting: Internal Medicine

## 2019-08-11 ENCOUNTER — Ambulatory Visit: Payer: 59 | Admitting: Internal Medicine

## 2019-08-11 VITALS — BP 150/85 | HR 99 | Temp 98.1°F | Ht 65.0 in | Wt 228.2 lb

## 2019-08-11 DIAGNOSIS — L732 Hidradenitis suppurativa: Secondary | ICD-10-CM | POA: Diagnosis not present

## 2019-08-11 DIAGNOSIS — T7840XA Allergy, unspecified, initial encounter: Secondary | ICD-10-CM | POA: Diagnosis not present

## 2019-08-11 MED ORDER — CLINDAMYCIN PHOSPHATE 1 % EX SOLN: Freq: Two times a day (BID) | CUTANEOUS | 1 refills | Status: DC

## 2019-08-11 NOTE — Progress Notes (Signed)
   CC: underarm boil and establish care  HPI:  [name] is a 49 y.o. F with significant PMH of asthma, hypertension, diabetes, and recurrent skin boils presents to establish care and for an acute underarm boil. Please see problem-based charting for additional information.   Past Medical History:  Diagnosis Date  . Asthma   . Diabetes mellitus without complication ([place])   . Ectopic pregnancy   . Hypertension    Social History   Tobacco Use  . Smoking status: Former Research scientist (life sciences)  . Smokeless tobacco: Never Used  Substance Use Topics  . Alcohol use: Yes    Comment: RARE  . Drug use: Never   Family History  Problem Relation Age of Onset  . Venous thrombosis Mother   . Heart attack Brother   . Venous thrombosis Son    Review of Systems:  . Review of Systems  Constitutional: Negative for chills, fever and malaise/fatigue.  Respiratory: Negative for shortness of breath.   Cardiovascular: Negative for chest pain and palpitations.  Gastrointestinal: Negative for abdominal pain, constipation, diarrhea, nausea and vomiting.  Genitourinary: Negative for dysuria and frequency.  Skin:       Recurrent painful boils in groin and underarm  Neurological: Negative for dizziness, weakness and headaches.   Physical Exam:  Vitals:   08/11/19 1436  BP: (!) 150/85  Pulse: 99  Temp: 98.1 F (36.7 C)  TempSrc: Oral  SpO2: 100%  Weight: 228 lb 3.2 oz (103.5 kg)  Height: 5\' 5"  (1.651 m)   Physical Exam Vitals and nursing note reviewed.  Constitutional:      General: She is not in acute distress.    Appearance: Normal appearance. She is obese. She is not ill-appearing.  Cardiovascular:     Rate and Rhythm: Normal rate and regular rhythm.     Heart sounds: Normal heart sounds.  Pulmonary:     Effort: Pulmonary effort is normal. No respiratory distress.     Breath sounds: Normal breath sounds. No wheezing, rhonchi or rales.  Genitourinary:    Comments: 1.5cm lump on outside  of left labia in skin fold. Some spotted pus on overlying gauze. No active drainage able to be expressed. Tender to palpation. No erythema or warmth. No fluctuance appreciated. No surrounding lymphadenopathy.  Skin:    Comments: Right underarm with 1cm pustule without active drainage. Tender to palpation. No warmth or surrounding erythema. Evidence of chronic hyperpigmented scarring in bilateral underarms.  Neurological:     Mental Status: She is alert.    Assessment & Plan:   See Encounters Tab for problem based charting.  Patient discussed with Dr. Lynnae January

## 2019-08-11 NOTE — Patient Instructions (Addendum)
Ms. Cedotal,  It was nice meeting you today!   In looking at your skin, I believe your boils are due to an autoimmune condition called hidradenitis suppurativa. Please begin a topical antibiotic (clindamycin) to the affected areas twice a day. I have also placed a referral to dermatology for continued management of this condition.  Please follow-up in our clinic in 1 month to meet your new primary doctor and follow-up your diabetes and high blood pressure.  Thank you for letting us be a part of your care!  Hidradenitis Suppurativa Hidradenitis suppurativa is a long-term (chronic) skin disease. It is similar to a severe form of acne, but it affects areas of the body where acne would be unusual, especially areas of the body where skin rubs against skin and becomes moist. These include:  Underarms.  Groin.  Genital area.  Buttocks.  Upper thighs.  Breasts. Hidradenitis suppurativa may start out as small lumps or pimples caused by blocked sweat glands or hair follicles. Pimples may develop into deep sores that break open (rupture) and drain pus. Over time, affected areas of skin may thicken and become scarred. This condition is rare and does not spread from person to person (non-contagious). What are the causes? The exact cause of this condition is not known. It may be related to:  Female and female hormones.  An overactive disease-fighting system (immune system). The immune system may over-react to blocked hair follicles or sweat glands and cause swelling and pus-filled sores. What increases the risk? You are more likely to develop this condition if you:  Are female.  Are 35-87 years old.  Have a family history of hidradenitis suppurativa.  Have a personal history of acne.  Are overweight.  Smoke.  Take the medicine lithium. What are the signs or symptoms? The first symptoms are usually painful bumps in the skin, similar to pimples. The condition may get worse over time  (progress), or it may only cause mild symptoms. If the disease progresses, symptoms may include:  Skin bumps getting bigger and growing deeper into the skin.  Bumps rupturing and draining pus.  Itchy, infected skin.  Skin getting thicker and scarred.  Tunnels under the skin (fistulas) where pus drains from a bump.  Pain during daily activities, such as pain during walking if your groin area is affected.  Emotional problems, such as stress or depression. This condition may affect your appearance and your ability or willingness to wear certain clothes or do certain activities. How is this diagnosed? This condition is diagnosed by a health care provider who specializes in skin diseases (dermatologist). You may be diagnosed based on:  Your symptoms and medical history.  A physical exam.  Testing a pus sample for infection.  Blood tests. How is this treated? Your treatment will depend on how severe your symptoms are. The same treatment will not work for everybody with this condition. You may need to try several treatments to find what works best for you. Treatment may include:  Cleaning and bandaging (dressing) your wounds as needed.  Lifestyle changes, such as new skin care routines.  Taking medicines, such as: ? Antibiotics. ? Acne medicines. ? Medicines to reduce the activity of the immune system. ? A diabetes medicine (metformin). ? Birth control pills, for women. ? Steroids to reduce swelling and pain.  Working with a mental health care provider, if you experience emotional distress due to this condition. If you have severe symptoms that do not get better with medicine, you may  need surgery. Surgery may involve:  Using a laser to clear the skin and remove hair follicles.  Opening and draining deep sores.  Removing the areas of skin that are diseased and scarred. Follow these instructions at home: Medicines   Take over-the-counter and prescription medicines only as  told by your health care provider.  If you were prescribed an antibiotic medicine, take it as told by your health care provider. Do not stop taking the antibiotic even if your condition improves. Skin care  If you have open wounds, cover them with a clean dressing as told by your health care provider. Keep wounds clean by washing them gently with soap and water when you bathe.  Do not shave the areas where you get hidradenitis suppurativa.  Do not wear deodorant.  Wear loose-fitting clothes.  Try to avoid getting overheated or sweaty. If you get sweaty or wet, change into clean, dry clothes as soon as you can.  To help relieve pain and itchiness, cover sore areas with a warm, clean washcloth (warm compress) for 5-10 minutes as often as needed.  If told by your health care provider, take a bleach bath twice a week: ? Fill your bathtub halfway with water. ? Pour in  cup of unscented household bleach. ? Soak in the tub for 5-10 minutes. ? Only soak from the neck down. Avoid water on your face and hair. ? Shower to rinse off the bleach from your skin. General instructions  Learn as much as you can about your disease so that you have an active role in your treatment. Work closely with your health care provider to find treatments that work for you.  If you are overweight, work with your health care provider to lose weight as recommended.  Do not use any products that contain nicotine or tobacco, such as cigarettes and e-cigarettes. If you need help quitting, ask your health care provider.  If you struggle with living with this condition, talk with your health care provider or work with a mental health care provider as recommended.  Keep all follow-up visits as told by your health care provider. This is important. Where to find more information  Hidradenitis [place].: https://www.hs-foundation.org/ Contact a health care provider if you have:  A flare-up of  hidradenitis suppurativa.  A fever or chills.  Trouble controlling your symptoms at home.  Trouble doing your daily activities because of your symptoms.  Trouble dealing with emotional problems related to your condition. Summary  Hidradenitis suppurativa is a long-term (chronic) skin disease. It is similar to a severe form of acne, but it affects areas of the body where acne would be unusual.  The first symptoms are usually painful bumps in the skin, similar to pimples. The condition may get worse over time (progress), or it may only cause mild symptoms.  If you have open wounds, cover them with a clean dressing as told by your health care provider. Keep wounds clean by washing them gently with soap and water when you bathe.  Besides skin care, treatment may include medicines, laser treatment, and surgery. This information is not intended to replace advice given to you by your health care provider. Make sure you discuss any questions you have with your health care provider. Document Revised: 04/17/2017 Document Reviewed: 04/17/2017 Elsevier Patient Education  2020 [employer].

## 2019-08-12 ENCOUNTER — Telehealth: Payer: Self-pay | Admitting: Internal Medicine

## 2019-08-12 MED ORDER — DOXYCYCLINE HYCLATE 100 MG PO TBEC: 100.0000 mg | DELAYED_RELEASE_TABLET | Freq: Every day | ORAL | 0 refills | Status: AC

## 2019-08-12 NOTE — Telephone Encounter (Signed)
Pls contact pt regarding medicine [phone]

## 2019-08-12 NOTE — Telephone Encounter (Signed)
Pt notified to discontinue topical clindamycin and begin 10 day course of oral doxycycline, once daily dosing.  Medication desired effect and potential side effects discussed and pt verbalized understanding.  Informed pt referral to dermatology is in process for continued care of hidradenitis. SChaplin, RN,BSN

## 2019-08-12 NOTE — Telephone Encounter (Signed)
Pt can discontinue topical clindamycin and begin a 10 day course of oral doxycycline (100mg  once a day) for hidradenitis. Referral in process for dermatology, who can follow-up clinical response.

## 2019-08-12 NOTE — Telephone Encounter (Signed)
RTC, patient was seen in [facility] yesterday by Dr. Ronnald Ramp for boils on her skin and was given topical clindamycin. Pt states the topical medication made her "skin burn like it was on fire and she can't use this".  She is requesting another medication and asked for oral ABX. Will send to Dr. Eleonore Chiquito, RN,BSN

## 2019-08-28 NOTE — Assessment & Plan Note (Signed)
Pt presents for painful boils in right underarm and left groin. States she has had recurrent boils and abscesses in these areas since she was 16. Some previously required I&D. Her son has similar issues as well. She is unable to recall a time when she did not have an active boil or lump. Endorses that they are always present in the groin or underarms and never anywhere else on the body. Has been treating them at home with warm compresses and tylenol for the pain. Denies fevers, chills, nausea, abdominal pain, weakness or fatigue. On examination, 1cm pustule under the right underarm without active drainage and 1.5cm lump in left groin skin fold with small central ulceration though no expressed drainage. Pt's bilateral underarms with chronic hyperpigmented scarring consistent with hidradenitis suppurativa. Pt cannot recall hearing the term hidradenitis suppurativa before. Discussed diagnosis of the inflammatory skin condition. Pt already on metformin, so discussed utility of weight loss, good skin hygiene, and topical antibiotics to start treatment. Given prolonged history and findings on exam, will refer to dermatology for advanced treatment options as well.   - begin topical clindamycin  - referral to dermatology

## 2019-08-31 NOTE — Progress Notes (Signed)
Internal Medicine Clinic Attending  Case discussed with Dr. Jones at the time of the visit.  We reviewed the resident's history and exam and pertinent patient test results.  I agree with the assessment, diagnosis, and plan of care documented in the resident's note.  

## 2019-09-16 ENCOUNTER — Other Ambulatory Visit: Payer: Self-pay

## 2019-09-16 ENCOUNTER — Ambulatory Visit: Payer: 59 | Admitting: Internal Medicine

## 2019-09-16 ENCOUNTER — Encounter: Payer: Self-pay | Admitting: Internal Medicine

## 2019-09-16 VITALS — BP 121/80 | HR 102 | Temp 98.9°F | Ht 65.0 in | Wt 230.3 lb

## 2019-09-16 DIAGNOSIS — E119 Type 2 diabetes mellitus without complications: Secondary | ICD-10-CM | POA: Diagnosis not present

## 2019-09-16 DIAGNOSIS — I10 Essential (primary) hypertension: Secondary | ICD-10-CM | POA: Diagnosis not present

## 2019-09-16 DIAGNOSIS — J45909 Unspecified asthma, uncomplicated: Secondary | ICD-10-CM | POA: Diagnosis not present

## 2019-09-16 DIAGNOSIS — L732 Hidradenitis suppurativa: Secondary | ICD-10-CM

## 2019-09-16 DIAGNOSIS — Z7984 Long term (current) use of oral hypoglycemic drugs: Secondary | ICD-10-CM

## 2019-09-16 DIAGNOSIS — J4530 Mild persistent asthma, uncomplicated: Secondary | ICD-10-CM

## 2019-09-16 LAB — POCT GLYCOSYLATED HEMOGLOBIN (HGB A1C): Hemoglobin A1C: 6.7 % — AB (ref 4.0–5.6)

## 2019-09-16 LAB — GLUCOSE, CAPILLARY: Glucose-Capillary: 165 mg/dL — ABNORMAL HIGH (ref 70–99)

## 2019-09-16 MED ORDER — FLOVENT DISKUS 50 MCG/BLIST IN AEPB: 1.0000 | INHALATION_SPRAY | Freq: Two times a day (BID) | RESPIRATORY_TRACT | 3 refills | Status: DC

## 2019-09-16 MED ORDER — CLINDAMYCIN HCL 300 MG PO CAPS: 300.0000 mg | ORAL_CAPSULE | Freq: Two times a day (BID) | ORAL | 2 refills | Status: AC

## 2019-09-16 MED ORDER — RIFAMPIN 300 MG PO CAPS: 300.0000 mg | ORAL_CAPSULE | Freq: Two times a day (BID) | ORAL | 2 refills | Status: DC

## 2019-09-16 NOTE — Patient Instructions (Signed)
It was nice seeing you today! Thank you for choosing [facility] for your Primary Care.    Today we talked about:   1. Diabetes: Great job on your A1c! No medication changes planned today\  2. High Blood Pressure: We are going to remove the Verapamil as it likely is not contributing to your BP control much. Please check your blood pressure periodically in the next few weeks. If over 140/90, call the clinic  3. Asthma: I added Flovent to your medication list. Please use twice daily. If your asthma and difficulty breathing is not better controlled in the next 1-2 weeks, please contact the clinic.   4. I will follow up how to get you connected to the [facility] clinic

## 2019-09-16 NOTE — Progress Notes (Signed)
   CC: Hydradenitis suppurativa  HPI:  Theresa Carter is a 49 y.o. with a PMHx of type 2 diabetes, hypertension who presents to the clinic for hidradenitis suppurativa follow-up.   Please see the Encounters tab for problem-based Assessment & Plan regarding status of patient's chronic conditions.  Past Medical History:  Diagnosis Date  . Asthma   . Diabetes mellitus without complication ([place])   . Ectopic pregnancy   . Hypertension    Review of Systems: Review of Systems  Constitutional: Negative for chills, fever and weight loss.  Respiratory: Positive for cough, shortness of breath and wheezing.   Cardiovascular: Negative for chest pain, orthopnea and leg swelling.  Gastrointestinal: Negative for abdominal pain, diarrhea, nausea and vomiting.  Genitourinary: Negative for dysuria and urgency.  Musculoskeletal: Positive for joint pain (Hip pain) and myalgias.  Skin:       Boils, drainage  Neurological: Negative for dizziness, focal weakness and headaches.   Physical Exam:  Vitals:   09/16/19 1436  BP: 121/80  Pulse: (!) 102  Temp: 98.9 F (37.2 C)  TempSrc: Oral  SpO2: 98%  Weight: 230 lb 4.8 oz (104.5 kg)  Height: 5\' 5"  (1.651 m)    Physical Exam Vitals and nursing note reviewed.  Constitutional:      General: She is not in acute distress.    Appearance: She is normal weight.  Cardiovascular:     Rate and Rhythm: Normal rate and regular rhythm.     Heart sounds: No murmur.  Pulmonary:     Effort: Pulmonary effort is normal. No respiratory distress.     Breath sounds: No wheezing or rales.  Skin:    General: Skin is warm and dry.     Comments: Right axilla: Several erythematous boils with one having active purulent drainage that is odorless.  Extensive scarring with sinus tract present underneath.  Neurological:     General: No focal deficit present.     Mental Status: She is alert and oriented to person, place, and time. Mental status is at baseline.    Psychiatric:        Mood and Affect: Mood normal.        Behavior: Behavior normal.    Assessment & Plan:   See Encounters Tab for problem based charting.  Patient discussed with Dr. Evette Doffing

## 2019-09-16 NOTE — Assessment & Plan Note (Addendum)
Ms. Bents states that she tried the topical clindamycin but the pain was excruciating and described as a burning sensation.  After that she was trialed on a 10-day course of doxycycline, which she states did not even touch her symptoms at all.  She continues to have worsening purulent drainage in her right axilla and left vaginal-gluteal cleft.  Patient denies any fevers, chills, foul-smelling odor from drainage, increased pain.  She is interested in reaching out to [facility] dedicated clinic.  Assessment: Given that patient's symptoms are severe enough to be interfering with her life significantly, advancing therapy is indicated. Next line therapy includes 3 months of clindamycin and rifampin.  Discussed this with patient including side effect profile, and she is in agreement to move forward with this.  Encourage Ms. Dondlinger to schedule a dermatology visit as soonest available.  Plan: -Clindamycin 300 mg twice daily  -Rifampin 300 mg twice daily -CBC pending

## 2019-09-16 NOTE — Assessment & Plan Note (Signed)
BP: 121/80   Theresa Carter denies any chest pain, dizziness, headache.  She states she has a blood pressure cuff at home and is able to measure her own blood pressure.  She has not had any problems with her current medication regimen, which she states she has been on since at least 2013 or 2014.  Assessment: Blood pressure well controlled today.  Uncertain if verapamil is providing any blood pressure control though and I suspect she will have no issues with discontinuing it.  Discussed this with Theresa Carter who is in agreement with plan.  Plan: -Discontinue verapamil -Continue ramipril 10 mg daily -Home BP check 2-3 times per week

## 2019-09-16 NOTE — Assessment & Plan Note (Addendum)
Lab Results  Component Value Date   HGBA1C 6.7 (A) 09/16/2019   Theresa Carter denies any problems with her medications at this time.  She denies any symptoms of hypoglycemia.  Assessment + Plan:  -Continue with current regimen, that includes Victoza, Invokana, Metformin -BMP today to assess for kidney disease

## 2019-09-16 NOTE — Assessment & Plan Note (Signed)
Theresa Carter states that her asthma has been increasingly uncontrolled in the past few weeks especially with allergy season coming up.  She has been trying to take cetirizine and fluticasone daily and this has not helped her asthma.  She endorses symptoms including shortness of breath, wheezing, cough, throat swelling.  She is having to use her albuterol inhaler several times per day rather than only periodically as she could before.  She states usually Singulair helps with the symptoms but the medication was previously discontinued by another physician.  She notes she has been on inhaled corticosteroids before without significant improvement but is willing to try again.  Assessment + Plan:  -Continue albuterol as needed for acute shortness of breath -Start Flovent 1 puff twice daily.  May require titration for symptomatic control -We will discuss at next visit

## 2019-09-17 LAB — CBC
Hematocrit: 37.1 % (ref 34.0–46.6)
Hemoglobin: 12.3 g/dL (ref 11.1–15.9)
MCH: 30 pg (ref 26.6–33.0)
MCHC: 33.2 g/dL (ref 31.5–35.7)
MCV: 91 fL (ref 79–97)
Platelets: 410 10*3/uL (ref 150–450)
RBC: 4.1 x10E6/uL (ref 3.77–5.28)
RDW: 13.1 % (ref 11.7–15.4)
WBC: 9 10*3/uL (ref 3.4–10.8)

## 2019-09-17 LAB — BMP8+ANION GAP
Anion Gap: 15 mmol/L (ref 10.0–18.0)
BUN/Creatinine Ratio: 12 (ref 9–23)
BUN: 8 mg/dL (ref 6–24)
CO2: 23 mmol/L (ref 20–29)
Calcium: 9.5 mg/dL (ref 8.7–10.2)
Chloride: 100 mmol/L (ref 96–106)
Creatinine, Ser: 0.65 mg/dL (ref 0.57–1.00)
GFR calc Af Amer: 122 mL/min/{1.73_m2} (ref >59)
GFR calc non Af Amer: 106 mL/min/{1.73_m2} (ref >59)
Glucose: 155 mg/dL — ABNORMAL HIGH (ref 65–99)
Potassium: 4.4 mmol/L (ref 3.5–5.2)
Sodium: 138 mmol/L (ref 134–144)

## 2019-09-17 NOTE — Progress Notes (Signed)
Internal Medicine Clinic Attending  Case discussed with Dr. Basaraba at the time of the visit.  We reviewed the resident's history and exam and pertinent patient test results.  I agree with the assessment, diagnosis, and plan of care documented in the resident's note.    

## 2019-10-01 ENCOUNTER — Ambulatory Visit: Payer: 59 | Attending: Family

## 2019-10-01 DIAGNOSIS — Z23 Encounter for immunization: Secondary | ICD-10-CM

## 2019-10-01 NOTE — Progress Notes (Signed)
   Covid-19 Vaccination Clinic  Name:  Theresa Carter    MRN: [id number] DOB: 1971-02-04  10/01/2019  Ms. Goller was observed post Covid-19 immunization for 15 minutes without incident. She was provided with Vaccine Information Sheet and instruction to access the V-Safe system.   Ms. Beadles was instructed to call 911 with any severe reactions post vaccine: Marland Kitchen Difficulty breathing  . Swelling of face and throat  . A fast heartbeat  . A bad rash all over body  . Dizziness and weakness   Immunizations Administered    Name Date Dose VIS Date Route   Moderna COVID-19 Vaccine 10/01/2019  3:33 PM 0.5 mL 03/2019 Intramuscular   Manufacturer: Levan Hurst   Lot: [id number]   [place]: 12244-975-30

## 2019-10-21 ENCOUNTER — Encounter: Payer: 59 | Admitting: Internal Medicine

## 2019-10-23 ENCOUNTER — Encounter: Payer: Self-pay | Admitting: Internal Medicine

## 2019-10-27 ENCOUNTER — Ambulatory Visit: Payer: 59

## 2019-10-27 ENCOUNTER — Ambulatory Visit: Payer: 59 | Attending: Family

## 2019-10-27 DIAGNOSIS — Z23 Encounter for immunization: Secondary | ICD-10-CM

## 2019-10-27 NOTE — Progress Notes (Signed)
   Covid-19 Vaccination Clinic  Name:  Theresa Carter    MRN: [id number] DOB: [birth year]  10/27/2019  [name] was observed post Covid-19 immunization for 15 minutes without incident. She was provided with Vaccine Information Sheet and instruction to access the V-Safe system.   [name] was instructed to call 911 with any severe reactions post vaccine: Marland Kitchen Difficulty breathing  . Swelling of face and throat  . A fast heartbeat  . A bad rash all over body  . Dizziness and weakness   Immunizations Administered    Name Date Dose VIS Date Route   Moderna COVID-19 Vaccine 10/27/2019  4:20 PM 0.5 mL 03/2019 Intramuscular   Manufacturer: Moderna   Lot: [id number]   [place]: [id number]

## 2019-10-29 ENCOUNTER — Encounter: Payer: Self-pay | Admitting: Internal Medicine

## 2019-10-30 ENCOUNTER — Other Ambulatory Visit: Payer: Self-pay | Admitting: Internal Medicine

## 2019-10-30 MED ORDER — FLUTICASONE PROPIONATE 50 MCG/ACT NA SUSP: 1.0000 | Freq: Every day | NASAL | 0 refills | Status: DC

## 2019-11-06 ENCOUNTER — Encounter: Payer: Self-pay | Admitting: Internal Medicine

## 2019-11-06 DIAGNOSIS — E119 Type 2 diabetes mellitus without complications: Secondary | ICD-10-CM

## 2019-11-06 DIAGNOSIS — G8929 Other chronic pain: Secondary | ICD-10-CM

## 2019-11-06 NOTE — Telephone Encounter (Signed)
Spoke with Theresa Carter regarding hip pain. She mentions that this is a chronic hip pain. Had previously had arthroscopy performed x2. She mentions that her pain appears to be gradually worsening. It had previously been well managed with physical therapy. Discussed restarting physical therapy. Theresa Carter expressed understanding.

## 2019-11-09 MED ORDER — METFORMIN HCL 1000 MG PO TABS: 1000.0000 mg | ORAL_TABLET | Freq: Two times a day (BID) | ORAL | 3 refills | Status: DC

## 2019-11-23 ENCOUNTER — Ambulatory Visit: Payer: 59 | Admitting: Internal Medicine

## 2019-11-23 ENCOUNTER — Encounter: Payer: Self-pay | Admitting: Internal Medicine

## 2019-11-23 VITALS — BP 135/85 | HR 92 | Wt 228.6 lb

## 2019-11-23 DIAGNOSIS — T7840XS Allergy, unspecified, sequela: Secondary | ICD-10-CM

## 2019-11-23 DIAGNOSIS — T7840XA Allergy, unspecified, initial encounter: Secondary | ICD-10-CM

## 2019-11-23 DIAGNOSIS — L732 Hidradenitis suppurativa: Secondary | ICD-10-CM

## 2019-11-23 DIAGNOSIS — E119 Type 2 diabetes mellitus without complications: Secondary | ICD-10-CM | POA: Diagnosis not present

## 2019-11-23 DIAGNOSIS — J4520 Mild intermittent asthma, uncomplicated: Secondary | ICD-10-CM

## 2019-11-23 DIAGNOSIS — Z1231 Encounter for screening mammogram for malignant neoplasm of breast: Secondary | ICD-10-CM

## 2019-11-23 DIAGNOSIS — I10 Essential (primary) hypertension: Secondary | ICD-10-CM

## 2019-11-23 DIAGNOSIS — J45909 Unspecified asthma, uncomplicated: Secondary | ICD-10-CM | POA: Diagnosis not present

## 2019-11-23 DIAGNOSIS — M25522 Pain in left elbow: Secondary | ICD-10-CM

## 2019-11-23 MED ORDER — RAMIPRIL 10 MG PO CAPS: 20.0000 mg | ORAL_CAPSULE | Freq: Every day | ORAL | 3 refills | Status: DC

## 2019-11-23 MED ORDER — LIRAGLUTIDE 18 MG/3ML ~~LOC~~ SOPN: 1.8000 mg | PEN_INJECTOR | Freq: Every day | SUBCUTANEOUS | 2 refills | Status: DC

## 2019-11-23 MED ORDER — FLUTICASONE PROPIONATE 50 MCG/ACT NA SUSP: 1.0000 | Freq: Every day | NASAL | 3 refills | Status: DC

## 2019-11-23 MED ORDER — OMEPRAZOLE 40 MG PO CPDR: 40.0000 mg | DELAYED_RELEASE_CAPSULE | Freq: Every day | ORAL | 3 refills | Status: DC

## 2019-11-23 MED ORDER — ALBUTEROL SULFATE (2.5 MG/3ML) 0.083% IN NEBU: 2.5000 mg | INHALATION_SOLUTION | Freq: Four times a day (QID) | RESPIRATORY_TRACT | 12 refills | Status: DC | PRN

## 2019-11-23 MED ORDER — CANAGLIFLOZIN 300 MG PO TABS
300.0000 mg | ORAL_TABLET | Freq: Every day | ORAL | 3 refills | Status: DC
Start: 2019-11-23 — End: 2020-06-07

## 2019-11-23 MED ORDER — LIRAGLUTIDE 18 MG/3ML ~~LOC~~ SOPN: 0.6000 mg | PEN_INJECTOR | Freq: Every day | SUBCUTANEOUS | 2 refills | Status: DC

## 2019-11-23 MED ORDER — RAMIPRIL 10 MG PO CAPS: 10.0000 mg | ORAL_CAPSULE | Freq: Every day | ORAL | 3 refills | Status: DC

## 2019-11-23 MED ORDER — MONTELUKAST SODIUM 10 MG PO TABS
10.0000 mg | ORAL_TABLET | Freq: Every day | ORAL | 1 refills | Status: DC
Start: 2019-11-23 — End: 2020-05-23

## 2019-11-23 NOTE — Patient Instructions (Addendum)
It was nice seeing you today! Thank you for choosing [facility] for your Primary Care.    Today we talked about:   1. Diabetes: No medication changes today. We will check your A1c at your next visit.   2. High blood pressure: I increased your Ramipril to two tablets per day. Please check your blood pressure at home at least 2-3 per week.   3. HS: Continue taking Clindamycin daily till your prescription runs out  4. I sent in a prescription for Singulair

## 2019-11-24 ENCOUNTER — Telehealth: Payer: Self-pay | Admitting: *Deleted

## 2019-11-24 NOTE — Telephone Encounter (Addendum)
Information was sent through CoverMyMeds for PA for Victoza.  Awaiting determination.  Sander Nephew, RN 11/24/2019 4:49 PM.  PA for Victoza was approved 11/24/2019 through 11/23/2020 Sander Nephew RN 11/25/2019 8:47 AM

## 2019-11-24 NOTE — Progress Notes (Signed)
   CC: HTN and HS follow up  HPI:  [name] is a 49 y.o. with a PMHx of T2DM, HTN, HS who presents to the clinic for HTN and HS follow up.   Please see the Encounters tab for problem-based Assessment & Plan regarding status of patient's acute and chronic conditions.  Past Medical History:  Diagnosis Date  . Asthma   . Chest pain 07/29/2018  . Diabetes mellitus without complication ([place])   . Ectopic pregnancy   . Hypertension    Review of Systems: Review of Systems  Constitutional: Negative for chills and fever.  Respiratory: Positive for cough, shortness of breath and wheezing.   Cardiovascular: Negative for chest pain and palpitations.  Gastrointestinal: Negative for abdominal pain, diarrhea, nausea and vomiting.  Musculoskeletal: Positive for joint pain (left hip, chronic).  Skin:       + boils, healed axilla + boils, weeping, groin   Neurological: Negative for dizziness, focal weakness and weakness.   Physical Exam:  Vitals:   11/23/19 1426  BP: 135/85  Pulse: 92  SpO2: 99%  Weight: 228 lb 9.6 oz (103.7 kg)   Physical Exam Vitals and nursing note reviewed.  Constitutional:      General: She is not in acute distress.    Appearance: She is obese.  Cardiovascular:     Rate and Rhythm: Normal rate and regular rhythm.     Heart sounds: No murmur heard.   Pulmonary:     Effort: Pulmonary effort is normal. No respiratory distress.     Breath sounds: Normal breath sounds. No wheezing or rales.  Musculoskeletal:     Left elbow: Tenderness (lateral aspect) present.     Right lower leg: No edema.     Left lower leg: No edema.  Skin:    General: Skin is warm and dry.     Comments: Healed boils in bilateral axilla with scarring from previously sinus tracts  Neurological:     General: No focal deficit present.     Mental Status: She is alert and oriented to person, place, and time. Mental status is at baseline.  Psychiatric:        Mood and Affect: Mood  normal.        Behavior: Behavior normal.     Assessment & Plan:   See Encounters Tab for problem based charting.  Patient discussed with Dr. Philipp Ovens

## 2019-11-25 ENCOUNTER — Ambulatory Visit: Payer: 59 | Attending: Internal Medicine | Admitting: Physical Therapy

## 2019-11-25 ENCOUNTER — Encounter: Payer: Self-pay | Admitting: Physical Therapy

## 2019-11-25 ENCOUNTER — Other Ambulatory Visit: Payer: Self-pay

## 2019-11-25 DIAGNOSIS — M25552 Pain in left hip: Secondary | ICD-10-CM | POA: Diagnosis not present

## 2019-11-25 DIAGNOSIS — M25522 Pain in left elbow: Secondary | ICD-10-CM | POA: Insufficient documentation

## 2019-11-25 DIAGNOSIS — R262 Difficulty in walking, not elsewhere classified: Secondary | ICD-10-CM | POA: Insufficient documentation

## 2019-11-25 DIAGNOSIS — M6281 Muscle weakness (generalized): Secondary | ICD-10-CM | POA: Insufficient documentation

## 2019-11-25 MED ORDER — CLINDAMYCIN HCL 300 MG PO CAPS
300.0000 mg | ORAL_CAPSULE | Freq: Two times a day (BID) | ORAL | 2 refills | Status: DC
Start: 2019-11-25 — End: 2019-12-21

## 2019-11-25 NOTE — Assessment & Plan Note (Signed)
Theresa Carter states that she has been having on and off left elbow pain for several months now.  The pain is worst on the lateral aspect and is exacerbated by palpation.  She cannot identify any motions that exacerbate the pain.  She denies any erythema surrounding the affected area, swelling, deformity.  She denies any trauma to the region.  She denies any repetitive motions throughout the day; she is not currently working.  Pain is usually alleviated by Tylenol.  Assessment/plan: Differential includes muscle spasm as patient does have some tenderness and muscle spasm on examination today versus tendinitis, however she denies any repetitive motions to suggest this.  No indication for imaging at this time given that this is chronic and not bony in nature  -Recommended conservative treatment with heat and over-the-counter Tylenol.

## 2019-11-25 NOTE — Assessment & Plan Note (Signed)
Patient states she has had significant allergies that are predominantly environmental for many years now.  Since moving to [place] a little over a year ago, her allergies have been frustrating her.  She is using Flonase and cetirizine daily with some alleviation of symptoms.  Assessment/plan: -Continue Flonase daily -Continue cetirizine daily -Singulair added today for her combination of asthma and allergies

## 2019-11-25 NOTE — Assessment & Plan Note (Signed)
Theresa Carter states that her asthma seems to have improved some since her last visit.  At this time, she is only requiring albuterol at night prior to going to sleep.  She notes intermittent wheezing or shortness of breath throughout the day but does not feel she needs to use albuterol at this times.  Overall she feels like her asthma is usually triggered by her allergies.  At her last visit we started patient on Flovent inhaler for maintenance.  Theresa Carter states she was unable to tolerate as it caused her to have a choking sensation.  She requests to be restarted on Singulair as this is worked well for her in the past.  Assessment/plan: Given that asthma has improved and she is no longer requiring albuterol several times per day, will hold off on trying other maintenance inhalers.  Due to the her combination of asthma and allergies, will go ahead and start Singulair.  -Albuterol as needed -Start Singulair 10 mg daily

## 2019-11-25 NOTE — Therapy (Signed)
[place], [place], [address] Phone: [phone]   Fax:  [phone]  Physical Therapy Evaluation  Patient Details  Name: Theresa Carter MRN: [id number] Date of Birth: [birth year] Referring Provider (PT): Sid Falcon, MD   Encounter Date: 11/25/2019   PT End of Session - 11/25/19 1152    Visit Number 1    Number of Visits 13    Date for PT Re-Evaluation 01/09/20    PT Start Time 9528    PT Stop Time 1225    PT Time Calculation (min) 40 min    Activity Tolerance Patient tolerated treatment well    Behavior During Therapy [facility] for tasks assessed/performed           Past Medical History:  Diagnosis Date  . Asthma   . Chest pain 07/29/2018  . Diabetes mellitus without complication ([place])   . Ectopic pregnancy   . Hypertension     Past Surgical History:  Procedure Laterality Date  . CHOLECYSTECTOMY    . DILATION AND CURETTAGE OF UTERUS    . ENDOMETRIAL ABLATION    . HIP ARTHROSCOPY     x2  . SALPINGECTOMY    . TRIGGER FINGER RELEASE    . TUBAL LIGATION    . WRIST ARTHROSCOPY      There were no vitals filed for this visit.    Subjective Assessment - 11/25/19 1152    Subjective Good and bad days but more bad now. Hip flexion in seated is painful. Insidious onset. I think it needs to be strengthened. Pain into thigh and lateral hip, denies groin pain. REcently did 2 drives to [place].    How long can you walk comfortably? some days I can walk and some I limp a lot    Patient Stated Goals sleep, sit    Currently in Pain? Yes    Pain Score 5     Pain Location Hip    Pain Orientation Left;Lateral    Pain Descriptors / Indicators Aching    Aggravating Factors  hip flexion    Pain Relieving Factors extend leg              [facility] PT Assessment - 11/25/19 0001      Assessment   Medical Diagnosis chronic hip pain    Referring Provider (PT) Sid Falcon, MD    Onset Date/Surgical Date --    about 2-3 mo ago   Hand Dominance Right    Prior Therapy no      Precautions   Precautions None      Restrictions   Weight Bearing Restrictions No      Balance Screen   Has the patient fallen in the past 6 months Yes    How many times? 1   missed the last step 2 weeks ago   Has the patient had a decrease in activity level because of a fear of falling?  Yes    Is the patient reluctant to leave their home because of a fear of falling?  No      Home Environment   Living Environment Private residence    Additional Comments stairs at home      Prior Function   Level of Independence Independent      Cognition   Overall Cognitive Status Within Functional Limits for tasks assessed      Observation/Other Assessments   Focus on Therapeutic Outcomes (FOTO)  64% limited  Sensation   Additional Comments wFL      Posture/Postural Control   Posture Comments sits rotated to extend hip, Lt ASIS elevation in standing      ROM / Strength   AROM / PROM / Strength PROM;Strength      PROM   Overall PROM Comments WFL but uncomfortable    PROM Assessment Site Hip    Right/Left Hip Left      Strength   Overall Strength Comments grossly able to lift against gravity but is painful- not appropriate to test MMT    Strength Assessment Site Hip    Right/Left Hip Left                      Objective measurements completed on examination: See above findings.       [facility] Adult PT Treatment/Exercise - 11/25/19 0001      Exercises   Exercises Knee/Hip      Knee/Hip Exercises: Stretches   Other Knee/Hip Stretches knee to shoulder 2 min hold    Other Knee/Hip Stretches foam roll piriformis release 2 min      Manual Therapy   Manual Therapy Joint mobilization;Soft tissue mobilization    Joint Mobilization Lt illiac post rotation spring 2 min    Soft tissue mobilization Lt glut med/min STM                  PT Education - 11/25/19 1323    Education Details  anatomy of condition, POC, HEP, FOTO, exercise form/rationale    Person(s) Educated Patient    Methods Explanation;Demonstration;Tactile cues;Verbal cues    Comprehension Verbalized understanding;Returned demonstration;Verbal cues required;Tactile cues required;Need further instruction            PT Short Term Goals - 11/25/19 1321      PT SHORT TERM GOAL #1   Title pt will use stretches and HEP to maintain level pelvis    Baseline Lt elevated at eval    Time 3    Period Weeks    Status New    Target Date 12/19/19             PT Long Term Goals - 11/25/19 1320      PT LONG TERM GOAL #1   Title LE strength to 5/5    Baseline not tested at eval due to pain    Time 6    Period Weeks    Status New    Target Date 01/09/20      PT LONG TERM GOAL #2   Title Able to tolerated seated position for at least 30 min    Baseline sits with hip extended to compensate for pain    Time 6    Period Weeks    Status New    Target Date 01/09/20      PT LONG TERM GOAL #3   Title pt will be able to ambulate comfortably for at least 1 week without incr hip pain    Baseline more bad days recently    Time 6    Period Weeks    Status New    Target Date 01/09/20                  Plan - 11/25/19 1231    Clinical Impression Statement Pt presents to PT with complains of chronic Lt hip pain that has had a recent flare of more "bad days"   AROM is verypainful and PROM is [facility] but  moderately painful. Notable anterior rotation of Lt innominate corrected with manual spring treatment & stretches. Significant tightness in abductors and external rotators decreased somewith STM today. Pt will focus on the two stretches and her posture to improve pelvic alignment and will benefit from skilled PT to improve lumbopelvic stability to meet functional goals.    Personal Factors and Comorbidities Time since onset of injury/illness/exacerbation;Comorbidity 1    Comorbidities h/o 2 hip arthroscopies     Examination-Activity Limitations Bed Mobility;Bend;Sit;Sleep;Squat;Carry;Stairs;Stand    Examination-Participation Restrictions Meal Prep;Cleaning;Driving;Shop    Stability/Clinical Decision Making Evolving/Moderate complexity    Clinical Decision Making Moderate    Rehab Potential Good    PT Frequency 2x / week    PT Duration 6 weeks    PT Treatment/Interventions ADLs/Self Care Home Management;Cryotherapy;Electrical Stimulation;Gait training;Ultrasound;Traction;Moist Heat;Iontophoresis 4mg /ml Dexamethasone;Stair training;Functional mobility training;Therapeutic activities;Therapeutic exercise;Neuromuscular re-education;Manual techniques;Patient/family education;Passive range of motion;Dry needling;Taping;Joint Manipulations;Spinal Manipulations    PT Next Visit Plan recheck alignment & manual PRN, lumbopelvic strengthening    PT Home Exercise Plan Lt knee to shoulder, supine on foam roll    Consulted and Agree with Plan of Care Patient           Patient will benefit from skilled therapeutic intervention in order to improve the following deficits and impairments:  Abnormal gait, Difficulty walking, Increased muscle spasms, Decreased activity tolerance, Pain, Improper body mechanics, Decreased strength, Postural dysfunction  Visit Diagnosis: Pain in left hip - Plan: PT plan of care cert/re-cert  Difficulty in walking, not elsewhere classified - Plan: PT plan of care cert/re-cert  Muscle weakness (generalized) - Plan: PT plan of care cert/re-cert     Problem List Patient Active Problem List   Diagnosis Date Noted  . Left elbow pain 11/25/2019  . Allergies 08/11/2019  . Hidradenitis suppurativa 08/11/2019  . Essential hypertension 07/29/2018  . Asthma 07/29/2018  . Diabetes mellitus type II, non insulin dependent ([place]) 07/29/2018  . Chronic pain 07/29/2018    Laymon Stockert C. Jersie Beel PT, DPT 11/25/19 1:26 PM   [facility] [address] [place], [place], [address] Phone: [phone]   Fax:  [phone]  Name: Theresa Carter MRN: [id number] Date of Birth: Dec 31, 1970

## 2019-11-25 NOTE — Assessment & Plan Note (Addendum)
At last office visit, Theresa Carter had active HS boils that were draining, with the right axilla and groin being the most affected.  At the time patient was started on clindamycin and rifampin for 3 months.  Today, Theresa Carter states that her boils responded very well to the clindamycin that she has been taking daily since her last visit.  She states she has a little less than 1 month left.  She tried to take the rifampin for a couple days but had severe acid reflux and abdominal pain with it, and has since discontinued it.  She states that most of her boils have now healed and are no longer draining with the exception of one in her groin region, with which the drainage is minimal.  Theresa Carter states she was able to make an appointment with the [facility] at [facility] for October.  Assessment/plan: Patient is having a good response to the clindamycin, even without the rifampin on board.  Plan to continue at this time to finish out the 37-month prescription.  -Clindamycin 300 mg BID -Discontinue Rifampin

## 2019-11-25 NOTE — Assessment & Plan Note (Signed)
Ms. Soloway denies any chest pain or palpitations since discontinuing Verapamil at last office visit. She denies any dizziness, headaches or vision changes.  Assessment/Plan:  BP elevated above goal, however consistent with past measurements at the clinic. Will increase Ramipril at this time.   - Ramipril 20 mg daily

## 2019-11-25 NOTE — Assessment & Plan Note (Signed)
Lab Results  Component Value Date   HGBA1C 6.7 (A) 09/16/2019   Theresa Carter states that she continues to do well with her current medication regimen which includes Victoza, Invokana, and Metformin.  She is not checking her sugars regularly at home at this time  Assessment/Plan:  Well-controlled per prior A1c's.  She is not yet due for repeat today.  No medication changes made today  -Continue current dose of Victoza, Invokana, Metformin -Will need diabetic eye exam, will pursue at next visit -Foot exam next visit -Urine microalbumin at next visit

## 2019-11-26 ENCOUNTER — Encounter: Payer: Self-pay | Admitting: Internal Medicine

## 2019-11-26 MED ORDER — ALBUTEROL SULFATE HFA 108 (90 BASE) MCG/ACT IN AERS
2.0000 | INHALATION_SPRAY | Freq: Four times a day (QID) | RESPIRATORY_TRACT | 3 refills | Status: DC | PRN
Start: 2019-11-26 — End: 2020-11-10

## 2019-11-26 NOTE — Progress Notes (Signed)
Internal Medicine Clinic Attending  Case discussed with Dr. Basaraba  At the time of the visit.  We reviewed the resident's history and exam and pertinent patient test results.  I agree with the assessment, diagnosis, and plan of care documented in the resident's note.  

## 2019-12-09 ENCOUNTER — Other Ambulatory Visit: Payer: Self-pay

## 2019-12-09 ENCOUNTER — Ambulatory Visit
Admission: RE | Admit: 2019-12-09 | Discharge: 2019-12-09 | Disposition: A | Discharge status: Home / Self Care | Payer: 59 | Source: Ambulatory Visit | Attending: Internal Medicine | Admitting: Internal Medicine

## 2019-12-09 DIAGNOSIS — Z1231 Encounter for screening mammogram for malignant neoplasm of breast: Secondary | ICD-10-CM

## 2019-12-10 ENCOUNTER — Encounter: Payer: Self-pay | Admitting: Internal Medicine

## 2019-12-10 MED ORDER — PEN NEEDLES 33G X 4 MM MISC: 3 refills | Status: DC

## 2019-12-12 ENCOUNTER — Ambulatory Visit: Payer: 59 | Admitting: Physical Therapy

## 2019-12-12 ENCOUNTER — Other Ambulatory Visit: Payer: Self-pay

## 2019-12-12 ENCOUNTER — Encounter: Payer: Self-pay | Admitting: Physical Therapy

## 2019-12-12 DIAGNOSIS — M25552 Pain in left hip: Secondary | ICD-10-CM

## 2019-12-12 DIAGNOSIS — R262 Difficulty in walking, not elsewhere classified: Secondary | ICD-10-CM

## 2019-12-12 DIAGNOSIS — M6281 Muscle weakness (generalized): Secondary | ICD-10-CM

## 2019-12-13 ENCOUNTER — Encounter: Payer: Self-pay | Admitting: Physical Therapy

## 2019-12-13 NOTE — Therapy (Signed)
[place], [place], [address] Phone: [phone]   Fax:  [phone]  Physical Therapy Treatment  Patient Details  Name: Theresa Carter MRN: [id number] Date of Birth: 1970-04-27 Referring Provider (PT): Sid Falcon, MD   Encounter Date: 12/12/2019   PT End of Session - 12/13/19 2115    Number of Visits 13    Date for PT Re-Evaluation 01/09/20    PT Start Time 1127   pt arrived late   PT Stop Time 1200    PT Time Calculation (min) 33 min    Activity Tolerance Patient tolerated treatment well    Behavior During Therapy [facility] for tasks assessed/performed           Past Medical History:  Diagnosis Date   Asthma    Chest pain 07/29/2018   Diabetes mellitus without complication ([place])    Ectopic pregnancy    Hypertension     Past Surgical History:  Procedure Laterality Date   CHOLECYSTECTOMY     DILATION AND CURETTAGE OF UTERUS     ENDOMETRIAL ABLATION     HIP ARTHROSCOPY     x2   SALPINGECTOMY     TRIGGER FINGER RELEASE     TUBAL LIGATION     WRIST ARTHROSCOPY      There were no vitals filed for this visit.   Subjective Assessment - 12/13/19 2115    Subjective It's ok today. Did HEP and it was helpful..    Patient Stated Goals sleep, sit    Currently in Pain? Yes    Pain Score 2     Pain Location Hip    Pain Orientation Left                                     PT Education - 12/13/19 2114    Education Details anatomy of condition, ionto, sacral torsion & peripheralizing pain    Person(s) Educated Patient    Methods Explanation    Comprehension Verbalized understanding;Need further instruction            PT Short Term Goals - 11/25/19 1321      PT SHORT TERM GOAL #1   Title pt will use stretches and HEP to maintain level pelvis    Baseline Lt elevated at eval    Time 3    Period Weeks    Status New    Target Date 12/19/19              PT Long Term Goals - 11/25/19 1320      PT LONG TERM GOAL #1   Title LE strength to 5/5    Baseline not tested at eval due to pain    Time 6    Period Weeks    Status New    Target Date 01/09/20      PT LONG TERM GOAL #2   Title Able to tolerated seated position for at least 30 min    Baseline sits with hip extended to compensate for pain    Time 6    Period Weeks    Status New    Target Date 01/09/20      PT LONG TERM GOAL #3   Title pt will be able to ambulate comfortably for at least 1 week without incr hip pain    Baseline more bad days recently    Time  6    Period Weeks    Status New    Target Date 01/09/20                 Plan - 12/13/19 2116    Clinical Impression Statement LBP corrected with self- correction which centralized pain to Lt hip bursa. into patch placed following manual therpay and will wear for 6 hrs. pt reported improvement in pain levels following.    PT Treatment/Interventions ADLs/Self Care Home Management;Cryotherapy;Electrical Stimulation;Gait training;Ultrasound;Traction;Moist Heat;Iontophoresis 4mg /ml Dexamethasone;Stair training;Functional mobility training;Therapeutic activities;Therapeutic exercise;Neuromuscular re-education;Manual techniques;Patient/family education;Passive range of motion;Dry needling;Taping;Joint Manipulations;Spinal Manipulations    PT Next Visit Plan recheck alignment & manual PRN, lumbopelvic strengthening    PT Home Exercise Plan Lt knee to shoulder, supine on foam roll, self correction sacral torsion    Consulted and Agree with Plan of Care Patient           Patient will benefit from skilled therapeutic intervention in order to improve the following deficits and impairments:  Abnormal gait, Difficulty walking, Increased muscle spasms, Decreased activity tolerance, Pain, Improper body mechanics, Decreased strength, Postural dysfunction  Visit Diagnosis: Pain in left hip  Difficulty in walking, not  elsewhere classified  Muscle weakness (generalized)     Problem List Patient Active Problem List   Diagnosis Date Noted   Left elbow pain 11/25/2019   Allergies 08/11/2019   Hidradenitis suppurativa 08/11/2019   Essential hypertension 07/29/2018   Asthma 07/29/2018   Diabetes mellitus type II, non insulin dependent ([place]) 07/29/2018   Chronic pain 07/29/2018    Murel Shenberger C. Evalynne Locurto PT, DPT 12/13/19 9:17 PM   [facility] [address] [place], [place], [address] Phone: [phone]   Fax:  [phone]  Name: Theresa Carter MRN: [id number] Date of Birth: 12-05-1970

## 2019-12-15 ENCOUNTER — Other Ambulatory Visit: Payer: Self-pay

## 2019-12-15 ENCOUNTER — Encounter: Payer: Self-pay | Admitting: Physical Therapy

## 2019-12-15 ENCOUNTER — Ambulatory Visit: Payer: 59 | Admitting: Physical Therapy

## 2019-12-15 DIAGNOSIS — M25552 Pain in left hip: Secondary | ICD-10-CM

## 2019-12-15 DIAGNOSIS — M6281 Muscle weakness (generalized): Secondary | ICD-10-CM

## 2019-12-15 DIAGNOSIS — R262 Difficulty in walking, not elsewhere classified: Secondary | ICD-10-CM

## 2019-12-15 NOTE — Therapy (Signed)
Theresa Carter, [place], [address] Phone: [phone]   Fax:  [phone]  Physical Therapy Treatment  Patient Details  Name: Theresa Carter MRN: [id number] Date of Birth: 12-10-1970 Referring Provider (PT): Sid Falcon, MD   Encounter Date: 12/15/2019   PT End of Session - 12/15/19 1104    Visit Number 3    Number of Visits 13    Date for PT Re-Evaluation 01/09/20    PT Start Time 1101    PT Stop Time 1139    PT Time Calculation (min) 38 min    Activity Tolerance Patient tolerated treatment well    Behavior During Therapy [facility] for tasks assessed/performed           Past Medical History:  Diagnosis Date   Asthma    Chest pain 07/29/2018   Diabetes mellitus without complication ([place])    Ectopic pregnancy    Hypertension     Past Surgical History:  Procedure Laterality Date   CHOLECYSTECTOMY     DILATION AND CURETTAGE OF UTERUS     ENDOMETRIAL ABLATION     HIP ARTHROSCOPY     x2   SALPINGECTOMY     TRIGGER FINGER RELEASE     TUBAL LIGATION     WRIST ARTHROSCOPY      There were no vitals filed for this visit.   Subjective Assessment - 12/15/19 1103    Subjective It;s just right here (grabbing lateral left hip).    Currently in Pain? Yes    Pain Score 4     Pain Location Hip    Pain Orientation Left;Lateral    Pain Descriptors / Indicators Aching                             [facility] Adult PT Treatment/Exercise - 12/15/19 0001      Knee/Hip Exercises: Stretches   Piriformis Stretch Left;2 reps;30 seconds      Knee/Hip Exercises: Aerobic   Nustep 5 min L4 LE only      Knee/Hip Exercises: Standing   Functional Squat 10 reps    Other Standing Knee Exercises sacral torsion self correction at wall with foam      Iontophoresis   Type of Iontophoresis Dexamethasone    Location Lt femoral bursitis    Dose 1cc    Time 6 hr wear      Manual Therapy    Manual therapy comments pt performed trigger point release to piriformis    Soft tissue mobilization roller & trigger point release to Lt VL & hamstrings, roller to piriformis                    PT Short Term Goals - 11/25/19 1321      PT SHORT TERM GOAL #1   Title pt will use stretches and HEP to maintain level pelvis    Baseline Lt elevated at eval    Time 3    Period Weeks    Status New    Target Date 12/19/19             PT Long Term Goals - 11/25/19 1320      PT LONG TERM GOAL #1   Title LE strength to 5/5    Baseline not tested at eval due to pain    Time 6    Period Weeks    Status New    Target  Date 01/09/20      PT LONG TERM GOAL #2   Title Able to tolerated seated position for at least 30 min    Baseline sits with hip extended to compensate for pain    Time 6    Period Weeks    Status New    Target Date 01/09/20      PT LONG TERM GOAL #3   Title pt will be able to ambulate comfortably for at least 1 week without incr hip pain    Baseline more bad days recently    Time 6    Period Weeks    Status New    Target Date 01/09/20                 Plan - 12/15/19 1206    Clinical Impression Statement Significant tightness through thigh and hip creating bursa irritation.Felt much better after manual therapy today and ionto was placed again. Now that she is no longer babysitting her nephews, she is going to focus on her exercises and will go to gym to ride bike/nustep until her apt on Thursday.    PT Treatment/Interventions ADLs/Self Care Home Management;Cryotherapy;Electrical Stimulation;Gait training;Ultrasound;Traction;Moist Heat;Iontophoresis 4mg /ml Dexamethasone;Stair training;Functional mobility training;Therapeutic activities;Therapeutic exercise;Neuromuscular re-education;Manual techniques;Patient/family education;Passive range of motion;Dry needling;Taping;Joint Manipulations;Spinal Manipulations    PT Next Visit Plan recheck alignment &  manual PRN, lumbopelvic strengthening    PT Home Exercise Plan Lt knee to shoulder, supine on foam roll, self correction sacral torsion    Consulted and Agree with Plan of Care Patient           Patient will benefit from skilled therapeutic intervention in order to improve the following deficits and impairments:  Abnormal gait, Difficulty walking, Increased muscle spasms, Decreased activity tolerance, Pain, Improper body mechanics, Decreased strength, Postural dysfunction  Visit Diagnosis: Pain in left hip  Difficulty in walking, not elsewhere classified  Muscle weakness (generalized)     Problem List Patient Active Problem List   Diagnosis Date Noted   Left elbow pain 11/25/2019   Allergies 08/11/2019   Hidradenitis suppurativa 08/11/2019   Essential hypertension 07/29/2018   Asthma 07/29/2018   Diabetes mellitus type II, non insulin dependent ([place]) 07/29/2018   Chronic pain 07/29/2018    Haelee Bolen C. Jisele Price PT, DPT 12/15/19 12:14 PM   [facility] [address] [place], [place], [address] Phone: [phone]   Fax:  [phone]  Name: Theresa Carter MRN: [id number] Date of Birth: 03-Jul-1970

## 2019-12-16 ENCOUNTER — Encounter: Payer: Self-pay | Admitting: Internal Medicine

## 2019-12-17 ENCOUNTER — Encounter: Payer: Self-pay | Admitting: Internal Medicine

## 2019-12-17 ENCOUNTER — Ambulatory Visit: Payer: 59 | Admitting: Physical Therapy

## 2019-12-21 ENCOUNTER — Other Ambulatory Visit: Payer: Self-pay | Admitting: Internal Medicine

## 2019-12-21 MED ORDER — CLINDAMYCIN HCL 300 MG PO CAPS: 300.0000 mg | ORAL_CAPSULE | Freq: Two times a day (BID) | ORAL | 0 refills | Status: DC

## 2019-12-23 ENCOUNTER — Other Ambulatory Visit: Payer: Self-pay

## 2019-12-23 ENCOUNTER — Encounter: Payer: Self-pay | Admitting: Physical Therapy

## 2019-12-23 ENCOUNTER — Ambulatory Visit: Payer: 59 | Attending: Internal Medicine | Admitting: Physical Therapy

## 2019-12-23 DIAGNOSIS — M6281 Muscle weakness (generalized): Secondary | ICD-10-CM | POA: Diagnosis present

## 2019-12-23 DIAGNOSIS — M25552 Pain in left hip: Secondary | ICD-10-CM

## 2019-12-23 DIAGNOSIS — R262 Difficulty in walking, not elsewhere classified: Secondary | ICD-10-CM | POA: Insufficient documentation

## 2019-12-23 NOTE — Therapy (Signed)
[name], [place], [address] Phone: [phone]   Fax:  [phone]  Physical Therapy Treatment  Patient Details  Name: Theresa Carter MRN: [id number] Date of Birth: 08-01-1970 Referring Provider (PT): Sid Falcon, MD   Encounter Date: 12/23/2019   PT End of Session - 12/23/19 1552    Visit Number 4    Number of Visits 13    Date for PT Re-Evaluation 01/09/20    PT Start Time 8338    PT Stop Time 1637    PT Time Calculation (min) 49 min    Activity Tolerance Patient limited by pain    Behavior During Therapy Flat affect           Past Medical History:  Diagnosis Date  . Asthma   . Chest pain 07/29/2018  . Diabetes mellitus without complication ([place])   . Ectopic pregnancy   . Hypertension     Past Surgical History:  Procedure Laterality Date  . CHOLECYSTECTOMY    . DILATION AND CURETTAGE OF UTERUS    . ENDOMETRIAL ABLATION    . HIP ARTHROSCOPY     x2  . SALPINGECTOMY    . TRIGGER FINGER RELEASE    . TUBAL LIGATION    . WRIST ARTHROSCOPY      There were no vitals filed for this visit.   Subjective Assessment - 12/23/19 1552    Subjective It is really achey.    Patient Stated Goals sleep, sit              [facility] PT Assessment - 12/23/19 0001      Special Tests   Other special tests no response to extension bias                         [facility] Adult PT Treatment/Exercise - 12/23/19 0001      Knee/Hip Exercises: Stretches   Other Knee/Hip Stretches child pose x2      Modalities   Modalities Moist Heat;Electrical Stimulation      Moist Heat Therapy   Number Minutes Moist Heat 15 Minutes   with ESTIM   Moist Heat Location Hip      Electrical Stimulation   Electrical Stimulation Location Lt hip    Electrical Stimulation Action IFC    Electrical Stimulation Parameters to tolerance with heat    Electrical Stimulation Goals Pain      Manual Therapy    Joint Mobilization Lt sacral spring upper & mid 2 min each    Soft tissue mobilization Lt hip region TPR, roller to quads & HS, ITB                    PT Short Term Goals - 11/25/19 1321      PT SHORT TERM GOAL #1   Title pt will use stretches and HEP to maintain level pelvis    Baseline Lt elevated at eval    Time 3    Period Weeks    Status New    Target Date 12/19/19             PT Long Term Goals - 11/25/19 1320      PT LONG TERM GOAL #1   Title LE strength to 5/5    Baseline not tested at eval due to pain    Time 6    Period Weeks    Status New    Target Date  01/09/20      PT LONG TERM GOAL #2   Title Able to tolerated seated position for at least 30 min    Baseline sits with hip extended to compensate for pain    Time 6    Period Weeks    Status New    Target Date 01/09/20      PT LONG TERM GOAL #3   Title pt will be able to ambulate comfortably for at least 1 week without incr hip pain    Baseline more bad days recently    Time 6    Period Weeks    Status New    Target Date 01/09/20                 Plan - 12/23/19 1623    Clinical Impression Statement Pt reports being under stress and crying a lot today due to events outside of PT. She had significant TTP in musculature around hip consistent with trigger points which were decreased with manual therapy. Has f/u with MD regarding hip surgical history in November. Cont to be tender at Gr trochanter but ionto patches were not helpful. She is rolling and stretching at home and has less pain into quads and hamstrings. She did not respond to extension bias positively or negatively.    PT Treatment/Interventions ADLs/Self Care Home Management;Cryotherapy;Electrical Stimulation;Gait training;Ultrasound;Traction;Moist Heat;Iontophoresis 4mg /ml Dexamethasone;Stair training;Functional mobility training;Therapeutic activities;Therapeutic exercise;Neuromuscular re-education;Manual techniques;Patient/family  education;Passive range of motion;Dry needling;Taping;Joint Manipulations;Spinal Manipulations    PT Next Visit Plan gross stretching & isometric activation    PT Home Exercise Plan Lt knee to shoulder, supine on foam roll, self correction sacral torsion    Consulted and Agree with Plan of Care Patient           Patient will benefit from skilled therapeutic intervention in order to improve the following deficits and impairments:  Abnormal gait, Difficulty walking, Increased muscle spasms, Decreased activity tolerance, Pain, Improper body mechanics, Decreased strength, Postural dysfunction  Visit Diagnosis: Pain in left hip  Difficulty in walking, not elsewhere classified  Muscle weakness (generalized)     Problem List Patient Active Problem List   Diagnosis Date Noted  . Left elbow pain 11/25/2019  . Allergies 08/11/2019  . Hidradenitis suppurativa 08/11/2019  . Essential hypertension 07/29/2018  . Asthma 07/29/2018  . Diabetes mellitus type II, non insulin dependent ([place]) 07/29/2018  . Chronic pain 07/29/2018    Theresa Carter PT, DPT 12/23/19 4:27 PM   [facility] [address] [place], [place], [address] Phone: [phone]   Fax:  [phone]  Name: Theresa Carter MRN: [id number] Date of Birth: April 27, 1970

## 2019-12-25 ENCOUNTER — Ambulatory Visit: Payer: 59 | Admitting: Physical Therapy

## 2019-12-29 ENCOUNTER — Other Ambulatory Visit: Payer: Self-pay

## 2019-12-29 ENCOUNTER — Encounter: Payer: Self-pay | Admitting: Physical Therapy

## 2019-12-29 ENCOUNTER — Ambulatory Visit: Payer: 59 | Admitting: Physical Therapy

## 2019-12-29 DIAGNOSIS — R262 Difficulty in walking, not elsewhere classified: Secondary | ICD-10-CM

## 2019-12-29 DIAGNOSIS — M25552 Pain in left hip: Secondary | ICD-10-CM

## 2019-12-29 NOTE — Therapy (Signed)
[place], [place], [address] Phone: [phone]   Fax:  [phone]  Physical Therapy Treatment  Patient Details  Name: Theresa Carter MRN: [id number] Date of Birth: [birth year] Referring Provider (PT): Sid Falcon, MD   Encounter Date: 12/29/2019   PT End of Session - 12/29/19 1525    Visit Number 5    Number of Visits 13    Date for PT Re-Evaluation 01/09/20    PT Start Time 1508    PT Stop Time 1521    PT Time Calculation (min) 13 min    Activity Tolerance Other (comment)           Past Medical History:  Diagnosis Date  . Asthma   . Chest pain 07/29/2018  . Diabetes mellitus without complication ([place])   . Ectopic pregnancy   . Hypertension     Past Surgical History:  Procedure Laterality Date  . CHOLECYSTECTOMY    . DILATION AND CURETTAGE OF UTERUS    . ENDOMETRIAL ABLATION    . HIP ARTHROSCOPY     x2  . SALPINGECTOMY    . TRIGGER FINGER RELEASE    . TUBAL LIGATION    . WRIST ARTHROSCOPY      There were no vitals filed for this visit.   Subjective Assessment - 12/29/19 1522    Subjective see plan                                       PT Short Term Goals - 11/25/19 1321      PT SHORT TERM GOAL #1   Title pt will use stretches and HEP to maintain level pelvis    Baseline Lt elevated at eval    Time 3    Period Weeks    Status New    Target Date 12/19/19             PT Long Term Goals - 11/25/19 1320      PT LONG TERM GOAL #1   Title LE strength to 5/5    Baseline not tested at eval due to pain    Time 6    Period Weeks    Status New    Target Date 01/09/20      PT LONG TERM GOAL #2   Title Able to tolerated seated position for at least 30 min    Baseline sits with hip extended to compensate for pain    Time 6    Period Weeks    Status New    Target Date 01/09/20      PT LONG TERM GOAL #3   Title pt will be able to  ambulate comfortably for at least 1 week without incr hip pain    Baseline more bad days recently    Time 6    Period Weeks    Status New    Target Date 01/09/20                 Plan - 12/29/19 1524    Clinical Impression Statement Pt arrived today very upset due to finding out about her daughter's past. They are signed up for counseling coming up. I provided info about [facility] and asked her to bring her daughter to her appointments so she is not leaving her at home. Sent her home to be with her daughter  and encouraged her to work on her mental health so that we can be effective in treating physical health.    PT Treatment/Interventions ADLs/Self Care Home Management;Cryotherapy;Electrical Stimulation;Gait training;Ultrasound;Traction;Moist Heat;Iontophoresis 4mg /ml Dexamethasone;Stair training;Functional mobility training;Therapeutic activities;Therapeutic exercise;Neuromuscular re-education;Manual techniques;Patient/family education;Passive range of motion;Dry needling;Taping;Joint Manipulations;Spinal Manipulations    PT Next Visit Plan dry needling    PT Home Exercise Plan Lt knee to shoulder, supine on foam roll, self correction sacral torsion    Consulted and Agree with Plan of Care Patient           Patient will benefit from skilled therapeutic intervention in order to improve the following deficits and impairments:  Abnormal gait, Difficulty walking, Increased muscle spasms, Decreased activity tolerance, Pain, Improper body mechanics, Decreased strength, Postural dysfunction  Visit Diagnosis: Pain in left hip  Difficulty in walking, not elsewhere classified     Problem List Patient Active Problem List   Diagnosis Date Noted  . Left elbow pain 11/25/2019  . Allergies 08/11/2019  . Hidradenitis suppurativa 08/11/2019  . Essential hypertension 07/29/2018  . Asthma 07/29/2018  . Diabetes mellitus type II, non insulin dependent ([place]) 07/29/2018  .  Chronic pain 07/29/2018    Keyondra Lagrand C. Oviya Ammar PT, DPT 12/29/19 3:26 PM   [facility] [address] [place], [place], [address] Phone: [phone]   Fax:  [phone]  Name: Theresa Carter MRN: [id number] Date of Birth: October 28, 1970

## 2019-12-31 ENCOUNTER — Ambulatory Visit: Payer: 59 | Admitting: Physical Therapy

## 2020-01-01 ENCOUNTER — Telehealth: Payer: Self-pay | Admitting: *Deleted

## 2020-01-01 ENCOUNTER — Other Ambulatory Visit: Payer: Self-pay | Admitting: Student

## 2020-01-01 ENCOUNTER — Encounter: Payer: Self-pay | Admitting: Internal Medicine

## 2020-01-01 DIAGNOSIS — L732 Hidradenitis suppurativa: Secondary | ICD-10-CM

## 2020-01-01 MED ORDER — CLINDAMYCIN HCL 300 MG PO CAPS: 300.0000 mg | ORAL_CAPSULE | Freq: Two times a day (BID) | ORAL | 0 refills | Status: AC

## 2020-01-01 NOTE — Telephone Encounter (Signed)
Call to [facility] concerning PA for Victoza.  Had bee approved previously for  2 pens per month.  Overdride was needed.  Information was given phone call.  Pharmacy will need to contact the plan for instructions on how to override the claim for quantity.  RTC to [facility] spoke to Pharmacist who will call [facility] for the instructions.  Call to patient that approval has been done and should expect a call or can call [facility] for override information.   Sander Nephew, RN 01/01/2020 4:32 PM.

## 2020-01-01 NOTE — Telephone Encounter (Signed)
I called and spoke to the patient.  Patient states that the pharmacy did not receive the prescription for Clindamycin from Dr. Charleen Kirks.  I resent the prescription to [facility] [address].  Regarding her Victoza, we will contact her pharmacy to work on prior authorization.  Gaylan Gerold, DO

## 2020-01-01 NOTE — Progress Notes (Signed)
Patient states that the pharmacy did not receive the prescription. Resent prescription to [facility] [address].  Gaylan Gerold, DO

## 2020-01-05 ENCOUNTER — Ambulatory Visit: Payer: 59 | Admitting: Physical Therapy

## 2020-01-05 ENCOUNTER — Other Ambulatory Visit: Payer: Self-pay

## 2020-01-05 ENCOUNTER — Encounter: Payer: Self-pay | Admitting: Physical Therapy

## 2020-01-05 DIAGNOSIS — M6281 Muscle weakness (generalized): Secondary | ICD-10-CM

## 2020-01-05 DIAGNOSIS — M25552 Pain in left hip: Secondary | ICD-10-CM | POA: Diagnosis not present

## 2020-01-05 DIAGNOSIS — R262 Difficulty in walking, not elsewhere classified: Secondary | ICD-10-CM

## 2020-01-05 NOTE — Therapy (Signed)
[place], [place], [address] Phone: [phone]   Fax:  [phone]  Physical Therapy Treatment  Patient Details  Name: Theresa Carter MRN: [id number] Date of Birth: 1971-01-01 Referring Provider (PT): Sid Falcon, MD   Encounter Date: 01/05/2020   PT End of Session - 01/05/20 1507    Visit Number 6    Number of Visits 13    Date for PT Re-Evaluation 01/09/20    PT Start Time 1504   pt arrived late   PT Stop Time 1544    PT Time Calculation (min) 40 min    Activity Tolerance Patient tolerated treatment well    Behavior During Therapy [facility] for tasks assessed/performed           Past Medical History:  Diagnosis Date  . Asthma   . Chest pain 07/29/2018  . Diabetes mellitus without complication ([place])   . Ectopic pregnancy   . Hypertension     Past Surgical History:  Procedure Laterality Date  . CHOLECYSTECTOMY    . DILATION AND CURETTAGE OF UTERUS    . ENDOMETRIAL ABLATION    . HIP ARTHROSCOPY     x2  . SALPINGECTOMY    . TRIGGER FINGER RELEASE    . TUBAL LIGATION    . WRIST ARTHROSCOPY      There were no vitals filed for this visit.   Subjective Assessment - 01/05/20 1506    Subjective It is sore today, my whole (Left) leg is sore.    Currently in Pain? Yes    Pain Score 6     Pain Location Hip    Pain Orientation Left;Lateral    Pain Descriptors / Indicators Sore    Aggravating Factors  sitting with hip flexed    Pain Relieving Factors extend leg              [facility] PT Assessment - 01/05/20 0001      Strength   Left Hip Flexion 4+/5                         [facility] Adult PT Treatment/Exercise - 01/05/20 0001      Knee/Hip Exercises: Stretches   Active Hamstring Stretch Limitations end of session 2x5    Passive Hamstring Stretch Left;4 reps;30 seconds    Gastroc Stretch Both;2 reps;30 seconds      Knee/Hip Exercises: Aerobic   Nustep 5 min L7 LE only       Knee/Hip Exercises: Supine   Other Supine Knee/Hip Exercises Legs ext to ceiling with turnout 6x10s holds with ab set      Manual Therapy   Joint Mobilization Lt LE LAD    Soft tissue mobilization Lt HS & hip                    PT Short Term Goals - 11/25/19 1321      PT SHORT TERM GOAL #1   Title pt will use stretches and HEP to maintain level pelvis    Baseline Lt elevated at eval    Time 3    Period Weeks    Status New    Target Date 12/19/19             PT Long Term Goals - 11/25/19 1320      PT LONG TERM GOAL #1   Title LE strength to 5/5    Baseline not tested at eval  due to pain    Time 6    Period Weeks    Status New    Target Date 01/09/20      PT LONG TERM GOAL #2   Title Able to tolerated seated position for at least 30 min    Baseline sits with hip extended to compensate for pain    Time 6    Period Weeks    Status New    Target Date 01/09/20      PT LONG TERM GOAL #3   Title pt will be able to ambulate comfortably for at least 1 week without incr hip pain    Baseline more bad days recently    Time 6    Period Weeks    Status New    Target Date 01/09/20                 Plan - 01/05/20 1631    Clinical Impression Statement Significant tightness and discomfort especially in distal Hamstrings on Lt side today as well as piriformis. manual therapy was helpful to decrease tension and AHSS with abdominal hold added to HEP to stabilize lumbopelvic region.    PT Treatment/Interventions ADLs/Self Care Home Management;Cryotherapy;Electrical Stimulation;Gait training;Ultrasound;Traction;Moist Heat;Iontophoresis 4mg /ml Dexamethasone;Stair training;Functional mobility training;Therapeutic activities;Therapeutic exercise;Neuromuscular re-education;Manual techniques;Patient/family education;Passive range of motion;Dry needling;Taping;Joint Manipulations;Spinal Manipulations    PT Next Visit Plan progress core exercises, check hamstring     PT Home Exercise Plan Lt knee to shoulder, supine on foam roll, self correction sacral torsion, AHSS, isometric hold legs extended/ER    Consulted and Agree with Plan of Care Patient           Patient will benefit from skilled therapeutic intervention in order to improve the following deficits and impairments:  Abnormal gait, Difficulty walking, Increased muscle spasms, Decreased activity tolerance, Pain, Improper body mechanics, Decreased strength, Postural dysfunction  Visit Diagnosis: Pain in left hip  Difficulty in walking, not elsewhere classified  Muscle weakness (generalized)     Problem List Patient Active Problem List   Diagnosis Date Noted  . Left elbow pain 11/25/2019  . Allergies 08/11/2019  . Hidradenitis suppurativa 08/11/2019  . Essential hypertension 07/29/2018  . Asthma 07/29/2018  . Diabetes mellitus type II, non insulin dependent ([place]) 07/29/2018  . Chronic pain 07/29/2018    Theresa Carter PT, DPT 01/05/20 4:33 PM   [facility] [address] [place], [place], [address] Phone: [phone]   Fax:  [phone]  Name: Theresa Carter MRN: [id number] Date of Birth: 1970/08/22

## 2020-01-07 ENCOUNTER — Ambulatory Visit: Payer: 59 | Admitting: Physical Therapy

## 2020-01-20 ENCOUNTER — Ambulatory Visit: Payer: 59 | Admitting: Physical Therapy

## 2020-01-21 ENCOUNTER — Encounter: Payer: Self-pay | Admitting: Internal Medicine

## 2020-01-21 ENCOUNTER — Ambulatory Visit: Payer: 59 | Admitting: Physical Therapy

## 2020-01-21 ENCOUNTER — Encounter: Payer: Self-pay | Admitting: Physical Therapy

## 2020-01-21 ENCOUNTER — Other Ambulatory Visit: Payer: Self-pay

## 2020-01-21 DIAGNOSIS — M6281 Muscle weakness (generalized): Secondary | ICD-10-CM

## 2020-01-21 DIAGNOSIS — M25552 Pain in left hip: Secondary | ICD-10-CM | POA: Diagnosis not present

## 2020-01-21 DIAGNOSIS — R262 Difficulty in walking, not elsewhere classified: Secondary | ICD-10-CM

## 2020-01-21 NOTE — Therapy (Addendum)
[place], [place], [address] Phone: [phone]   Fax:  [phone]  Physical Therapy Treatment/Discharge  Patient Details  Name: Theresa Carter MRN: [id number] Date of Birth: 09-09-1970 Referring Provider (PT): Theresa Falcon, MD   Encounter Date: 01/21/2020   PT End of Session - 01/21/20 1504    Visit Number 7    Number of Visits 13    Date for PT Re-Evaluation 01/21/20    PT Start Time 1500    PT Stop Time 1540    PT Time Calculation (min) 40 min    Activity Tolerance Patient tolerated treatment well    Behavior During Therapy Theresa Carter for tasks assessed/performed           Past Medical History:  Diagnosis Date  . Asthma   . Chest pain 07/29/2018  . Diabetes mellitus without complication ([place])   . Ectopic pregnancy   . Hypertension     Past Surgical History:  Procedure Laterality Date  . CHOLECYSTECTOMY    . DILATION AND CURETTAGE OF UTERUS    . ENDOMETRIAL ABLATION    . HIP ARTHROSCOPY     x2  . SALPINGECTOMY    . TRIGGER FINGER RELEASE    . TUBAL LIGATION    . WRIST ARTHROSCOPY      There were no vitals filed for this visit.   Subjective Assessment - 01/21/20 1504    Subjective It is still the same. It is up and down just like it has been since my surgery.    How long can you walk comfortably? still having days with limping    Patient Stated Goals sleep, sit    Currently in Pain? Yes    Pain Score 3     Pain Location Hip    Pain Orientation Left    Pain Descriptors / Indicators Aching    Pain Radiating Towards to Lt knee, occasionally into Lt low back    Aggravating Factors  sitting with hip flexed    Pain Relieving Factors extending leg              [facility] PT Assessment - 01/21/20 0001      Assessment   Medical Diagnosis chronic hip pain    Referring Provider (PT) Theresa Falcon, MD    Onset Date/Surgical Date --   approx May, 2021     Observation/Other  Assessments   Focus on Therapeutic Outcomes (FOTO)  51% limited      PROM   Overall PROM Comments equal ROM bilaterally but painful at end range of IR      Strength   Right/Left Hip Right    Right Hip Flexion 4+/5    Right Hip Extension 5/5    Right Hip ABduction 4+/5    Left Hip Flexion 4+/5    Left Hip Extension 5/5    Left Hip ABduction 4/5      Palpation   Palpation comment severe TTP at L5, L4 central palpation, moderate pain at bil SIJ; no radicular symptoms with palpation                         [facility] Adult PT Treatment/Exercise - 01/21/20 0001      Knee/Hip Exercises: Stretches   Passive Hamstring Stretch Limitations supine with strap- center & lateral bias      Knee/Hip Exercises: Sidelying   Hip ABduction Limitations sidelying over and back    Clams  Lt x20                  PT Education - 01/21/20 1548    Education Details goals, progress, HEP, FOTO    Person(s) Educated Patient    Methods Explanation;Handout    Comprehension Verbalized understanding            PT Short Term Goals - 01/21/20 1509      PT SHORT TERM GOAL #1   Title pt will use stretches and HEP to maintain level pelvis    Status Achieved             PT Long Term Goals - 01/21/20 1509      PT LONG TERM GOAL #1   Title LE strength to 5/5    Baseline see flowsheet    Status On-going      PT LONG TERM GOAL #2   Title Able to tolerated seated position for at least 30 min    Baseline maybe 15-20 min comfortably, will extend hip for comfort    Status On-going      PT LONG TERM GOAL #3   Title pt will be able to ambulate comfortably for at least 1 week without incr hip pain    Baseline just walks at a slower pace    Status On-going      PT LONG TERM GOAL #4   Title --    Baseline --    Status Achieved                 Plan - 01/21/20 1534    Clinical Impression Statement At this time pt has demonstrated improvements in strength, ROM and  FOTOreports for function but continues to have the same pain levels with activity. I de believe that she needs imaging of lumbar spine as she is severely TTP at midline and has pain after laying supine for stretching. I will put her on hold for now to continue working on HEP for hip/core strengthening until she is able to f/u with PCP for evaluation of lumbar spine.    PT Treatment/Interventions ADLs/Self Care Home Management;Cryotherapy;Electrical Stimulation;Gait training;Ultrasound;Traction;Moist Heat;Iontophoresis 56m/ml Dexamethasone;Stair training;Functional mobility training;Therapeutic activities;Therapeutic exercise;Neuromuscular re-education;Manual techniques;Patient/family education;Passive range of motion;Dry needling;Taping;Joint Manipulations;Spinal Manipulations    PT Home Exercise Plan Lt knee to shoulder, supine on foam roll, self correction sacral torsion, AHSS, isometric hold legs extended/ER,[id number]    Consulted and Agree with Plan of Care Patient           Patient will benefit from skilled therapeutic intervention in order to improve the following deficits and impairments:  Abnormal gait, Difficulty walking, Increased muscle spasms, Decreased activity tolerance, Pain, Improper body mechanics, Decreased strength, Postural dysfunction  Visit Diagnosis: Pain in left hip - Plan: PT plan of care cert/re-cert  Difficulty in walking, not elsewhere classified - Plan: PT plan of care cert/re-cert  Muscle weakness (generalized) - Plan: PT plan of care cert/re-cert     Problem List Patient Active Problem List   Diagnosis Date Noted  . Left elbow pain 11/25/2019  . Allergies 08/11/2019  . Hidradenitis suppurativa 08/11/2019  . Essential hypertension 07/29/2018  . Asthma 07/29/2018  . Diabetes [place] 07/29/[place]  . Chronic pain 07/29/2018   Theresa Carter PT, DPT 01/21/20 3:50 PM   [facility]  [facility] [address]Phone: [phone]  Fax:  [phone] Name: LLouretta TantilloMRN: [id number]  of Birth: [birth year]  PHYSICAL THERAPY DISCHARGE SUMMARY  Visits from Start of Care: 7  Current functional level related to goals / functional outcomes: See above   Remaining deficits: See above   Education / Equipment: Anatomy of condition, POC, HEP, exercise form/rationale  Plan: Patient agrees to discharge.  Patient goals were partially met. Patient is being discharged due to the patient's request.  ?????     Synia Douglass C. Almee Pelphrey PT, DPT 03/01/20 6:17 PM

## 2020-02-03 DIAGNOSIS — E139 Other specified diabetes mellitus without complications: Secondary | ICD-10-CM | POA: Insufficient documentation

## 2020-02-22 ENCOUNTER — Encounter: Payer: Self-pay | Admitting: Physical Therapy

## 2020-02-23 ENCOUNTER — Encounter: Payer: Self-pay | Admitting: Internal Medicine

## 2020-02-23 ENCOUNTER — Ambulatory Visit: Payer: 59 | Admitting: Internal Medicine

## 2020-02-23 ENCOUNTER — Other Ambulatory Visit: Payer: Self-pay

## 2020-02-23 VITALS — BP 133/91 | HR 98 | Temp 98.0°F | Ht 65.0 in | Wt 226.2 lb

## 2020-02-23 DIAGNOSIS — I10 Essential (primary) hypertension: Secondary | ICD-10-CM

## 2020-02-23 DIAGNOSIS — M545 Low back pain, unspecified: Secondary | ICD-10-CM | POA: Diagnosis not present

## 2020-02-23 DIAGNOSIS — F4329 Adjustment disorder with other symptoms: Secondary | ICD-10-CM

## 2020-02-23 DIAGNOSIS — Z23 Encounter for immunization: Secondary | ICD-10-CM

## 2020-02-23 DIAGNOSIS — E119 Type 2 diabetes mellitus without complications: Secondary | ICD-10-CM

## 2020-02-23 LAB — POCT GLYCOSYLATED HEMOGLOBIN (HGB A1C): Hemoglobin A1C: 7.1 % — AB (ref 4.0–5.6)

## 2020-02-23 LAB — GLUCOSE, CAPILLARY: Glucose-Capillary: 176 mg/dL — ABNORMAL HIGH (ref 70–99)

## 2020-02-23 MED ORDER — LIRAGLUTIDE 18 MG/3ML ~~LOC~~ SOPN: 1.8000 mg | PEN_INJECTOR | Freq: Every day | SUBCUTANEOUS | 1 refills | Status: DC

## 2020-02-23 MED ORDER — RAMIPRIL 10 MG PO CAPS: 20.0000 mg | ORAL_CAPSULE | Freq: Every day | ORAL | 3 refills | Status: DC
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
## 2020-02-23 NOTE — Progress Notes (Signed)
° °  CC: HTN, hip pain   HPI:  [name] is a 49 y.o. with a PMHx as listed below who presents to the clinic for HTN, hip pain.   Please see the Encounters tab for problem-based Assessment & Plan regarding status of patient's acute and chronic conditions.  Past Medical History:  Diagnosis Date   Asthma    Chest pain 07/29/2018   Diabetes mellitus without complication ([place])    Ectopic pregnancy    Hypertension    Review of Systems: Review of Systems  Constitutional: Negative for chills, fever and weight loss.  Respiratory: Negative for cough and shortness of breath.   Gastrointestinal: Negative for abdominal pain, diarrhea, nausea and vomiting.  Musculoskeletal: Positive for back pain and joint pain.  Skin:       + abscesses, left groin  Neurological: Negative for focal weakness and weakness.  Psychiatric/Behavioral: The patient is nervous/anxious.        + stressed   Physical Exam:  Vitals:   02/23/20 1422  BP: (!) 133/91  Pulse: 98  Temp: 98 F (36.7 C)  TempSrc: Oral  SpO2: 100%  Weight: 226 lb 3.2 oz (102.6 kg)  Height: 5\' 5"  (1.651 m)   Physical Exam Vitals and nursing note reviewed.  Constitutional:      General: She is not in acute distress.    Appearance: She is obese.  HENT:     Head: Normocephalic and atraumatic.  Pulmonary:     Effort: Pulmonary effort is normal. No respiratory distress.  Skin:    General: Skin is warm and dry.  Neurological:     General: No focal deficit present.     Mental Status: She is alert and oriented to person, place, and time. Mental status is at baseline.  Psychiatric:        Attention and Perception: Attention and perception normal.        Mood and Affect: Affect is tearful.        Speech: Speech normal.        Behavior: Behavior normal. Behavior is cooperative.        Thought Content: Thought content normal.        Cognition and Memory: Cognition and memory normal.        Judgment: Judgment normal.     Assessment & Plan:   See Encounters Tab for problem based charting.  Patient discussed with Dr. Heber Marion

## 2020-02-23 NOTE — Assessment & Plan Note (Signed)
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  Her physical therapist has recommended she have additional imaging given her persistent pain on palpation in the lumbar region.  Ms. Nading denies any shooting pain, paresthesias that is new, focal weakness, urinary incontinence, bowel incontinence.  She denies any pain with movements.  Assessment/plan: No red flag symptoms.  On examination, she has full range of motion and mild tenderness to palpation in the L3-4. I suspect this is just persistent chronic back pain, however will order lumbar imaging should PT require it for further treatment.  -Lumbar radiographs

## 2020-02-23 NOTE — Assessment & Plan Note (Signed)
BP Readings from Last 3 Encounters:  02/23/20 (!) 133/91  11/23/19 135/85  09/16/19 121/80   Ms. Broner states that she picked up her new prescription of Prempro from the pharmacy, which time they told her to take just 1 tablet daily.  This is what she has been doing since she picked it up.  She denies any chest pain, shortness of breath.  She denies any difficulty with her medication or side effects.  Assessment/plan: Blood pressure still mildly elevated today, but not surprise given her dose has stayed the same.  Discussed to start taking 2 pills 1 in the morning 1 in the evening.  -Ramipril 10 mg twice daily -66-month follow-up
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
## 2020-02-23 NOTE — Assessment & Plan Note (Signed)
Lab Results  Component Value Date   HGBA1C 7.1 (A) 02/23/2020   Theresa Carter states she is not having any difficulty with her current medication regimen and would not like to consolidate at this time.  She continues to work on healthy eating and going to the gym.  Assessment/plan: Current medication regimen includes Invokana, Victoza, Metformin A1c today demonstrates continued good control of her diabetes.  No medication changes made  -Still needs diabetic eye exam, discuss next visit -Continue current medication regimen -Follow-up in 3 months

## 2020-02-24 ENCOUNTER — Telehealth: Payer: Self-pay | Admitting: *Deleted

## 2020-02-24 NOTE — Telephone Encounter (Addendum)
Information was faxed to CoverMyMeds for PA for Victoza.  Awaiting decision.  Sander Nephew, RN 02/24/2020 9:58 AM.  PA for Victoza was approved 02/24/2020 thru 02/23/2021.  Sander Nephew, RN 02/26/2020 10:13 AM.  Call to [place].  Can see PA problem wit amount that insurance is willing to cover.  Message from [employer] is that would like for patient to do mail order.  Pharmacy to reach out to patient to discuss options.  Sander Nephew, RN [id number]

## 2020-02-26 ENCOUNTER — Encounter: Payer: Self-pay | Admitting: Internal Medicine

## 2020-02-26 NOTE — Progress Notes (Signed)
Internal Medicine Clinic Attending  Case discussed with Dr. Basaraba  At the time of the visit.  We reviewed the resident's history and exam and pertinent patient test results.  I agree with the assessment, diagnosis, and plan of care documented in the resident's note.  

## 2020-03-03 ENCOUNTER — Encounter: Payer: Self-pay | Admitting: Internal Medicine

## 2020-03-03 DIAGNOSIS — E119 Type 2 diabetes mellitus without complications: Secondary | ICD-10-CM

## 2020-03-03 MED ORDER — PEN NEEDLES 33G X 4 MM MISC: 3 refills | Status: DC

## 2020-03-10 ENCOUNTER — Encounter: Payer: Self-pay | Admitting: Internal Medicine

## 2020-03-21 ENCOUNTER — Encounter: Payer: Self-pay | Admitting: Internal Medicine

## 2020-03-22 ENCOUNTER — Other Ambulatory Visit: Payer: Self-pay | Admitting: Student

## 2020-03-22 MED ORDER — MUPIROCIN 2 % EX OINT: TOPICAL_OINTMENT | Freq: Three times a day (TID) | CUTANEOUS | 2 refills | Status: DC

## 2020-03-22 NOTE — Progress Notes (Signed)
Patient requesting refill of mupirocin. Last dermatology note from 02/03/20 recommended patient continue this medication. Will refill at this time.

## 2020-03-24 ENCOUNTER — Institutional Professional Consult (permissible substitution): Payer: 59 | Admitting: Behavioral Health

## 2020-04-06 ENCOUNTER — Other Ambulatory Visit: Payer: Self-pay

## 2020-04-06 ENCOUNTER — Ambulatory Visit: Payer: 59 | Admitting: Behavioral Health

## 2020-04-06 DIAGNOSIS — F4323 Adjustment disorder with mixed anxiety and depressed mood: Secondary | ICD-10-CM
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
## 2020-04-19 ENCOUNTER — Ambulatory Visit: Payer: 59 | Admitting: Behavioral Health

## 2020-05-04 ENCOUNTER — Ambulatory Visit: Payer: Self-pay | Admitting: Behavioral Health

## 2020-05-11 ENCOUNTER — Ambulatory Visit: Payer: Self-pay | Admitting: Behavioral Health

## 2020-05-11 ENCOUNTER — Other Ambulatory Visit: Payer: Self-pay

## 2020-05-11 DIAGNOSIS — F4323 Adjustment disorder with mixed anxiety and depressed mood: Secondary | ICD-10-CM

## 2020-05-11 NOTE — BH Specialist Note (Signed)
Integrated Behavioral Health via Telemedicine Visit  05/11/2020 Theresa Carter [id number]  Number of [place] visits: 2/6 Session Start time: 2:30pm  Session End time: 3:00pm Total time: 30  Referring Provider: Dr. Jose Persia, MD Patient/Family location: At home in private [facility] Provider location: [facility] Office All persons participating in visit: Pt & Clinician Types of Service: Individual psychotherapy  I connected with Theresa Carter and/or Theresa Carter's self by Telephone  (Video is Caregility application) and verified that I am speaking with the correct person using two identifiers.Discussed confidentiality: Yes   I discussed the limitations of telemedicine and the availability of in person appointments.  Discussed there is a possibility of technology failure and discussed alternative modes of communication if that failure occurs.  I discussed that engaging in this telemedicine visit, they consent to the provision of behavioral healthcare and the services will be billed under their insurance.  Patient and/or legal guardian expressed understanding and consented to Telemedicine visit: Yes   Presenting Concerns: Patient and/or family reports the following symptoms/concerns: Heightened anx/dep due to holiday deaths in the family Duration of problem: months ; Severity of problem: moderate  Patient and/or Family's Strengths/Protective Factors: Social connections, Social and Emotional competence, Concrete supports in place (healthy food, safe environments, etc.) and Parental Resilience  Goals Addressed: Patient will: 1.  Reduce symptoms of: anxiety and depression  2.  Increase knowledge and/or ability of: coping skills and grief & loss issues in the family  3.  Demonstrate ability to: Increase healthy adjustment to current life circumstances and Begin healthy grieving over loss  Progress towards Goals: Ongoing  Interventions: Interventions  utilized:  Solution-Focused Strategies and Supportive Counseling Standardized Assessments completed: Not Needed  Patient and/or Family Response: Pt receptive to suggestions & psychoedu  Assessment: Patient currently experiencing overwhelming grief issues in the family.   Patient may benefit from journal work.  Plan: 1. Follow up with behavioral health clinician on : 2 wks 2. Behavioral recommendations: Journal now while family is grieving 3. Referral(s): [place] (In Clinic)  I discussed the assessment and treatment plan with the patient and/or parent/guardian. They were provided an opportunity to ask questions and all were answered. They agreed with the plan and demonstrated an understanding of the instructions.   They were advised to call back or seek an in-person evaluation if the symptoms worsen or if the condition fails to improve as anticipated.  Donnetta Hutching, LMFT

## 2020-05-23 ENCOUNTER — Other Ambulatory Visit: Payer: Self-pay | Admitting: Internal Medicine

## 2020-05-23 ENCOUNTER — Encounter: Payer: Self-pay | Admitting: Internal Medicine

## 2020-05-23 MED ORDER — MONTELUKAST SODIUM 10 MG PO TABS
10.0000 mg | ORAL_TABLET | Freq: Every day | ORAL | 1 refills | Status: DC
Start: 2020-05-23 — End: 2020-11-10

## 2020-05-24 ENCOUNTER — Ambulatory Visit: Payer: Self-pay | Admitting: Behavioral Health

## 2020-05-25 ENCOUNTER — Encounter: Payer: 59 | Admitting: Internal Medicine

## 2020-06-06 ENCOUNTER — Telehealth: Payer: Self-pay | Admitting: *Deleted

## 2020-06-06 DIAGNOSIS — E119 Type 2 diabetes mellitus without complications: Secondary | ICD-10-CM

## 2020-06-06 NOTE — Telephone Encounter (Signed)
Information from CoverMyMeds for PA for Invokana.  Anastasio Auerbach is not on patient's Insurance coverage formulary.  Patient will need to try and fail the formulary drugs first.  Diboll, Faxiga, Januvia, Ozempic, Glimperide, and Pioglitazone do not require a PA. Message to be sent to the Yellow Team to consider a change.  Sander Nephew, RN 06/06/2020 11:01 AM

## 2020-06-07 ENCOUNTER — Encounter: Payer: Self-pay | Admitting: Internal Medicine

## 2020-06-07 ENCOUNTER — Ambulatory Visit: Payer: Self-pay | Admitting: Behavioral Health

## 2020-06-07 ENCOUNTER — Other Ambulatory Visit: Payer: Self-pay

## 2020-06-07 DIAGNOSIS — F4323 Adjustment disorder with mixed anxiety and depressed mood: Secondary | ICD-10-CM

## 2020-06-07 MED ORDER — DAPAGLIFLOZIN PROPANEDIOL 5 MG PO TABS: 5.0000 mg | ORAL_TABLET | Freq: Every day | ORAL | 3 refills | Status: DC

## 2020-06-07 NOTE — Telephone Encounter (Signed)
Switched to farxiga 5 mg which appears to be on formulary for her. Thanks
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
## 2020-06-07 NOTE — Assessment & Plan Note (Signed)
Invokana no longer covered by patient's insurance, will switch to farxiga 5 mg and up titrate

## 2020-06-08 NOTE — Telephone Encounter (Signed)
Called patient. She is willing to try [place]

## 2020-06-14 ENCOUNTER — Other Ambulatory Visit: Payer: Self-pay

## 2020-06-14 ENCOUNTER — Encounter: Payer: Self-pay | Admitting: Internal Medicine

## 2020-06-14 ENCOUNTER — Ambulatory Visit (INDEPENDENT_AMBULATORY_CARE_PROVIDER_SITE_OTHER): Payer: Self-pay | Admitting: Internal Medicine

## 2020-06-14 VITALS — BP 145/97 | HR 99 | Temp 99.5°F | Ht 65.0 in | Wt 226.5 lb

## 2020-06-14 DIAGNOSIS — G894 Chronic pain syndrome: Secondary | ICD-10-CM

## 2020-06-14 DIAGNOSIS — I10 Essential (primary) hypertension: Secondary | ICD-10-CM

## 2020-06-14 DIAGNOSIS — L732 Hidradenitis suppurativa: Secondary | ICD-10-CM

## 2020-06-14 DIAGNOSIS — F4329 Adjustment disorder with other symptoms: Secondary | ICD-10-CM

## 2020-06-14 DIAGNOSIS — E119 Type 2 diabetes mellitus without complications: Secondary | ICD-10-CM

## 2020-06-14 LAB — POCT GLYCOSYLATED HEMOGLOBIN (HGB A1C): Hemoglobin A1C: 7.4 % — AB (ref 4.0–5.6)

## 2020-06-14 LAB — GLUCOSE, CAPILLARY: Glucose-Capillary: 160 mg/dL — ABNORMAL HIGH (ref 70–99)

## 2020-06-14 MED ORDER — LIRAGLUTIDE 18 MG/3ML ~~LOC~~ SOPN: 1.8000 mg | PEN_INJECTOR | Freq: Every day | SUBCUTANEOUS | 1 refills | Status: DC

## 2020-06-14 NOTE — Progress Notes (Signed)
   CC: Diabetes  HPI:  [name] is a 50 y.o. with a PMHx as listed below who presents to the clinic for diabetes.   Please see the Encounters tab for problem-based Assessment & Plan regarding status of patient's acute and chronic conditions.  Past Medical History:  Diagnosis Date  . Asthma   . Chest pain 07/29/2018  . Diabetes mellitus without complication ([place])   . Ectopic pregnancy   . Hypertension    Review of Systems: Review of Systems  Constitutional: Negative for chills, fever, malaise/fatigue and weight loss.  Gastrointestinal: Positive for nausea. Negative for abdominal pain, diarrhea and vomiting.  Musculoskeletal: Positive for joint pain (chronic).   Physical Exam:  Vitals:   06/14/20 1523  BP: (!) 145/97  Pulse: 99  Temp: 99.5 F (37.5 C)  TempSrc: Oral  SpO2: 100%  Weight: 226 lb 8 oz (102.7 kg)  Height: 5\' 5"  (1.651 m)   Physical Exam Vitals and nursing note reviewed.  Constitutional:      General: She is not in acute distress.    Appearance: She is obese.  Cardiovascular:     Pulses:          Dorsalis pedis pulses are 2+ on the right side and 2+ on the left side.  Musculoskeletal:     Right lower leg: No edema.     Left lower leg: No edema.     Right foot: Normal range of motion. No deformity, bunion, Charcot foot, foot drop or prominent metatarsal heads.     Left foot: Normal range of motion. No deformity, bunion, Charcot foot, foot drop or prominent metatarsal heads.  Feet:     Right foot:     Skin integrity: Skin integrity normal.     Toenail Condition: Right toenails are normal.     Left foot:     Skin integrity: Skin integrity normal.     Toenail Condition: Left toenails are normal.  Neurological:     Mental Status: She is alert.  Psychiatric:        Mood and Affect: Mood normal.        Behavior: Behavior normal.    Assessment & Plan:   See Encounters Tab for problem based charting.  Patient discussed with Dr. Heber Mescalero

## 2020-06-14 NOTE — Patient Instructions (Signed)
It was nice seeing you today! Thank you for choosing [facility] for your Primary Care.    Today we talked about:   1. Diabetes: Continue with your current regimen. Try eating a little snack after Wilder Glade and see if that helps  2. For your pain, make an appointment to discuss this further before you run out if possible.

## 2020-06-14 NOTE — Assessment & Plan Note (Addendum)
Patient states she is continuing to follow-up with the at bedtime clinic and having good response.  She is currently on on Finasteride and Doxycycline.  She states the clinic continues to refill the prescription for her.

## 2020-06-15 NOTE — Assessment & Plan Note (Signed)
Ms. Maul states that she has started therapy and is feeling optimistic that it will be helpful for her.  She continues to have significant stressors at home, however feels like she is handling it okay right now.  Assessment/plan: -Continue to monitor

## 2020-06-15 NOTE — Assessment & Plan Note (Signed)
BP: (!) 145/97  Theresa Carter states that her blood pressure was elevated when she came in but on repeat it had improved to systolic approximately 367.  She denies any concerns with her medication at this time.  Assessment/plan: Blood pressure well controlled at this time.  -Continue ramipril 20 mg daily

## 2020-06-15 NOTE — Assessment & Plan Note (Addendum)
Ms. Michaelson states that she is upset her insurance no longer covered Invokana as she had been on it for very long time and tolerated it well.  She has been switched to [place] which is covered, however she is having nausea after taking it for a few hours.  She is not eating after taking it though.  She is also continuing on Victoza and Metformin without any difficulty.  Assessment/plan: Lab Results  Component Value Date   HGBA1C 7.4 (A) 06/14/2020   A1c today is very slightly increased from her last visit, however patient cites significant stressors in the past couple months as the reason.  We discussed that we can try switching to Jardiance which is the other SGLT2 inhibitor covered by her insurance versus trying to eat a small snack after taking Wilder Glade to see if that settles her stomach versus trying to pursue a prior authorization through her new insurance to cover Invokana due to nausea with [place].  Patient did not have a particular preference between these options, with the exception of not wanting to switch to [place].  For now we will continue with [place] and if patient continues to not tolerate well we will pursue the prior Auth then.  Diabetic foot examination today; with no abnormalities.  Patient states that she has an Dr. Parks Ranger she sees and will be scheduling a visit with him shortly to have an eye exam.  -Farxiga 5 mg daily -Victoza 1.8 mg daily -Metformin 1000 mg twice daily -A1c in 3 months -61-month follow-up

## 2020-06-15 NOTE — Assessment & Plan Note (Signed)
At the end of patient visit, Theresa Carter did mention that she is on her last few pills of Flexeril and Norco.  I was under the impression that she follows with the pain clinic currently, however she has not followed up with the pain clinic [place].  She states she was previously on Norco 7.5-325 mg every 8 hours, receiving 120 tablets/month, however she felt this dose was too high for her.  She notes that during the day she is able to manage the pain without any medication assistance, however the pain keeps her up at night the most.  She is currently using her Norco by taking half to a full tablet before bedtime, as well as Flexeril before bedtime with the doses taken about an hour from each other.  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  We briefly discussed that her current regimen is mainly treating her hip pain, but is not recommended therapy for fibromyalgia.  I also discussed that we will need to spend more time discussing her pain sources and previous pain medication management prior to sending in any refills.  I recommended she make a separate appointment, which can be telehealth, to discuss this further.  -Recommended separate appointment for this problem

## 2020-06-17 ENCOUNTER — Ambulatory Visit (INDEPENDENT_AMBULATORY_CARE_PROVIDER_SITE_OTHER): Payer: PRIVATE HEALTH INSURANCE | Admitting: Internal Medicine

## 2020-06-17 ENCOUNTER — Other Ambulatory Visit: Payer: Self-pay

## 2020-06-17 DIAGNOSIS — G894 Chronic pain syndrome: Secondary | ICD-10-CM

## 2020-06-17 MED ORDER — CELECOXIB 200 MG PO CAPS: 200.0000 mg | ORAL_CAPSULE | Freq: Every day | ORAL | 0 refills | Status: DC
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
## 2020-06-17 NOTE — Progress Notes (Signed)
  [facility] Residency Telephone Encounter Continuity Care Appointment  HPI:   This telephone encounter was created for [name] on 06/17/2020 for the following purpose/cc chronic pain. Please see assessment/plan for additional details.    Past Medical History:  Past Medical History:  Diagnosis Date  . Asthma   . Chest pain 07/29/2018  . Diabetes mellitus without complication ([place])   . Ectopic pregnancy   . Hypertension       ROS:   + low back pain, neck pain, left hip pain   Assessment / Plan / Recommendations:   Please see A&P under problem oriented charting for assessment of the patient's acute and chronic medical conditions.   As always, pt is advised that if symptoms worsen or new symptoms arise, they should go to an urgent care facility or to to ER for further evaluation.   Consent and Medical Decision Making:   Patient discussed with Dr. Angelia Mould  This is a telephone encounter between Theresa Carter and Theresa Carter on 06/17/2020 for chronic pain. The visit was conducted with the patient located at home and Theresa Carter at [facility]. The patient's identity was confirmed using their DOB and current address. The patient has consented to being evaluated through a telephone encounter and understands the associated risks (an examination cannot be done and the patient may need to come in for an appointment) / benefits (allows the patient to remain at home, decreasing exposure to coronavirus). I personally spent 20 minutes on medical discussion.

## 2020-06-20 MED ORDER — CYCLOBENZAPRINE HCL 10 MG PO TABS
10.0000 mg | ORAL_TABLET | Freq: Every day | ORAL | 2 refills | Status: DC
Start: 2020-06-20 — End: 2020-11-30

## 2020-06-21 ENCOUNTER — Other Ambulatory Visit: Payer: Self-pay

## 2020-06-21 ENCOUNTER — Ambulatory Visit: Payer: PRIVATE HEALTH INSURANCE | Admitting: Behavioral Health

## 2020-06-21 DIAGNOSIS — F4323 Adjustment disorder with mixed anxiety and depressed mood: Secondary | ICD-10-CM

## 2020-06-21 NOTE — BH Specialist Note (Signed)
Integrated Behavioral Health via Telemedicine Visit  06/21/2020 Theresa Carter [id number]  Number of [place] visits: 4/6 Session Start time: 2:10pm  Session End time: 2:50pm Total time: 40   Referring Provider: Dr. Jose Persia, MD Patient/Family location: Pt in private location in the home [facility] Provider location: [facility] Office All persons participating in visit: Pt & Clinician Types of Service: Individual psychotherapy  I connected with Theresa Carter and/or Theresa Carter's self by Telephone  (Video is Caregility application) and verified that I am speaking with the correct person using two identifiers.Discussed confidentiality: Yes   I discussed the limitations of telemedicine and the availability of in person appointments.  Discussed there is a possibility of technology failure and discussed alternative modes of communication if that failure occurs.  I discussed that engaging in this telemedicine visit, they consent to the provision of behavioral healthcare and the services will be billed under their insurance.  Patient and/or legal guardian expressed understanding and consented to Telemedicine visit: Yes   Presenting Concerns: Patient and/or family reports the following symptoms/concerns: heightened concerns for Dtr having social anxiety issues Duration of problem: within past few months w/Dtr's disclosure of CSA; Severity of problem: moderate  Patient and/or Family's Strengths/Protective Factors: Social and Emotional competence, Concrete supports in place (healthy food, safe environments, etc.), Sense of purpose, Caregiver has knowledge of parenting & child development and Parental Resilience  Goals Addressed: Patient will: 1.  Reduce symptoms of: anxiety, depression and stress  2.  Increase knowledge and/or ability of: coping skills, stress reduction and parenting tips  3.  Demonstrate ability to: Increase healthy adjustment to current life  circumstances  Progress towards Goals: Ongoing  Interventions: Interventions utilized:  Supportive Counseling and Psychoeducation and/or Health Education Standardized Assessments completed: Not Needed  Patient and/or Family Response: Pt receptive to call today & wants to cont sessions  Assessment: Patient currently experiencing elevated concern for Dtr & her friend grp being bullies.   Patient may benefit from cont'd sessions & coping skills to address trauma.  Plan: 1. Follow up with behavioral health clinician on : 3 wks out on telehealth for 45 min 2. Behavioral recommendations: Cont to support Dtr & her exp of recent disclosure of CSA 3. Referral(s): Counselor  I discussed the assessment and treatment plan with the patient and/or parent/guardian. They were provided an opportunity to ask questions and all were answered. They agreed with the plan and demonstrated an understanding of the instructions.   They were advised to call back or seek an in-person evaluation if the symptoms worsen or if the condition fails to improve as anticipated.  Donnetta Hutching, LMFT

## 2020-06-21 NOTE — Progress Notes (Signed)
Internal Medicine Clinic Attending  Case discussed with Dr. Basaraba  At the time of the visit.  We reviewed the resident's history and exam and pertinent patient test results.  I agree with the assessment, diagnosis, and plan of care documented in the resident's note.  

## 2020-06-23 NOTE — Progress Notes (Signed)
Internal Medicine Clinic Attending  Case discussed with Dr. Basaraba  At the time of the visit.  We reviewed the resident's history and exam and pertinent patient test results.  I agree with the assessment, diagnosis, and plan of care documented in the resident's note.  

## 2020-06-30 ENCOUNTER — Encounter: Payer: PRIVATE HEALTH INSURANCE | Admitting: Student

## 2020-07-13 ENCOUNTER — Ambulatory Visit: Payer: PRIVATE HEALTH INSURANCE | Admitting: Behavioral Health

## 2020-07-13 ENCOUNTER — Other Ambulatory Visit: Payer: Self-pay

## 2020-07-13 DIAGNOSIS — F4323 Adjustment disorder with mixed anxiety and depressed mood: Secondary | ICD-10-CM

## 2020-07-13 NOTE — BH Specialist Note (Signed)
Integrated Behavioral Health via Telemedicine Visit  07/13/2020 Theresa Carter [id number]  Number of [place] visits: 5/6 Session Start time: 11:00am  Session End time: 11:45am Total time: 45   Referring Provider: Dr. Jose Persia, MD Patient/Family location: Pt at home in private [facility] Provider location: [facility] Office All persons participating in visit: Pt & Clinician Types of Service: Individual psychotherapy  I connected with Theresa Carter and/or Theresa Carter's self via  Telephone or Video Enabled Telemedicine Application  (Video is Caregility application) and verified that I am speaking with the correct person using two identifiers. Discussed confidentiality: Yes   I discussed the limitations of telemedicine and the availability of in person appointments.  Discussed there is a possibility of technology failure and discussed alternative modes of communication if that failure occurs.  I discussed that engaging in this telemedicine visit, they consent to the provision of behavioral healthcare and the services will be billed under their insurance.  Patient and/or legal guardian expressed understanding and consented to Telemedicine visit: Yes   Presenting Concerns: Patient and/or family reports the following symptoms/concerns: Worry for Dtr & her Sx of social anx/trauma Duration of problem: wks; Severity of problem: moderate  Patient and/or Family's Strengths/Protective Factors: Social connections, Social and Emotional competence, Concrete supports in place (healthy food, safe environments, etc.), Sense of purpose and Caregiver has knowledge of parenting & child development  Goals Addressed: Patient will: 1.  Reduce symptoms of: anxiety, depression and stress  2.  Increase knowledge and/or ability of: coping skills and stress reduction  3.  Demonstrate ability to: Increase healthy adjustment to current life circumstances and Begin healthy grieving  over loss  Progress towards Goals: Ongoing  Interventions: Interventions utilized:  Solution-Focused Strategies, Mindfulness or Relaxation Training and Supportive Counseling Standardized Assessments completed: Not Needed  Patient and/or Family Response: Pt receptive to call today  Assessment: Patient currently experiencing reduced anxiety from intense concerns for Dtr who is doing better. Her BIL's Wife is visiting soon. Pt & Husb are travelling soon; Pt is less worried for leaving her Dtr. Her Dtr has trips & visitors to look forward to in the near future.  Patient may benefit from addt'l support & resources for coping tools.  Plan: 1. Follow up with behavioral health clinician on : 2-3 wks f/u on telehealth 2. Behavioral recommendations: Cont to support Dtr in her mtl hlth needs; care for self & Family 3. Referral(s): [place] (In Clinic)  I discussed the assessment and treatment plan with the patient and/or parent/guardian. They were provided an opportunity to ask questions and all were answered. They agreed with the plan and demonstrated an understanding of the instructions.   They were advised to call back or seek an in-person evaluation if the symptoms worsen or if the condition fails to improve as anticipated.  Donnetta Hutching, LMFT

## 2020-08-01 ENCOUNTER — Ambulatory Visit: Payer: PRIVATE HEALTH INSURANCE | Admitting: Behavioral Health

## 2020-08-01 ENCOUNTER — Other Ambulatory Visit: Payer: Self-pay

## 2020-08-01 DIAGNOSIS — F331 Major depressive disorder, recurrent, moderate: Secondary | ICD-10-CM

## 2020-08-01 DIAGNOSIS — F419 Anxiety disorder, unspecified: Secondary | ICD-10-CM

## 2020-08-01 NOTE — BH Specialist Note (Signed)
Integrated Behavioral Health via Telemedicine Visit  08/01/2020 Theresa Carter [id number]  Number of [place] visits: 6/6 Session Start time: 1:30pm  Session End time: 2:15pm Total time: 7   Referring Provider: Dr. Jose Persia, MD Patient/Family location: Pt @ home in private [facility] Provider location: [facility] Office All persons participating in visit: Pt & Clinician Types of Service: Individual psychotherapy and Health Promotion  I connected with Mikela Senn and/or Lucilla Lame Cousineau's self via  Telephone or Video Enabled Telemedicine Application  (Video is Caregility application) and verified that I am speaking with the correct person using two identifiers. Discussed confidentiality: Yes   I discussed the limitations of telemedicine and the availability of in person appointments.  Discussed there is a possibility of technology failure and discussed alternative modes of communication if that failure occurs.  I discussed that engaging in this telemedicine visit, they consent to the provision of behavioral healthcare and the services will be billed under their insurance.  Patient and/or legal guardian expressed understanding and consented to Telemedicine visit: Yes   Presenting Concerns: Patient and/or family reports the following symptoms/concerns: Pt's Dtr related to her & said r/s my appt. Discussed a r/s option for f:f for Dtr. Duration of problem: years; Severity of problem: moderate  Patient and/or Family's Strengths/Protective Factors: Social connections, Social and Emotional competence, Concrete supports in place (healthy food, safe environments, etc.), Sense of purpose and Physical Health (exercise, healthy diet, medication compliance, etc.)  Goals Addressed: Patient will: 1.  Reduce symptoms of: anxiety, depression and stress  2.  Increase knowledge and/or ability of: coping skills, self-management skills and stress reduction  3.  Demonstrate  ability to: Increase healthy adjustment to current life circumstances and Begin healthy grieving over loss  Progress towards Goals: Ongoing  Interventions: Interventions utilized:  Solution-Focused Strategies, Supportive Counseling and Preventative Services/Health Promotion Standardized Assessments completed: Not Needed  Patient and/or Family Response: Pt receptive to visit today & requesting future sessions.  Assessment: Patient currently experiencing upset over Dtr cancelling appt. Pt related that Family Trip to [place] was challenging. Husb's Siblings are mostly difficult.  Patient may benefit from cont'd psychotherapy for Hx of CSA & other traumatic events.   Plan: 1. Follow up with behavioral health clinician on : 2-3 wks for 60 min on telehealth 2. Behavioral recommendations: Journal btwn sessions to further process 3. Referral(s): [place] (In Clinic)  I discussed the assessment and treatment plan with the patient and/or parent/guardian. They were provided an opportunity to ask questions and all were answered. They agreed with the plan and demonstrated an understanding of the instructions.   They were advised to call back or seek an in-person evaluation if the symptoms worsen or if the condition fails to improve as anticipated.  Donnetta Hutching, LMFT

## 2020-08-25 ENCOUNTER — Other Ambulatory Visit: Payer: Self-pay

## 2020-08-25 ENCOUNTER — Ambulatory Visit: Payer: PRIVATE HEALTH INSURANCE | Admitting: Behavioral Health

## 2020-08-25 DIAGNOSIS — F419 Anxiety disorder, unspecified: Secondary | ICD-10-CM

## 2020-08-25 NOTE — BH Specialist Note (Signed)
Integrated Behavioral Health via Telemedicine Visit  08/25/2020 Theresa Carter [id number]  Number of Integrated Behavioral Health visits: 7 Session Start time: 10:30am  Session End time: 11:00am Total time: 30  Referring Provider: Dr. Jose Persia, MD Patient/Family location: Pt at home in private [facility] Provider location: Working remotely in private All persons participating in visit: Pt & Clinician Types of Service: Individual psychotherapy  I connected with Theresa Carter and/or Theresa Carter's self via  Telephone or Video Enabled Telemedicine Application  (Video is Caregility application) and verified that I am speaking with the correct person using two identifiers. Discussed confidentiality: Yes   I discussed the limitations of telemedicine and the availability of in person appointments.  Discussed there is a possibility of technology failure and discussed alternative modes of communication if that failure occurs.  I discussed that engaging in this telemedicine visit, they consent to the provision of behavioral healthcare and the services will be billed under their insurance.  Patient and/or legal guardian expressed understanding and consented to Telemedicine visit: Yes   Presenting Concerns: Patient and/or family reports the following symptoms/concerns: elevated anxiety Duration of problem: this morning; Severity of problem: mild  Patient and/or Family's Strengths/Protective Factors: Social connections, Social and Emotional competence, Concrete supports in place (healthy food, safe environments, etc.), Sense of purpose and Physical Health (exercise, healthy diet, medication compliance, etc.)  Goals Addressed: Patient will: 1.  Reduce symptoms of: anxiety and stress  2.  Increase knowledge and/or ability of: coping skills, healthy habits and stress reduction  3.  Demonstrate ability to: Increase healthy adjustment to current life circumstances  Progress towards  Goals: Ongoing  Interventions: Interventions utilized:  Solution-Focused Strategies and Supportive Counseling Standardized Assessments completed: Not Needed  Patient and/or Family Response: Pt receptive to today's session & requesting future appt  Assessment: Patient currently experiencing heightened anxiety Carter due to Husb's health situation & upcoming new Business venture in trucking.   Patient may benefit from cont'd check-in for support/reflection.  Plan: 1. Follow up with behavioral health clinician on : Mid-May for 60 min tele 2. Behavioral recommendations: Lessen caffeine intake to reduce anxiety, support Husb in ways suggested & take his health situation one step at a time 3. Referral(s): [place] (In Clinic)  I discussed the assessment and treatment plan with the patient and/or parent/guardian. They were provided an opportunity to ask questions and all were answered. They agreed with the plan and demonstrated an understanding of the instructions.   They were advised to call back or seek an in-person evaluation if the symptoms worsen or if the condition fails to improve as anticipated.  Donnetta Hutching, LMFT

## 2020-09-20 ENCOUNTER — Other Ambulatory Visit: Payer: Self-pay

## 2020-09-20 ENCOUNTER — Ambulatory Visit: Payer: PRIVATE HEALTH INSURANCE | Admitting: Behavioral Health

## 2020-09-20 DIAGNOSIS — F4329 Adjustment disorder with other symptoms: Secondary | ICD-10-CM

## 2020-09-20 DIAGNOSIS — F331 Major depressive disorder, recurrent, moderate: Secondary | ICD-10-CM

## 2020-09-20 DIAGNOSIS — F419 Anxiety disorder, unspecified: Secondary | ICD-10-CM

## 2020-09-20 NOTE — BH Specialist Note (Signed)
Integrated Behavioral Health via Telemedicine Visit  09/20/2020 Theresa Carter [id number]  Number of Integrated Behavioral Health visits: 8 Session Start time: 10:00am  Session End time: 10:45am Total time: 45   Referring Provider: Dr. Jose Persia, MD Patient/Family location: Pt in private [facility] Provider location: [facility] Office All persons participating in visit: Pt & Clinician Types of Service: Individual psychotherapy  I connected with Theresa Carter and/or Theresa Carter's self via  Telephone or Video Enabled Telemedicine Application  (Video is Caregility application) and verified that I am speaking with the correct person using two identifiers. Discussed confidentiality: Yes   I discussed the limitations of telemedicine and the availability of in person appointments.  Discussed there is a possibility of technology failure and discussed alternative modes of communication if that failure occurs.  I discussed that engaging in this telemedicine visit, they consent to the provision of behavioral healthcare and the services will be billed under their insurance.  Patient and/or legal guardian expressed understanding and consented to Telemedicine visit: Yes   Presenting Concerns: Patient and/or family reports the following symptoms/concerns: elevated concern/anx for upcoming wknd w/family Duration of problem: lifelong w/family issues; Severity of problem: mild to moderate  Patient and/or Family's Strengths/Protective Factors: Social connections, Social and Emotional competence, Concrete supports in place (healthy food, safe environments, etc.) and Sense of purpose  Goals Addressed: Patient will: 1.  Reduce symptoms of: anxiety and depression  2.  Increase knowledge and/or ability of: coping skills and stress reduction  3.  Demonstrate ability to: Increase healthy adjustment to current life circumstances  Progress towards Goals: Ongoing  Interventions: Interventions  utilized:  Solution-Focused Strategies and Supportive Counseling Standardized Assessments completed: screeners prn  Patient and/or Family Response: Pt receptive to call & requests future appt  Assessment: Patient currently experiencing worry due to upcoming family trip. Concern for Dtr's exp.  Patient may benefit from cont'd support for family circumstances.  Plan: 1. Follow up with behavioral health clinician on : 2-3 wks on telehealth for 60 min 2. Behavioral recommendations: Use coping skills suggested re: health issues, family dynamics, & FOO issues. 3. Referral(s): [place] (In Clinic)  I discussed the assessment and treatment plan with the patient and/or parent/guardian. They were provided an opportunity to ask questions and all were answered. They agreed with the plan and demonstrated an understanding of the instructions.   They were advised to call back or seek an in-person evaluation if the symptoms worsen or if the condition fails to improve as anticipated.  Donnetta Hutching, LMFT

## 2020-10-04 ENCOUNTER — Telehealth: Payer: Self-pay

## 2020-10-04 NOTE — Telephone Encounter (Signed)
Pt reports she went outside to do some gardening and while outside, she started coughing "uncontrollably", states it felt like something was in her throat.  Pt has a hx of asthma and does report taking albuterol inhaler after this episode.  She states she was coughing so violently that she started vomiting phlegm.  She told her family to call 911, when EMS arrived the "episode" passed and she was breathing and feeling fine. Pt now c/o her chest hurting or "soreness", but only when she laughs or coughs.    Appt for evaluation made for tomorrow at 1315 w/ Dr. Darrick Meigs, yellow team is full.  Pt was instructed if she develops any SOB, chest pain, coughing spells which don't subside or any difficulty breathing to seek emergency care.  She verbalized understanding.  SChaplin, RN,BSN

## 2020-10-05 ENCOUNTER — Ambulatory Visit (INDEPENDENT_AMBULATORY_CARE_PROVIDER_SITE_OTHER): Payer: Self-pay | Admitting: Student

## 2020-10-05 VITALS — BP 153/97 | HR 93 | Temp 98.4°F | Ht 65.0 in | Wt 219.5 lb

## 2020-10-05 DIAGNOSIS — R059 Cough, unspecified: Secondary | ICD-10-CM

## 2020-10-05 DIAGNOSIS — I10 Essential (primary) hypertension: Secondary | ICD-10-CM

## 2020-10-05 DIAGNOSIS — R7989 Other specified abnormal findings of blood chemistry: Secondary | ICD-10-CM

## 2020-10-05 DIAGNOSIS — E119 Type 2 diabetes mellitus without complications: Secondary | ICD-10-CM

## 2020-10-05 DIAGNOSIS — R0989 Other specified symptoms and signs involving the circulatory and respiratory systems: Secondary | ICD-10-CM

## 2020-10-05 DIAGNOSIS — R197 Diarrhea, unspecified: Secondary | ICD-10-CM

## 2020-10-05 DIAGNOSIS — L74511 Primary focal hyperhidrosis, face: Secondary | ICD-10-CM

## 2020-10-05 LAB — POCT GLYCOSYLATED HEMOGLOBIN (HGB A1C): Hemoglobin A1C: 7.2 % — AB (ref 4.0–5.6)

## 2020-10-05 LAB — GLUCOSE, CAPILLARY: Glucose-Capillary: 165 mg/dL — ABNORMAL HIGH (ref 70–99)

## 2020-10-05 NOTE — Progress Notes (Signed)
Office Visit   Patient ID: Theresa Carter, female    DOB: 02-01-1971, 50 y.o.   MRN: [id number]   PCP: Jose Persia, MD   Subjective:   CC: choking episode, headache, diarrhea  HPI:  [name] is a 50 y.o. woman with history as below who presents to clinic for the above chief complaints. Her last clinic visit was on 06/17/20 with her PCP, Dr. Charleen Kirks.   To see the details of this patient's management of their acute and chronic problems, please refer to the Assessment & Plan under the Encounters tab.    Review of Systems:   Review of Systems  Constitutional:  Positive for diaphoresis and malaise/fatigue. Negative for chills, fever and weight loss.  HENT:  Negative for congestion, sinus pain and sore throat.   Eyes:  Negative for blurred vision and discharge.  Respiratory:  Positive for cough. Negative for sputum production, shortness of breath and wheezing.   Cardiovascular:  Negative for chest pain, palpitations and leg swelling.  Gastrointestinal:  Positive for diarrhea, nausea and vomiting. Negative for abdominal pain, blood in stool and constipation.  Genitourinary:  Negative for dysuria.  Musculoskeletal:  Negative for myalgias.  Skin:  Negative for itching and rash.  Neurological:  Positive for headaches. Negative for dizziness.  Psychiatric/Behavioral:  The patient is nervous/anxious.    Past Medical History:  Diagnosis Date   Asthma    Chest pain 07/29/2018   Diabetes mellitus without complication ([place])    Ectopic pregnancy    Hypertension       ACTIVE MEDICATIONS   Outpatient Medications Prior to Visit  Medication Sig Dispense Refill   albuterol (VENTOLIN HFA) 108 (90 Base) MCG/ACT inhaler Inhale 2 puffs into the lungs every 6 (six) hours as needed for wheezing or shortness of breath. 8 g 3   cyclobenzaprine (FLEXERIL) 10 MG tablet Take 1 tablet (10 mg total) by mouth at bedtime. 30 tablet 2   dapagliflozin propanediol (FARXIGA) 5 MG TABS tablet  Take 1 tablet (5 mg total) by mouth daily before breakfast. 90 tablet 3   fluticasone (FLONASE) 50 MCG/ACT nasal spray Place 1-2 sprays into both nostrils daily. 18.2 mL 3   HYDROcodone-acetaminophen (NORCO) 7.5-325 MG tablet Take 1 tablet by mouth every 6 (six) hours as needed for pain.     Insulin Pen Needle (PEN NEEDLES) 33G X 4 MM MISC Use to inject diabetic medication into the skin 1 time daily 100 each 3   liraglutide (VICTOZA) 18 MG/3ML SOPN Inject 1.8 mg into the skin daily. 27 mL 1   metFORMIN (GLUCOPHAGE) 1000 MG tablet Take 1 tablet (1,000 mg total) by mouth 2 (two) times daily. 180 tablet 3   montelukast (SINGULAIR) 10 MG tablet Take 1 tablet (10 mg total) by mouth daily. 90 tablet 1   mupirocin ointment (BACTROBAN) 2 % Apply topically 3 (three) times daily. 30 g 2   omeprazole (PRILOSEC) 40 MG capsule Take 1 capsule (40 mg total) by mouth daily. 90 capsule 3   ramipril (ALTACE) 10 MG capsule Take 2 capsules (20 mg total) by mouth daily. 180 capsule 3   celecoxib (CELEBREX) 200 MG capsule Take 1 capsule (200 mg total) by mouth daily. 14 capsule 0   No facility-administered medications prior to visit.   Objective:   BP (!) 153/97   Pulse 93   Temp 98.4 F (36.9 C) (Oral)   Ht 5\' 5"  (1.651 m)   Wt 219 lb 8 oz (99.6 kg)  SpO2 100%   BMI 36.53 kg/m  Wt Readings from Last 3 Encounters:  10/05/20 219 lb 8 oz (99.6 kg)  06/14/20 226 lb 8 oz (102.7 kg)  02/23/20 226 lb 3.2 oz (102.6 kg)   BP Readings from Last 3 Encounters:  10/05/20 (!) 153/97  06/14/20 (!) 145/97  02/23/20 (!) 133/91   Constitutional: anxious-appearing woman sitting in chair, face is diaphoretic, in no acute distress HENT: normocephalic atraumatic, mucous membranes slightly dry Eyes: conjunctiva non-erythematous Neck: supple, small (1/2 cm) nodule in R lobe of thyroid Cardiovascular: tachycardic, regular rhythm, no m/r/g, no lower extremity edema Pulmonary/Chest: normal work of breathing on room air,  lungs clear to auscultation bilaterally Abdominal: soft, non-tender, non-distended MSK: normal bulk and tone Neurological: alert & oriented x 3, normal gait Skin: warm, face is diaphoretic and flushed Psych: normal mood and affect, anxious-appearing  Health Maintenance:   Health Maintenance  Topic Date Due   PNEUMOCOCCAL POLYSACCHARIDE VACCINE AGE 63-64 HIGH RISK  Never done   Pneumococcal Vaccine 73-43 Years old (1 - PCV) Never done   FOOT EXAM  Never done   OPHTHALMOLOGY EXAM  Never done   Hepatitis C Screening  Never done   TETANUS/TDAP  Never done   COLONOSCOPY (Pts 45-34yrs Insurance coverage will need to be confirmed)  Never done   PAP SMEAR-Modifier  05/22/2019   COVID-19 Vaccine (3 - Moderna risk series) 11/24/2019   INFLUENZA VACCINE  11/21/2020   HEMOGLOBIN A1C  04/06/2021   HIV Screening  Completed   HPV VACCINES  Aged Out   Assessment & Plan:   Problem List Items Addressed This Visit       Cardiovascular and Mediastinum   Essential hypertension    BP today 157/96. Patient reports she is adherent to her ramipril 20 mg daily. States she has been able to take despite her nausea.   A/P: Given patient's increased anxiety and stress from current constellation of symptoms, I will note make any medication adjustments at this time.  - continue ramipril 20 mg daily - RTC in 68-month for PCP follow-up         Endocrine   Diabetes mellitus type II, non insulin dependent (HCC) - Primary    Hemoglobin A1c is 7.2% today, down slightly from 7.4% at last visit 4 months ago (06/15/20). She reports she has been taking her metformin 1000 mg twice daily, Victoza 1.8 mg daily, however has still not been able to tolerate [place].   Per review of last visit's note, there were discussions regarding pursuing prior authorization for Invokana, which her insurance previously covered and she tolerated well. I will defer this to her PCP given today's focus on her chief complaint of HA, N/V,  diarrhea, cough.   Plan: - continue Victoza 1.8 mg daily, metformin 1000 mg twice daily - patient is not taking [place] due to poor tolerance - PCP follow-up in ~1 month       Relevant Orders   POC Hbg A1C (Completed)     Other   Choking episode    Patient presents for evaluation following a choking episode 3 days ago on Sunday, 6/12, she was outside gardening and had sudden onset of coughing, difficulty breathing, sensation of choking. She found her daughter who then alerted the patient's husband who called 50. In the meantime, the patient vomited phlegm. By the time EMS arrived, the patient was no longer choking and so EMS left without further evaluation. Prior to this episode, the patient denies being  stung by any insect.  Since that episode, she states she has been very anxious, worrying that she will have another episode and wondering why she had the episode to begin with. Additionally, she reports since the episode she has had a nonproductive cough, nausea, diarrhea, poor PO intake, and feels generally fatigued. States she has a loose BM every time she urinates, but denies abdominal pain or blood in her stool. Additionally, she reports she has had a headache since Friday, 6/10, which initially was responsive to Tylenol but has not been the last couple of days.   She states her husband and daughter, whom she lives with, tested positive for COVID 10 days ago after they all went to a family wedding and developed symptoms of congestion and runny nose. Patient reports she never developed cold-like symptoms and did 3 separate at-home rapid antigen tests on 6/13, 6/10, 6/8 all of which were negative. She denies fevers (has been taking her temperature at home daily), chills. She is currently diaphoretic and when asked about this she states her gynecologist has told her she is likely perimenopausal. She thinks she may currently be having a hot flash.   She has GERD and has been taking omeprazole 40  daily. She also has seasonal allergies/asthma for which she has been taking Singulair and flonase daily. She denies dizziness, lightheadedness, abdominal pain. States she has been able to hydrate with Gatorade and water (3-4 bottles total daily), but has only been able to tolerate cereal for the last few days. She states she has been more anxious since the choking episode. She does not take anxiety medications but does have counseling with Dr. Theodis Shove.   Today she is afebrile (98.4 F), borderline tachycardic P 103 initially, 93 on repeat, and hypertensive with BP 157/96. Exam is significant for mild diaphoresis, facial flushing, clear lungs, no abdominal pain, small (1/2 cm) nodule in R lobe of thyroid without pain.  Assessment/Plan: Patient is presenting with a constellation of symptoms which may or may not have a unifying diagnosis. I suspect the patient may have had an aspiration event which may or may not have been related to her GERD which led to mild pneumonitis causing a dry cough. This theory would not explain her diarrhea, however. Given her known COVID exposure, cough, diarrhea, I will retest her for COVID, though her 3 separate negative home antigen tests make this less likely. Her diaphoresis, increased anxiety, and diarrhea could also be explained by hyperthyroidism, so I will also obtain a TSH. I considered thyroiditis as a possibility which could be a unifying diagnosis, however the patient does not have a painful thyroid. I do palpate a small R-sided thyroid nodule, however. Additionally, given her history of seasonal allergies/asthma, her cough could be secondary to post-nasal drip, though she is adherent to her allergy medications. I do think her underlying anxiety, although not the root of these symptoms, is not helping. I have advised the patient return to clinic in 1 month to discuss her anxiety further as she may ultimately benefit from initiation a daily anxiolytic such as an SSRI.  -  Swabbed patient for COVID today - Advised patient to continue her omeprazole, Singular, Flonase - TSH --> depressed at 0.441, so will add-on free T4 - Recommend Robitussin for cough - Encouraged patient to hydrate - Return precautions given - RTC in 6-month or sooner, if necessary       Other Visit Diagnoses     Cough  Relevant Orders   TSH (Completed)   Novel Coronavirus, NAA ([facility])   Facial sweating       Relevant Orders   TSH (Completed)   Diarrhea, unspecified type       Relevant Orders   TSH (Completed)   T4, Free   Low TSH level       Relevant Orders   T4, Free      Return in about 1 month (around 11/04/2020).   Pt discussed with Dr. Jimmye Norman.  Alexandria Lodge, MD Internal Medicine Resident, PGY-1 [facility] Pager: [phone] 10:54 AM, 10/06/2020

## 2020-10-05 NOTE — Patient Instructions (Addendum)
Theresa Carter,   Thank you for your visit to the [facility] today. It was a pleasure meeting you. Today we discussed the following:  1) Episode of choking, cough, diarrhea - I am testing you for COVID. - I am checking your thyroid hormone level. - Continue taking your reflux medicine and your allergy medicines. - Recommend Robitussin for your cough. - I will call you with the results of these tests.  Please follow-up with Dr. Charleen Kirks in ~1 month.    If you have any questions or concerns, please call our clinic at [phone] between 9am-5pm. Outside of these hours, call [phone] and ask for the internal medicine resident on call. If you feel you are having a medical emergency please call 911.

## 2020-10-06 ENCOUNTER — Encounter: Payer: Self-pay | Admitting: Student

## 2020-10-06 DIAGNOSIS — R0989 Other specified symptoms and signs involving the circulatory and respiratory systems: Secondary | ICD-10-CM | POA: Insufficient documentation

## 2020-10-06 LAB — NOVEL CORONAVIRUS, NAA: SARS-CoV-2, NAA: NOT DETECTED

## 2020-10-06 LAB — SARS-COV-2, NAA 2 DAY TAT

## 2020-10-06 LAB — TSH: TSH: 0.441 u[IU]/mL — ABNORMAL LOW (ref 0.450–4.500)

## 2020-10-06 NOTE — Assessment & Plan Note (Signed)
BP today 157/96. Patient reports she is adherent to her ramipril 20 mg daily. States she has been able to take despite her nausea.   A/P: Given patient's increased anxiety and stress from current constellation of symptoms, I will note make any medication adjustments at this time.  - continue ramipril 20 mg daily - RTC in 6-month for PCP follow-up

## 2020-10-06 NOTE — Assessment & Plan Note (Signed)
Hemoglobin A1c is 7.2% today, down slightly from 7.4% at last visit 4 months ago (06/15/20). She reports she has been taking her metformin 1000 mg twice daily, Victoza 1.8 mg daily, however has still not been able to tolerate [place].   Per review of last visit's note, there were discussions regarding pursuing prior authorization for Invokana, which her insurance previously covered and she tolerated well. I will defer this to her PCP given today's focus on her chief complaint of HA, N/V, diarrhea, cough.   Plan: - continue Victoza 1.8 mg daily, metformin 1000 mg twice daily - patient is not taking [place] due to poor tolerance - PCP follow-up in ~1 month
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
## 2020-10-07 LAB — T4, FREE: Free T4: 1.55 ng/dL (ref 0.82–1.77)

## 2020-10-07 LAB — SPECIMEN STATUS REPORT

## 2020-10-10 ENCOUNTER — Encounter: Payer: PRIVATE HEALTH INSURANCE | Admitting: Internal Medicine

## 2020-10-10 ENCOUNTER — Ambulatory Visit: Payer: Self-pay | Admitting: Behavioral Health

## 2020-10-10 DIAGNOSIS — F419 Anxiety disorder, unspecified: Secondary | ICD-10-CM

## 2020-10-10 DIAGNOSIS — F331 Major depressive disorder, recurrent, moderate: Secondary | ICD-10-CM
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
## 2020-10-12 NOTE — Progress Notes (Signed)
Internal Medicine Clinic Attending  Case discussed with Dr. Darrick Meigs  At the time of the visit.  We reviewed the resident's history and exam and pertinent patient test results.  I agree with the assessment, diagnosis, and plan of care documented in the resident's note. Acute aspiration of saliva (poorly timed swallow and inhalation?) may have also caused her choking episode.  Her symptoms are anticipated to resolve.

## 2020-10-25 ENCOUNTER — Encounter: Payer: Self-pay | Admitting: *Deleted

## 2020-11-02 ENCOUNTER — Ambulatory Visit: Payer: Self-pay | Admitting: Behavioral Health

## 2020-11-02 DIAGNOSIS — L732 Hidradenitis suppurativa: Secondary | ICD-10-CM | POA: Diagnosis not present

## 2020-11-02 DIAGNOSIS — L910 Hypertrophic scar: Secondary | ICD-10-CM | POA: Diagnosis not present

## 2020-11-10 ENCOUNTER — Encounter: Payer: Self-pay | Admitting: Internal Medicine

## 2020-11-10 ENCOUNTER — Other Ambulatory Visit: Payer: Self-pay

## 2020-11-10 ENCOUNTER — Ambulatory Visit (INDEPENDENT_AMBULATORY_CARE_PROVIDER_SITE_OTHER): Payer: Medicare HMO | Admitting: Internal Medicine

## 2020-11-10 VITALS — BP 154/86 | HR 86 | Temp 98.3°F | Ht 65.0 in | Wt 225.9 lb

## 2020-11-10 DIAGNOSIS — L732 Hidradenitis suppurativa: Secondary | ICD-10-CM | POA: Diagnosis not present

## 2020-11-10 DIAGNOSIS — E041 Nontoxic single thyroid nodule: Secondary | ICD-10-CM | POA: Diagnosis not present

## 2020-11-10 DIAGNOSIS — I10 Essential (primary) hypertension: Secondary | ICD-10-CM

## 2020-11-10 DIAGNOSIS — J4540 Moderate persistent asthma, uncomplicated: Secondary | ICD-10-CM

## 2020-11-10 DIAGNOSIS — E119 Type 2 diabetes mellitus without complications: Secondary | ICD-10-CM

## 2020-11-10 MED ORDER — LOSARTAN POTASSIUM-HCTZ 50-12.5 MG PO TABS: 1.0000 | ORAL_TABLET | Freq: Every day | ORAL | 1 refills | Status: DC

## 2020-11-10 MED ORDER — LIRAGLUTIDE 18 MG/3ML ~~LOC~~ SOPN: 2.4000 mg | PEN_INJECTOR | Freq: Every day | SUBCUTANEOUS | 1 refills | Status: DC

## 2020-11-10 MED ORDER — MONTELUKAST SODIUM 10 MG PO TABS: 10.0000 mg | ORAL_TABLET | Freq: Every day | ORAL | 1 refills | Status: DC

## 2020-11-10 MED ORDER — FLUTICASONE PROPIONATE HFA 110 MCG/ACT IN AERO: 1.0000 | INHALATION_SPRAY | Freq: Two times a day (BID) | RESPIRATORY_TRACT | 3 refills | Status: DC

## 2020-11-10 MED ORDER — ALBUTEROL SULFATE HFA 108 (90 BASE) MCG/ACT IN AERS: 2.0000 | INHALATION_SPRAY | Freq: Four times a day (QID) | RESPIRATORY_TRACT | 3 refills | Status: DC | PRN

## 2020-11-10 MED ORDER — METFORMIN HCL 1000 MG PO TABS: 1000.0000 mg | ORAL_TABLET | Freq: Two times a day (BID) | ORAL | 3 refills | Status: DC

## 2020-11-10 MED ORDER — OMEPRAZOLE 40 MG PO CPDR: 40.0000 mg | DELAYED_RELEASE_CAPSULE | Freq: Every day | ORAL | 3 refills | Status: DC

## 2020-11-10 NOTE — Assessment & Plan Note (Signed)
Theresa Carter has noticed increased cough and sputum production over the last several months.  She is not experiencing any wheezing or dyspnea, so she was unaware that these symptoms were consistent with asthma.  Due to this, she has not been using her inhaler more often.  She continues to use her Singulair daily.  She notes her last PFTs were many years ago while still living in [place].  Assessment/plan: Patient had a recent episode of choking that came on suddenly and resolved relatively quickly.  I wonder if this was a bronchospasm.  In addition to patient's worsening cough with sputum production, I wonder if her asthma may becoming uncontrolled.  Currently she is only on a as needed rescue inhaler and Singulair.  Theresa Carter previously had trouble tolerating a dry powder inhaler, so we will need to switch to a metered-dose.  I plan to also order PFTs to reassess her lung function.  - Continue albuterol as needed and Singulair daily - Start Flovent 110 mcg 1 puff twice daily - Referral to pharmacy to assist with obtaining Flovent - PFTs ordered

## 2020-11-10 NOTE — Assessment & Plan Note (Signed)
Lab Results  Component Value Date   HGBA1C 7.2 (A) 10/05/2020   [name] states that she continues to take Victoza 1.8 mg daily as well as metformin 1000 mg twice daily.  She tried starting [place], but was unable to tolerate; she did not mention any specific symptoms.  She notes that she would like to work on weight loss.  She has difficulty with dieting and due to chronic pain is limited in her ability to exercise.  Assessment/plan: Patient's A1c is at goal of ~7%.  Given that she is interested in losing weight, we will uptitrate her Victoza from 1.8 to 2.4 mg.  She understands that this means she would have to inject herself twice to administer that dose.  Will obtain BMP today to assess renal function.  At this time, patient does not have any known microvascular complications.  - Increase Victoza to 2.4 mg daily - Patient will have a 4-week follow-up for hypertension.  At that time, if tolerating 2.4 mg daily, consider increasing to 3 mg daily - 5-month follow-up

## 2020-11-10 NOTE — Assessment & Plan Note (Signed)
Ms. Agrusa states that she continues to follow with the [facility].  She was recently started on Humira and is hopeful to have a good response.  She notes that previously she did have a good response prior to having to stop due to financial constraints.  She notes that her dermatologist was concerned about putting her on spironolactone due to risk of hyperkalemia with ACE inhibitor.  Assessment/plan: - If dermatology feels spironolactone is indicated, we can consider adding on with close monitoring of labs

## 2020-11-10 NOTE — Progress Notes (Signed)
   CC: HTN, T2DM, Asthma  HPI:  Theresa Carter is a 50 y.o. with a PMHx as listed below who presents to the clinic for HTN, T2DM, Asthma.   Please see the Encounters tab for problem-based Assessment & Plan regarding status of patient's acute and chronic conditions.  Past Medical History:  Diagnosis Date   Asthma    Chest pain 07/29/2018   Diabetes mellitus without complication ([place])    Ectopic pregnancy    Hypertension    Review of Systems: Review of Systems  Constitutional:  Negative for chills, fever, malaise/fatigue and weight loss.  Respiratory:  Positive for cough and sputum production. Negative for wheezing.   Cardiovascular:  Negative for chest pain and leg swelling.  Gastrointestinal:  Negative for abdominal pain, nausea and vomiting.  Neurological:  Negative for focal weakness and weakness.   Physical Exam:  Vitals:   11/10/20 1028  BP: (!) 154/86  Pulse: 86  Temp: 98.3 F (36.8 C)  TempSrc: Oral  SpO2: 100%  Weight: 225 lb 14.4 oz (102.5 kg)  Height: 5\' 5"  (1.651 m)   Physical Exam Vitals and nursing note reviewed.  Constitutional:      General: She is not in acute distress.    Appearance: She is obese.  HENT:     Head: Normocephalic and atraumatic.  Eyes:     Extraocular Movements: Extraocular movements intact.     Conjunctiva/sclera: Conjunctivae normal.     Pupils: Pupils are equal, round, and reactive to light.  Neck:     Thyroid: Thyroid mass (small nodule on the R (< 1 cm)) present. No thyroid tenderness.  Cardiovascular:     Rate and Rhythm: Normal rate and regular rhythm.     Heart sounds: No murmur heard. Pulmonary:     Effort: Pulmonary effort is normal. No respiratory distress.     Breath sounds: Normal breath sounds. No wheezing, rhonchi or rales.  Abdominal:     General: Bowel sounds are normal.     Palpations: Abdomen is soft.  Musculoskeletal:     Cervical back: Neck supple. No pain with movement.  Lymphadenopathy:      Cervical:     Right cervical: No superficial or deep cervical adenopathy.    Left cervical: No superficial or deep cervical adenopathy.  Skin:    General: Skin is warm and dry.  Neurological:     General: No focal deficit present.     Mental Status: She is alert and oriented to person, place, and time. Mental status is at baseline.  Psychiatric:        Mood and Affect: Mood normal.        Behavior: Behavior normal.        Thought Content: Thought content normal.        Judgment: Judgment normal.    Assessment & Plan:   See Encounters Tab for problem based charting.  Patient discussed with Dr. Philipp Ovens

## 2020-11-10 NOTE — Assessment & Plan Note (Signed)
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 After choking episode recently, examination revealed a right-sided thyroid nodule. TSH was obtained and low with normal T4 levels concerning for a subclinical hyperthyroid.   - Repeat TSH today - If remains low, plan to order thyroid radionuclide uptake imaging

## 2020-11-10 NOTE — Assessment & Plan Note (Signed)
BP Readings from Last 3 Encounters:  11/10/20 (!) 154/86  10/05/20 (!) 153/97  06/14/20 (!) 145/97   Ms. Mosquera states she is taking Ramipril, 2 tablets daily. She takes 1 in the morning and 1 in the evening.   Assessment/Plan:  Goal BP: < 130/90   Patient remains above goal. Will need an additional agent. Will switch to ARB and combine with HCTZ. Although patient is on maximum Ramipril, will switch to half dose of Losartan since we are starting a new agent as well.   - Discontinue Ramipril  - Start Losartan-HCTZ 50-12.5 mg once daily  - Follow up in 4 weeks for BP recheck

## 2020-11-10 NOTE — Patient Instructions (Signed)
It was nice seeing you today! Thank you for choosing [facility] for your Primary Care.    Today we talked about:   Blood Pressure: We are changing your medication today from Ramipril to Losartan-HCTZ. Only take 1 tablet per day  Diabetes: Your a1c is great! We are increasing Victoza to help with weight loss. Inject 1.8 mg and then 0.6 mg to total 2.4 mg nightly  Asthma: I have added a daily maintenance inhaler. Take 1 puff twice daily I have also ordered lung function tests  Thyroid Nodule: We will recheck your blood work today to decide if you need to have your thyroid scanned.    Follow up in 1 month to see how your blood pressure and asthma are doing  Follow up with me in November for regular check up.

## 2020-11-11 LAB — BMP8+ANION GAP
Anion Gap: 13 mmol/L (ref 10.0–18.0)
BUN/Creatinine Ratio: 15 (ref 9–23)
BUN: 9 mg/dL (ref 6–24)
CO2: 22 mmol/L (ref 20–29)
Calcium: 9.3 mg/dL (ref 8.7–10.2)
Chloride: 101 mmol/L (ref 96–106)
Creatinine, Ser: 0.6 mg/dL (ref 0.57–1.00)
Glucose: 101 mg/dL — ABNORMAL HIGH (ref 65–99)
Potassium: 4.2 mmol/L (ref 3.5–5.2)
Sodium: 136 mmol/L (ref 134–144)
eGFR: 111 mL/min/{1.73_m2} (ref >59)

## 2020-11-11 LAB — TSH: TSH: 0.911 u[IU]/mL (ref 0.450–4.500)

## 2020-11-14 NOTE — Progress Notes (Signed)
Kidney function electrolytes stable.  TSH has normalized; no indication for thyroid scan at this time

## 2020-11-21 ENCOUNTER — Ambulatory Visit: Payer: Medicare HMO | Admitting: Behavioral Health

## 2020-11-21 DIAGNOSIS — F4329 Adjustment disorder with other symptoms: Secondary | ICD-10-CM

## 2020-11-21 DIAGNOSIS — F419 Anxiety disorder, unspecified: Secondary | ICD-10-CM

## 2020-11-21 NOTE — Progress Notes (Signed)
Internal Medicine Clinic Attending  Case discussed with Dr. Basaraba  At the time of the visit.  We reviewed the resident's history and exam and pertinent patient test results.  I agree with the assessment, diagnosis, and plan of care documented in the resident's note.  
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
## 2020-11-22 ENCOUNTER — Telehealth: Payer: Self-pay | Admitting: *Deleted

## 2020-11-22 NOTE — Telephone Encounter (Addendum)
PA for Victoza at 2.4 mg daily.  Unavailable for dispensing per pharmacy.  Patient will need to be changed to [place]   per [facility] Pharmacist.  Message to be sent to Yellow team to consider change.  Sander Nephew, RN 11/22/2020 4:47 PM.

## 2020-11-23 ENCOUNTER — Other Ambulatory Visit: Payer: Self-pay | Admitting: Internal Medicine

## 2020-11-23 ENCOUNTER — Encounter: Payer: Self-pay | Admitting: Internal Medicine

## 2020-11-23 DIAGNOSIS — E119 Type 2 diabetes mellitus without complications: Secondary | ICD-10-CM

## 2020-11-23 MED ORDER — OZEMPIC (1 MG/DOSE) 4 MG/3ML ~~LOC~~ SOPN: 1.0000 mg | PEN_INJECTOR | SUBCUTANEOUS | 2 refills | Status: DC

## 2020-11-23 NOTE — Progress Notes (Signed)
Sent request to send prescription for alternative GLP-1 agonist, Victoza unable to be filled. Patient tolerating Victoza per chart, so plan to start Ozempic at treatment dose.   Diabetes mellitus type II, non insulin dependent (HCC) - Semaglutide, 1 MG/DOSE, (OZEMPIC, 1 MG/DOSE,) 4 MG/3ML SOPN; Inject 1 mg into the skin once a week.  Dispense: 6 mL; Refill: 2

## 2020-11-23 NOTE — Telephone Encounter (Signed)
Prescription sent

## 2020-11-24 MED ORDER — LIRAGLUTIDE 18 MG/3ML ~~LOC~~ SOPN
2.4000 mg | PEN_INJECTOR | Freq: Every day | SUBCUTANEOUS | 1 refills | Status: DC
Start: 2020-11-24 — End: 2020-12-08

## 2020-11-24 NOTE — Telephone Encounter (Signed)
Called patient and her insurance has changed. She was able to pick up Victoza. No longer needs Ozempic recently sent. Discussed with CMA , appreciate her following up with pharmacy and patient.  Plan to continue Victoza 2.4 mg daily

## 2020-11-24 NOTE — Telephone Encounter (Signed)
Call from [facility] [employer] stating that [employer] received PA request for Victoza , but pt is no longer on their plan. Spoke with pt-states she has new insurance as of 10/21/20 and she was able to pick up the Victoza from the pharmacy.  States the Ozempic was more expensive anyway.  Will have MD change rx back to Victoza and CMA will cancel ozempic rx at pharmacy.  No further action needed at this time-pt has Victoza.  Phone call complete.Theresa Carter, Theresa Delauder Cassady8/4/202210:06 AM

## 2020-11-28 ENCOUNTER — Other Ambulatory Visit: Payer: Self-pay | Admitting: Internal Medicine

## 2020-11-29 ENCOUNTER — Other Ambulatory Visit: Payer: Self-pay | Admitting: Internal Medicine

## 2020-11-29 ENCOUNTER — Other Ambulatory Visit: Payer: Self-pay | Admitting: Geriatric Medicine

## 2020-11-29 DIAGNOSIS — Z1231 Encounter for screening mammogram for malignant neoplasm of breast: Secondary | ICD-10-CM

## 2020-11-30 ENCOUNTER — Telehealth: Payer: Self-pay | Admitting: Dietician

## 2020-11-30 DIAGNOSIS — E119 Type 2 diabetes mellitus without complications: Secondary | ICD-10-CM

## 2020-11-30 NOTE — Telephone Encounter (Signed)
Tried calling patient to offer Medical Nutrition Therapy for her diabetes to help with weight loss. No answer.

## 2020-12-01 NOTE — Telephone Encounter (Signed)
Ms. Ibay is interested in Medical Nutrition Therapy for her diabetes to help her with weight loss and meal planning.

## 2020-12-08 ENCOUNTER — Ambulatory Visit (INDEPENDENT_AMBULATORY_CARE_PROVIDER_SITE_OTHER): Payer: Medicare HMO | Admitting: Internal Medicine

## 2020-12-08 VITALS — BP 142/92 | HR 89 | Temp 98.4°F | Wt 219.2 lb

## 2020-12-08 DIAGNOSIS — I10 Essential (primary) hypertension: Secondary | ICD-10-CM

## 2020-12-08 DIAGNOSIS — E119 Type 2 diabetes mellitus without complications: Secondary | ICD-10-CM

## 2020-12-08 MED ORDER — LIRAGLUTIDE 18 MG/3ML ~~LOC~~ SOPN: 3.0000 mg | PEN_INJECTOR | Freq: Every day | SUBCUTANEOUS | 1 refills | Status: DC

## 2020-12-08 MED ORDER — LOSARTAN POTASSIUM-HCTZ 100-25 MG PO TABS: 1.0000 | ORAL_TABLET | Freq: Every day | ORAL | 1 refills | Status: DC

## 2020-12-08 MED ORDER — SEMAGLUTIDE-WEIGHT MANAGEMENT 1.7 MG/0.75ML ~~LOC~~ SOAJ: 1.7000 mg | SUBCUTANEOUS | 1 refills | Status: DC

## 2020-12-08 NOTE — Patient Instructions (Signed)
Thank you for trusting me with your care. To recap, today we discussed the following:   1. Diabetes mellitus type II, non insulin dependent (HCC)  - Semaglutide-Weight Management 1.7 MG/0.75ML SOAJ; Inject 1.7 mg into the skin once a week.  Dispense: 3 mL; Refill: 1  2. Essential hypertension  - BMP8+Anion Gap - losartan-hydrochlorothiazide (HYZAAR) 100-25 MG tablet; Take 1 tablet by mouth daily.  Dispense: 30 tablet; Refill: 1

## 2020-12-08 NOTE — Progress Notes (Signed)
   CC: Type 2 Diabetes and Hypertension  HPI:[name] is a 50 y.o. female who presents for evaluation of Type 2 Diabetes and Hypertension. Please see individual problem based A/P for details.  Past Medical History:  Diagnosis Date   Asthma    Chest pain 07/29/2018   Diabetes mellitus without complication ([place])    Ectopic pregnancy    Hypertension    Review of Systems:   Review of Systems  Constitutional:  Negative for chills and fever.  Gastrointestinal:  Negative for abdominal pain, diarrhea and nausea.  Neurological:  Negative for dizziness and headaches.    Physical Exam: Vitals:   12/08/20 1054  BP: (!) 144/73  Pulse: 92  Temp: 98.4 F (36.9 C)  TempSrc: Oral  SpO2: 99%  Weight: 219 lb 3.2 oz (99.4 kg)   General: NAD , obese, well kept HEENT: Conjunctiva nl , antiicteric sclerae, moist mucous membranes, no exudate or erythema Cardiovascular: Normal rate, regular rhythm.  No murmurs, rubs, or gallops Pulmonary : Equal breath sounds, No wheezes, rales, or rhonchi Abdominal: soft, nontender,  bowel sounds present Ext: No edema in lower extremities, no tenderness to palpation of lower extremities.   Assessment & Plan:   See Encounters Tab for problem based charting.  Patient discussed with Dr. Dareen Piano

## 2020-12-09 LAB — BMP8+ANION GAP
Anion Gap: 15 mmol/L (ref 10.0–18.0)
BUN/Creatinine Ratio: 19 (ref 9–23)
BUN: 14 mg/dL (ref 6–24)
CO2: 25 mmol/L (ref 20–29)
Calcium: 9.2 mg/dL (ref 8.7–10.2)
Chloride: 99 mmol/L (ref 96–106)
Creatinine, Ser: 0.72 mg/dL (ref 0.57–1.00)
Glucose: 95 mg/dL (ref 65–99)
Potassium: 4.1 mmol/L (ref 3.5–5.2)
Sodium: 139 mmol/L (ref 134–144)
eGFR: 103 mL/min/{1.73_m2} (ref >59)

## 2020-12-09 NOTE — Assessment & Plan Note (Signed)
HPI: Patient's BP today is 144/73 with a goal of 125-130/80. The patient endorses adherence to her medication regimen. She denied lightheadedness, weakness, and dizziness on standing. Assessment/Plan: Hypertension  - BMP8+Anion Gap - losartan-hydrochlorothiazide (HYZAAR) 100-25 MG tablet; Take 1 tablet by mouth daily.  Dispense: 30 tablet; Refill: 1

## 2020-12-09 NOTE — Progress Notes (Signed)
Patient called.  Patient aware.  

## 2020-12-09 NOTE — Assessment & Plan Note (Addendum)
  HPI: Hgb A1c 7.2 % at the last visit 2 months ago. She has lost 6 pounds this past month,  current weight 219.The patient is tolerating liraglutide well.   Assessment/Plan: Type 2 Diabetes , controlled, without complications. She is also living with obesity. Will switch liraglutide to semaglutide today which has may provide more weight loss benefit. She is going to finish the liraglutide she has at home at '3mg'$  dose before starting semaglutide. We decided to start Semaglutide at 1.7 mg dose given she is tolerating liraglutide.   - Semaglutide-Weight Management 1.7 MG/0.75ML SOAJ; Inject 1.7 mg into the skin once a week.  Dispense: 3 mL; Refill: 1 - Repeat A1c at next visit

## 2020-12-12 ENCOUNTER — Other Ambulatory Visit: Payer: Self-pay

## 2020-12-12 ENCOUNTER — Ambulatory Visit
Admission: RE | Admit: 2020-12-12 | Discharge: 2020-12-12 | Disposition: A | Discharge status: Home / Self Care | Payer: Medicare HMO | Source: Ambulatory Visit | Attending: *Deleted | Admitting: *Deleted

## 2020-12-12 DIAGNOSIS — Z1231 Encounter for screening mammogram for malignant neoplasm of breast: Secondary | ICD-10-CM

## 2020-12-12 NOTE — Progress Notes (Signed)
Internal Medicine Clinic Attending  Case discussed with Dr. Steen  At the time of the visit.  We reviewed the resident's history and exam and pertinent patient test results.  I agree with the assessment, diagnosis, and plan of care documented in the resident's note.  

## 2020-12-13 ENCOUNTER — Other Ambulatory Visit: Payer: Self-pay | Admitting: Internal Medicine

## 2020-12-14 ENCOUNTER — Telehealth: Payer: Self-pay

## 2020-12-14 ENCOUNTER — Ambulatory Visit: Payer: Medicare HMO | Admitting: Behavioral Health

## 2020-12-14 DIAGNOSIS — F331 Major depressive disorder, recurrent, moderate: Secondary | ICD-10-CM

## 2020-12-14 DIAGNOSIS — F4329 Adjustment disorder with other symptoms: Secondary | ICD-10-CM

## 2020-12-14 DIAGNOSIS — F419 Anxiety disorder, unspecified: Secondary | ICD-10-CM

## 2020-12-14 LAB — SARS CORONAVIRUS 2 (TAT 6-24 HRS): SARS Coronavirus 2: NEGATIVE

## 2020-12-14 NOTE — BH Specialist Note (Signed)
Integrated Behavioral Health via Telemedicine Visit  12/14/2020 Shirika Lebowitz BP:[id number]  Number of Integrated Behavioral Health visits: 11 Session Start time: 2:00pm  Session End time: 2:50pm Total time: 50   Referring Provider: Dr. Jose Persia, MD Patient/Family location: Pt is in car on way home from grocery [facility] Provider location: [facility] Office All persons participating in visit: Pt & Clinician  Types of Service: Individual psychotherapy  I connected with [name] and/or Lucilla Lame Olazabal's  self  via  Telephone or Video Enabled Telemedicine Application  (Video is Caregility application) and verified that I am speaking with the correct person using two identifiers. Discussed confidentiality:  11th visit  I discussed the limitations of telemedicine and the availability of in person appointments.  Discussed there is a possibility of technology failure and discussed alternative modes of communication if that failure occurs.  I discussed that engaging in this telemedicine visit, they consent to the provision of behavioral healthcare and the services will be billed under their insurance.  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 Duration of problem: years Severity of problem: moderate  Patient and/or Family's Strengths/Protective Factors: Social connections, Social and Emotional competence, Concrete supports in place (healthy food, safe environments, etc.), and Sense of purpose  Goals Addressed: Patient will:  Reduce symptoms of: anxiety, depression, and stress   Increase knowledge and/or ability of: coping skills and stress reduction   Demonstrate ability to: Increase healthy adjustment to current life circumstances and Increase  adequate support systems for patient/family  Progress towards Goals: Ongoing  Interventions: Interventions utilized:  Solution-Focused Strategies and Supportive Counseling Standardized Assessments completed:  screeners prn  Patient and/or Family Response: Pt receptive to call today, having concern for her Adult Children. Pt requests future appts.  Assessment: Patient currently experiencing inc in anx/dep & stress due to life transitions across the lifespan & adjustments to past issues w/in the FOO.  Patient may benefit from cont'd ck-in sessions.  Plan: Follow up with behavioral health clinician on : 2-3 wks on telehealth for 60 min Behavioral recommendations: None @ this time Referral(s): [place] (In Clinic)  I discussed the assessment and treatment plan with the patient and/or parent/guardian. They were provided an opportunity to ask questions and all were answered. They agreed with the plan and demonstrated an understanding of the instructions.   They were advised to call back or seek an in-person evaluation if the symptoms worsen or if the condition fails to improve as anticipated.  Donnetta Hutching, LMFT

## 2020-12-14 NOTE — Telephone Encounter (Signed)
Pa was sent through cover my meds for [facility]  on 12/14/20 ...awaiting approval or denial

## 2020-12-15 ENCOUNTER — Ambulatory Visit (HOSPITAL_COMMUNITY)
Admission: RE | Admit: 2020-12-15 | Discharge: 2020-12-15 | Disposition: A | Discharge status: Home / Self Care | Payer: Medicare HMO | Source: Ambulatory Visit | Attending: Internal Medicine | Admitting: Internal Medicine

## 2020-12-15 ENCOUNTER — Other Ambulatory Visit: Payer: Self-pay

## 2020-12-15 DIAGNOSIS — J4540 Moderate persistent asthma, uncomplicated: Secondary | ICD-10-CM | POA: Insufficient documentation

## 2020-12-15 LAB — PULMONARY FUNCTION TEST
DL/VA % pred: 140 %
DL/VA: 6.03 ml/min/mmHg/L
DLCO unc % pred: 116 %
DLCO unc: 25.54 ml/min/mmHg
FEF 25-75 Post: 3.67 L/sec
FEF 25-75 Pre: 3.19 L/sec
FEF2575-%Change-Post: 15 %
FEF2575-%Pred-Post: 141 %
FEF2575-%Pred-Pre: 122 %
FEV1-%Change-Post: 1 %
FEV1-%Pred-Post: 113 %
FEV1-%Pred-Pre: 111 %
FEV1-Post: 2.79 L
FEV1-Pre: 2.75 L
FEV1FVC-%Change-Post: 2 %
FEV1FVC-%Pred-Pre: 105 %
FEV6-%Change-Post: -1 %
FEV6-%Pred-Post: 105 %
FEV6-%Pred-Pre: 106 %
FEV6-Post: 3.14 L
FEV6-Pre: 3.18 L
FEV6FVC-%Pred-Post: 102 %
FEV6FVC-%Pred-Pre: 102 %
FVC-%Change-Post: -1 %
FVC-%Pred-Post: 102 %
FVC-%Pred-Pre: 104 %
FVC-Post: 3.14 L
FVC-Pre: 3.18 L
Post FEV1/FVC ratio: 89 %
Post FEV6/FVC ratio: 100 %
Pre FEV1/FVC ratio: 87 %
Pre FEV6/FVC Ratio: 100 %
RV % pred: 80 %
RV: 1.45 L
TLC % pred: 89 %
TLC: 4.66 L

## 2020-12-15 MED ORDER — ALBUTEROL SULFATE (2.5 MG/3ML) 0.083% IN NEBU
2.5000 mg | INHALATION_SOLUTION | Freq: Once | RESPIRATORY_TRACT | Status: AC
  Administered 2020-12-15: 2.5 mg via RESPIRATORY_TRACT

## 2020-12-19 ENCOUNTER — Encounter: Payer: Self-pay | Admitting: Internal Medicine

## 2020-12-20 ENCOUNTER — Other Ambulatory Visit: Payer: Self-pay | Admitting: Internal Medicine

## 2020-12-20 ENCOUNTER — Other Ambulatory Visit: Payer: Self-pay | Admitting: *Deleted

## 2020-12-20 DIAGNOSIS — R110 Nausea: Secondary | ICD-10-CM

## 2020-12-20 DIAGNOSIS — E119 Type 2 diabetes mellitus without complications: Secondary | ICD-10-CM

## 2020-12-20 MED ORDER — PROMETHAZINE HCL 12.5 MG PO TABS: 12.5000 mg | ORAL_TABLET | Freq: Four times a day (QID) | ORAL | 0 refills | Status: DC | PRN

## 2020-12-20 MED ORDER — SEMAGLUTIDE (1 MG/DOSE) 4 MG/3ML ~~LOC~~ SOPN
1.0000 mg | PEN_INJECTOR | SUBCUTANEOUS | 2 refills | Status: DC
Start: 2020-12-20 — End: 2021-03-13

## 2020-12-20 MED ORDER — FREESTYLE LITE TEST VI STRP
ORAL_STRIP | 12 refills | Status: DC
Start: 2020-12-20 — End: 2020-12-20

## 2020-12-20 MED ORDER — FREESTYLE LANCETS MISC: 12 refills | Status: DC

## 2020-12-20 MED ORDER — FREESTYLE FREEDOM LITE W/DEVICE KIT: 1.0000 [IU] | PACK | Freq: Two times a day (BID) | 0 refills | Status: DC | PRN

## 2020-12-20 MED ORDER — FREESTYLE LITE TEST VI STRP: ORAL_STRIP | 12 refills | Status: DC

## 2020-12-20 NOTE — Progress Notes (Signed)
Orders place for Ozempic. Wegovy not covered.   Patient also mentions she is filling ill ( nausea, vomiting). She had increase in BP medication and she checked her BP at pharmacy which was 0000000 systolic. She also recently increased Victoza to 2.4 mg and I think this is likely what is causing symptoms given nl BP and no sick contacts. She will start taking 1.8 mg dose, which she had no symptoms with. She has Phenergan at home she took to help with symptoms. She mentioned this is from previous surgery and outdated. I will send a new prescription. She also needs a glucometer and I will send a prescription for this.  If patient is not getting better I ask her to call clinic tomorrow.   She will be switching to Ozempic once she has finished her supply of Victoza.

## 2020-12-20 NOTE — Telephone Encounter (Signed)
Patients PA for her Wegovy 1.7 MG auto injectors  was denied .. when I called to request more information from the insurance I was told it is not eligable  for coverage meaning the medication is not  covered under medicare part D and should be change to something else under the preferred list

## 2020-12-20 NOTE — Telephone Encounter (Signed)
Call from Inda Merlin, stated pt has part D Medicare; all testing supplies have to include specific instructions (how often pt tests) in order for her insurance to pay. Thanks

## 2020-12-21 ENCOUNTER — Other Ambulatory Visit: Payer: Self-pay | Admitting: Internal Medicine

## 2020-12-21 ENCOUNTER — Other Ambulatory Visit: Payer: Self-pay | Admitting: *Deleted

## 2020-12-21 MED ORDER — ONETOUCH VERIO W/DEVICE KIT: 1.0000 | PACK | Freq: Every day | 0 refills | Status: AC

## 2020-12-21 MED ORDER — ONETOUCH VERIO VI STRP: ORAL_STRIP | 12 refills | Status: AC

## 2020-12-21 MED ORDER — ONETOUCH DELICA LANCETS 30G MISC: 12 refills | Status: AC

## 2020-12-21 MED ORDER — ONETOUCH VERIO W/DEVICE KIT: PACK | Status: DC

## 2020-12-21 NOTE — Telephone Encounter (Signed)
Call from [facility] for One Touch meter. Meter was ordered as "Print"; please re-send electronically. Thanks

## 2020-12-21 NOTE — Progress Notes (Signed)
Orders placed for one touch meter strips and lancets

## 2020-12-27 ENCOUNTER — Encounter: Payer: Medicare HMO | Admitting: Dietician

## 2020-12-29 ENCOUNTER — Encounter: Payer: Self-pay | Admitting: Dietician

## 2020-12-29 ENCOUNTER — Ambulatory Visit (INDEPENDENT_AMBULATORY_CARE_PROVIDER_SITE_OTHER): Payer: Medicare HMO | Admitting: Dietician

## 2020-12-29 DIAGNOSIS — E119 Type 2 diabetes mellitus without complications: Secondary | ICD-10-CM

## 2020-12-29 NOTE — Progress Notes (Signed)
Medical Nutrition Therapy:  Appt start time: [id number] end time:  1135. Total time: 38 Visit # 1  Assessment:  Primary concerns today: desires weight loss. Diagnosed with diabetes ~ 2009-2010, Had gestational diabetes with her last pregnancy in 2000. States she knows what to do but has a difficult time putting it into action. Has decreased her portions of pasta and made other changes with little change in her weight. Is thinking about walking more. She states that she joined a gym, but does not go.  Preferred Learning Style: No preference indicated  Learning Readiness:says she is ready but having troubles getting going to make changes  ANTHROPOMETRICS: Estimated body mass index is 36.23 kg/m (pended) as calculated from the following:   Height as of this encounter: (P) '5\' 5"'$  (1.651 m).   Weight as of this encounter: (P) 217 lb 11.2 oz (98.7 kg).  WEIGHT HISTORY: Highest: 260# Lowest 208#  Wt Readings from Last 10 Encounters:  12/29/20 (P) 217 lb 11.2 oz (98.7 kg)  12/08/20 219 lb 3.2 oz (99.4 kg)  11/10/20 225 lb 14.4 oz (102.5 kg)  10/05/20 219 lb 8 oz (99.6 kg)  06/14/20 226 lb 8 oz (102.7 kg)  02/23/20 226 lb 3.2 oz (102.6 kg)  11/23/19 228 lb 9.6 oz (103.7 kg)  09/16/19 230 lb 4.8 oz (104.5 kg)  08/11/19 228 lb 3.2 oz (103.5 kg)  12/03/18 210 lb (95.3 kg)    SLEEP:has been having more trouble with her sleep since her spouse started back to work driving a truck long distance and has to b gone for long periods of time. Bother her adult children live with them.  MEDICATIONS: on last week of Victoza at 2.4 dose. She did not tolerate the 3.0 dose she is to change to Ozempic 1 mg next week  BLOOD SUGAR: She feels her diabetes is well controlled.  Lab Results  Component Value Date   HGBA1C 7.2 (A) 10/05/2020   HGBA1C 7.4 (A) 06/14/2020   HGBA1C 7.1 (A) 02/23/2020   HGBA1C 6.7 (A) 09/16/2019     DIETARY INTAKE: Usual eating pattern includes 3 meals and 3 snacks per day. Everyday  foods include fruits, vegetables, olive oil, starches.  Avoided foods include sugar sweetened beverages.  Food Intolerances:none mentioned today; N/V better on lower dose of victoza, Constipation: no Dining Out (times/week): need to assess at future visit 24-hr recall:  B ( 9-10AM): coffee with caramel sweetener, 10 am ~2 cups cookie crisp or fruit loops cereal with 2% milk 5x/wk and 2x/week egg and potatoes with inion & peppers Snk ( AM): 1 cup homemade pico de gallo and 1 cup homemade guac L ( 2 PM): leftover or tomato sandwich with mayo. Lettuce, pickle. Sometimes adds cheese, yesterday 1/2 pack fresh string beans sauteed in garlic and olive oil & butter Snk ( PM): fruit- watermelon, grapes, melon D ( 530-6 PM): 3 street tacos w/ chicken, oil, guac, sour cr, cheese, lettuce Snk ( PM): guac, pico de gallo and 15 tortilla chips, small hershey bar Beverages: no sugar Gatorade, diet ginger ale, tea with splenda propel, or hot tea with milk and sugar  Usual physical activity: activities of daily living  Estimated daily energy needs for weight loss: 1500-1750 cal/day, est needs to maintain 1950 calories   Progress Towards Goal(s):  In progress.   Nutritional Diagnosis:  NB-1.1 Food and nutrition-related knowledge deficit As related to lack of sufficient training on meal planning and diabetes.  As evidenced by hr report and her .  stated readiness and a desire for weight loss and lack of progress despite medication that helps with weight loss.    Intervention:  Nutrition counseling and assessment to help patient self assess her ambivalence, elicit change talk. Action Goal: see patient instructions  Outcome goal: improved knowledge about weight loss Coordination of care: work with DR. Winstead as needed  Teaching Method Utilized: Visual, Auditory,Hands on Handouts given during visit include:protein and diabetes,  After visit summary Barriers to learning/adherence to lifestyle change:  competing  values Demonstrated degree of understanding via:  Teach Back   Monitoring/Evaluation:  Dietary intake, exercise, and body weight in 3 week(s) Theresa Carter, RD 12/29/2020 3:44 PM. .

## 2020-12-29 NOTE — Patient Instructions (Signed)
My goal is to decrease my portion of cereal to 1/2 of what I have been doing until I go shopping.  Then I will change to different cereal  with less added sugar (higher fiber recommendation 3-5 grams fiber).   We talked about needing more protein when losing weight and as we age....   Butch Penny [phone]

## 2021-01-05 ENCOUNTER — Encounter: Payer: Medicare HMO | Admitting: Internal Medicine

## 2021-01-10 ENCOUNTER — Ambulatory Visit: Payer: Medicare HMO | Admitting: Behavioral Health

## 2021-01-11 DIAGNOSIS — T8090XA Unspecified complication following infusion and therapeutic injection, initial encounter: Secondary | ICD-10-CM | POA: Diagnosis not present

## 2021-01-11 DIAGNOSIS — L732 Hidradenitis suppurativa: Secondary | ICD-10-CM | POA: Diagnosis not present

## 2021-01-11 DIAGNOSIS — R21 Rash and other nonspecific skin eruption: Secondary | ICD-10-CM | POA: Diagnosis not present

## 2021-01-11 DIAGNOSIS — L309 Dermatitis, unspecified: Secondary | ICD-10-CM | POA: Diagnosis not present

## 2021-01-11 DIAGNOSIS — Z79899 Other long term (current) drug therapy: Secondary | ICD-10-CM | POA: Diagnosis not present

## 2021-01-12 ENCOUNTER — Encounter: Payer: Medicare HMO | Admitting: Internal Medicine

## 2021-01-18 ENCOUNTER — Ambulatory Visit: Payer: Medicare HMO | Admitting: Dietician

## 2021-01-19 ENCOUNTER — Other Ambulatory Visit: Payer: Self-pay

## 2021-01-19 ENCOUNTER — Ambulatory Visit (INDEPENDENT_AMBULATORY_CARE_PROVIDER_SITE_OTHER): Payer: Medicare HMO | Admitting: Internal Medicine

## 2021-01-19 ENCOUNTER — Ambulatory Visit (INDEPENDENT_AMBULATORY_CARE_PROVIDER_SITE_OTHER): Payer: Medicare HMO | Admitting: Dietician

## 2021-01-19 VITALS — BP 137/88 | Temp 98.1°F | Ht 65.0 in | Wt 208.7 lb

## 2021-01-19 DIAGNOSIS — Z7984 Long term (current) use of oral hypoglycemic drugs: Secondary | ICD-10-CM

## 2021-01-19 DIAGNOSIS — F4329 Adjustment disorder with other symptoms: Secondary | ICD-10-CM | POA: Diagnosis not present

## 2021-01-19 DIAGNOSIS — L732 Hidradenitis suppurativa: Secondary | ICD-10-CM

## 2021-01-19 DIAGNOSIS — E119 Type 2 diabetes mellitus without complications: Secondary | ICD-10-CM

## 2021-01-19 DIAGNOSIS — R69 Illness, unspecified: Secondary | ICD-10-CM | POA: Diagnosis not present

## 2021-01-19 DIAGNOSIS — I10 Essential (primary) hypertension: Secondary | ICD-10-CM | POA: Diagnosis not present

## 2021-01-19 LAB — POCT GLYCOSYLATED HEMOGLOBIN (HGB A1C): Hemoglobin A1C: 7.4 % — AB (ref 4.0–5.6)

## 2021-01-19 LAB — GLUCOSE, CAPILLARY: Glucose-Capillary: 117 mg/dL — ABNORMAL HIGH (ref 70–99)

## 2021-01-19 NOTE — Progress Notes (Signed)
  Medical Nutrition Therapy:  Appt start time: 7282 end time:  1605. Total time: 30 Visit # 2  Assessment:  Primary concerns today: desires weight loss. Tries to weigh daily at home.  She states that her appetite is decreased and this has helped her eat smaller portions and less cereal. She wants to weigh 150# by march 2023. This goal was modified because it requires insufficient calories to meet her nutrient needs.  ANTHROPOMETRICS: Estimated body mass index is 34.73 kg/m as calculated from the following:   Height as of an earlier encounter on 01/19/21: 5\' 5"  (1.651 m).   Weight as of an earlier encounter on 01/19/21: 208 lb 11.2 oz (94.7 kg).  WEIGHT HISTORY: Highest: 260# Lowest 208#  Wt Readings from Last 10 Encounters:  01/19/21 208 lb 11.2 oz (94.7 kg)  12/29/20 (P) 217 lb 11.2 oz (98.7 kg)  12/08/20 219 lb 3.2 oz (99.4 kg)  11/10/20 225 lb 14.4 oz (102.5 kg)  10/05/20 219 lb 8 oz (99.6 kg)  06/14/20 226 lb 8 oz (102.7 kg)  02/23/20 226 lb 3.2 oz (102.6 kg)  11/23/19 228 lb 9.6 oz (103.7 kg)  09/16/19 230 lb 4.8 oz (104.5 kg)  08/11/19 228 lb 3.2 oz (103.5 kg)    MEDICATIONS: Ozempic 1 mg next week  BLOOD SUGAR: Does not check her blood sugars. She feels her diabetes is well controlled.  Lab Results  Component Value Date   HGBA1C 7.4 (A) 01/19/2021   HGBA1C 7.2 (A) 10/05/2020   HGBA1C 7.4 (A) 06/14/2020   HGBA1C 7.1 (A) 02/23/2020   HGBA1C 6.7 (A) 09/16/2019     DIETARY INTAKE: Usual eating pattern includes 3 meals and 3 snacks per day. Everyday foods include fruits, vegetables, olive oil, starches.  .   Dining Out (times/week): not often but recently for her birthday Beverages:2% milk 1-2 cups a day, no sugar Gatorade, diet ginger ale, tea with splenda propel, or hot tea with milk and sugar, coffee from starbucks  Usual physical activity: activities of daily living, minimal by her estimate and not ready to change  Estimated daily energy needs for weight loss:  1500-1750 cal/day, est needs to maintain 1950 calories for her goals per body weight planner: In order to maintain your current weight 2,358   Calories/day To reach her goal of 150 lbs in 272 days:  1,199   Calories/day   Progress Towards Goal(s):  In progress.   Nutritional Diagnosis:  NB-1.1 Food and nutrition-related knowledge deficit As related to lack of sufficient training on meal planning and diabetes.  As evidenced by her report and her .stated readiness and a desire for weight loss and lack of progress despite medication that helps with weight loss.    Intervention:  Nutrition counseling and education about calorie needs, how to estimate, budgeting caloies, keep a food record, importance of activity, weight goals,  Action Goal: see patient instructions  Outcome goal: improved knowledge about weight loss Coordination of care: work with DR. Winstead as needed  Teaching Method Utilized: Visual, Auditory,Hands on Handouts given during visit include:protein and diabetes,  After visit summary Barriers to learning/adherence to lifestyle change:  competing values Demonstrated degree of understanding via:  Teach Back   Monitoring/Evaluation:  Dietary intake, exercise, and body weight in 3 week(s) [name], RD 01/19/2021 4:57 PM. .

## 2021-01-19 NOTE — Patient Instructions (Addendum)
You are doing great!   Body weight planner web address: [email or web address]  In order to maintain  your current weight, you should eat:   2,358   Calories/day  To reach your goal of   150 lbs in 272 days, you should eat:  1200  Calories/day  Recommend keeping records of the food you eat for 3-7 days. Total calories then average for those days should be close to goals above.   Butch Penny [phone]

## 2021-01-19 NOTE — Progress Notes (Signed)
   CC: 1 month f/u new med  HPI:Ms.Raven Furnas is a 50 y.o. female who presents for evaluation of 1 month f/u new med. Please see individual problem based A/P for details.  Please see encounters tab for problem based charting.  Depression, PHQ-9: Based on the patients  [place] Visit from 12/08/2020 in [place]  PHQ-9 Total Score 5      score we have 5.  Past Medical History:  Diagnosis Date   Asthma    Chest pain 07/29/2018   Diabetes mellitus without complication ([place])    Ectopic pregnancy    Hypertension    Review of Systems:   Review of Systems  Constitutional: Negative.   HENT: Negative.    Eyes: Negative.   Respiratory: Negative.    Cardiovascular: Negative.   Gastrointestinal: Negative.   Genitourinary: Negative.   Musculoskeletal: Negative.   Skin: Negative.   Neurological: Negative.   Psychiatric/Behavioral:  Positive for depression.     Physical Exam: Vitals:   01/19/21 1345  BP: 137/88  Temp: 98.1 F (36.7 C)  SpO2: 100%  Weight: 208 lb 11.2 oz (94.7 kg)  Height: 5\' 5"  (1.651 m)     General: alert and oriented. HEENT: Conjunctiva nl , antiicteric sclerae, moist mucous membranes, no exudate or erythema Cardiovascular: Normal rate, regular rhythm.  No murmurs, rubs, or gallops Pulmonary : Equal breath sounds, No wheezes, rales, or rhonchi Abdominal: soft, nontender,  bowel sounds present Ext: No edema in lower extremities, no tenderness to palpation of lower extremities.  Skin: healing incision from bx with negligible amount of unhealed tissue. HS lower abd.   Assessment & Plan:   See Encounters Tab for problem based charting.  Patient seen with Dr.  Saverio Danker

## 2021-01-19 NOTE — Patient Instructions (Addendum)
Dear Theresa Carter,  Thank you for trusting [place] with your care today.  Today we addressed your diabetes as well as your hidradenitis suppurativa.  For your diabetes you were recently started on the Ozempic.  It seems that you are doing well.  We will continue this medication.  We would like to check an A1c today, as well as a lipid panel, and a urine sample to evaluate your kidney function.  We will also place a referral to an ophthalmologist.  For your hidradenitis suppurativa, we removed the sutures.  There was a little bit of open skin noted but it appears to be healing well overall.  If you notice any increased pain redness swelling or drainage, call your dermatologist.  We will also get you connected with Dr. Theodis Shove.  Please return in 3 months for diabetes follow-up.

## 2021-01-20 LAB — LIPID PANEL
Chol/HDL Ratio: 5.5 ratio — ABNORMAL HIGH (ref 0.0–4.4)
Cholesterol, Total: 216 mg/dL — ABNORMAL HIGH (ref 100–199)
HDL: 39 mg/dL — ABNORMAL LOW (ref >39)
LDL Chol Calc (NIH): 123 mg/dL — ABNORMAL HIGH (ref 0–99)
Triglycerides: 305 mg/dL — ABNORMAL HIGH (ref 0–149)
VLDL Cholesterol Cal: 54 mg/dL — ABNORMAL HIGH (ref 5–40)

## 2021-01-29 ENCOUNTER — Encounter: Payer: Self-pay | Admitting: Internal Medicine

## 2021-01-29 NOTE — Assessment & Plan Note (Signed)
Chronic and stable. On metformin and spironolactone. Was started on Humira 40 and noticed pruritic rash in groin and was referred to dermatologist at [facility] for derm biopsy which was done 1 week ago.  Pt had planned to go to dermatologist but due to distance, was told to follow up with PCP for suture removal.  Not had any erythema, edema, drainage, or pain. Sutures were removed in office. Sutures slightly buried,and negligible amount of unhealed tissue exposed. Band-aid placed over for protection.

## 2021-01-29 NOTE — Assessment & Plan Note (Signed)
On metformin 1000mg  BID. Recently started Ozemic 1mg  last OV 1 month ago. Reports she feels fine, does not check cbg at home.  Check a1c, lipid, urine, and new referral to ophtho placed.

## 2021-01-29 NOTE — Assessment & Plan Note (Signed)
Used to follow with Dr. Theodis Shove. Last saw in Aug but has not been able to see since. She states that she thinks visits have been helpful.  Will assist in rescheduling with Dr. Theodis Shove of [facility].

## 2021-01-29 NOTE — Assessment & Plan Note (Signed)
Controlled on Losartan-HCTZ 100-25.  Will continue.

## 2021-01-30 NOTE — Progress Notes (Signed)
Internal Medicine Clinic Attending  I saw and evaluated the patient.  I personally confirmed the key portions of the history and exam documented by Dr. Gawaluck and I reviewed pertinent patient test results.  The assessment, diagnosis, and plan were formulated together and I agree with the documentation in the resident's note.  

## 2021-02-08 ENCOUNTER — Other Ambulatory Visit: Payer: Self-pay | Admitting: Internal Medicine

## 2021-02-08 DIAGNOSIS — I10 Essential (primary) hypertension: Secondary | ICD-10-CM

## 2021-02-09 ENCOUNTER — Ambulatory Visit: Payer: Medicare HMO | Admitting: Dietician

## 2021-02-14 ENCOUNTER — Ambulatory Visit (INDEPENDENT_AMBULATORY_CARE_PROVIDER_SITE_OTHER): Payer: Medicare HMO | Admitting: Dietician

## 2021-02-14 ENCOUNTER — Encounter: Payer: Self-pay | Admitting: Dietician

## 2021-02-14 DIAGNOSIS — E119 Type 2 diabetes mellitus without complications: Secondary | ICD-10-CM

## 2021-02-14 DIAGNOSIS — Z713 Dietary counseling and surveillance: Secondary | ICD-10-CM

## 2021-02-14 NOTE — Patient Instructions (Addendum)
Good Job with sticking with budgeting your calories despite eating out more on your trips!!  Goal for next visit- have more activity- have you started walking?   Try to stick to as close to 1200 calories per day as you can.  Butch Penny [phone]

## 2021-02-14 NOTE — Progress Notes (Signed)
Medical Nutrition Therapy:  Appt start time: 4854 end time:  1130. Total time: 35 Visit # 3  Assessment:  Primary concerns today: desires weight loss. She has lost 11# in past 6 weeks which is ~1-2 # per week overall since working with RDN. However this past time she lost less due to eating out more. She would like to increase the Ozempic to 2 mg if her doctor approves. She self monitors by weighing daily at home.  She states that she is planning to start walking today with her daughter. She had two trips in  the past month that were challenging to her weight loss goals as thy caused more eating away from home and foods she does not usually eat.  Her goal: to weigh 150# by June 2023.  ANTHROPOMETRICS: Estimated body mass index is 34.38 kg/m as calculated from the following:   Height as of 01/19/21: 5\' 5"  (1.651 m).   Weight as of this encounter: 206 lb 9.6 oz (93.7 kg).  WEIGHT HISTORY: Highest: 260# Lowest 208#  Wt Readings from Last 10 Encounters:  02/14/21 206 lb 9.6 oz (93.7 kg)  01/19/21 208 lb 11.2 oz (94.7 kg)  12/29/20 (P) 217 lb 11.2 oz (98.7 kg)  12/08/20 219 lb 3.2 oz (99.4 kg)  11/10/20 225 lb 14.4 oz (102.5 kg)  10/05/20 219 lb 8 oz (99.6 kg)  06/14/20 226 lb 8 oz (102.7 kg)  02/23/20 226 lb 3.2 oz (102.6 kg)  11/23/19 228 lb 9.6 oz (103.7 kg)  09/16/19 230 lb 4.8 oz (104.5 kg)   MEDICATIONS: Ozempic 1 mg next week  BLOOD SUGAR: Does not check her blood sugars. Lab Results  Component Value Date   HGBA1C 7.4 (A) 01/19/2021   HGBA1C 7.2 (A) 10/05/2020   HGBA1C 7.4 (A) 06/14/2020   HGBA1C 7.1 (A) 02/23/2020   HGBA1C 6.7 (A) 09/16/2019    DIETARY INTAKE: Usual eating pattern includes 3 meals and 3 snacks per day. Everyday foods include fruits, vegetables, olive oil, starches.  .   Dining Out (times/week): went to [place] recently Beverages:2% milk 1-2 cups a day, no sugar Gatorade, diet ginger ale, tea with splenda propel, or hot tea with milk and sugar, coffee from  [employer]bucks  Usual physical activity: activities of daily living, minimal by her estimate and ready to change  Estimated daily energy needs for weight loss: 1500-1750 cal/day, est needs to maintain 1950 calories for her goals per body weight planner: In order to maintain your current weight 2,358   Calories/day To reach her goal of 150 lbs in 272 days:  1,199   Calories/day   Progress Towards Goal(s):  In progress.   Nutritional Diagnosis:  NB-1.1 Food and nutrition-related knowledge deficit As related to lack of sufficient training on meal planning and diabetes.  As evidenced by her report and her .stated readiness and a desire for weight loss and lack of progress despite medication that helps with weight loss.    Intervention:  Nutrition counseling and education about calorie needs, how to estimate, budgeting calories, keep a food record, importance of activity, weight goals,  Action Goal: see patient instructions  Outcome goal: improved knowledge about weight loss Coordination of care: weight loss is below goal of 1-2 pounds per week. Consider increased dose of Ozempic per patient  Teaching Method Utilized: Visual, Auditory,Hands on Handouts given during visit include:protein and diabetes,  After visit summary Barriers to learning/adherence to lifestyle change:  competing values Demonstrated degree of understanding via:  Teach  Back   Monitoring/Evaluation:  Dietary intake, exercise, and body weight in 6 week(s) [name], RD 02/14/2021 11:48 AM. .

## 2021-02-15 DIAGNOSIS — L732 Hidradenitis suppurativa: Secondary | ICD-10-CM | POA: Diagnosis not present

## 2021-02-15 DIAGNOSIS — L910 Hypertrophic scar: Secondary | ICD-10-CM | POA: Diagnosis not present

## 2021-03-05 ENCOUNTER — Other Ambulatory Visit: Payer: Self-pay

## 2021-03-05 ENCOUNTER — Encounter (HOSPITAL_COMMUNITY): Payer: Self-pay | Admitting: Emergency Medicine

## 2021-03-05 ENCOUNTER — Emergency Department (HOSPITAL_COMMUNITY)
Admission: EM | Admit: 2021-03-05 | Discharge: 2021-03-06 | Disposition: A | Discharge status: Home / Self Care | Payer: Medicare HMO | Attending: Emergency Medicine | Admitting: Emergency Medicine

## 2021-03-05 DIAGNOSIS — R109 Unspecified abdominal pain: Secondary | ICD-10-CM | POA: Insufficient documentation

## 2021-03-05 DIAGNOSIS — E119 Type 2 diabetes mellitus without complications: Secondary | ICD-10-CM | POA: Diagnosis not present

## 2021-03-05 DIAGNOSIS — R1084 Generalized abdominal pain: Secondary | ICD-10-CM | POA: Insufficient documentation

## 2021-03-05 DIAGNOSIS — I10 Essential (primary) hypertension: Secondary | ICD-10-CM | POA: Insufficient documentation

## 2021-03-05 DIAGNOSIS — Z794 Long term (current) use of insulin: Secondary | ICD-10-CM | POA: Insufficient documentation

## 2021-03-05 DIAGNOSIS — R000 Tachycardia, unspecified: Secondary | ICD-10-CM | POA: Insufficient documentation

## 2021-03-05 DIAGNOSIS — Z79899 Other long term (current) drug therapy: Secondary | ICD-10-CM | POA: Diagnosis not present

## 2021-03-05 DIAGNOSIS — Z5321 Procedure and treatment not carried out due to patient leaving prior to being seen by health care provider: Secondary | ICD-10-CM | POA: Insufficient documentation

## 2021-03-05 DIAGNOSIS — Z87891 Personal history of nicotine dependence: Secondary | ICD-10-CM | POA: Diagnosis not present

## 2021-03-05 DIAGNOSIS — Z96649 Presence of unspecified artificial hip joint: Secondary | ICD-10-CM | POA: Diagnosis not present

## 2021-03-05 DIAGNOSIS — Z7952 Long term (current) use of systemic steroids: Secondary | ICD-10-CM | POA: Insufficient documentation

## 2021-03-05 DIAGNOSIS — R197 Diarrhea, unspecified: Secondary | ICD-10-CM | POA: Insufficient documentation

## 2021-03-05 DIAGNOSIS — Z7984 Long term (current) use of oral hypoglycemic drugs: Secondary | ICD-10-CM | POA: Insufficient documentation

## 2021-03-05 DIAGNOSIS — J45909 Unspecified asthma, uncomplicated: Secondary | ICD-10-CM | POA: Diagnosis not present

## 2021-03-05 DIAGNOSIS — R112 Nausea with vomiting, unspecified: Secondary | ICD-10-CM | POA: Diagnosis not present

## 2021-03-05 LAB — COMPREHENSIVE METABOLIC PANEL
ALT: 40 U/L (ref 0–44)
AST: 34 U/L (ref 15–41)
Albumin: 4.1 g/dL (ref 3.5–5.0)
Alkaline Phosphatase: 69 U/L (ref 38–126)
Anion gap: 17 — ABNORMAL HIGH (ref 5–15)
BUN: 23 mg/dL — ABNORMAL HIGH (ref 6–20)
CO2: 21 mmol/L — ABNORMAL LOW (ref 22–32)
Calcium: 9.2 mg/dL (ref 8.9–10.3)
Chloride: 97 mmol/L — ABNORMAL LOW (ref 98–111)
Creatinine, Ser: 1.68 mg/dL — ABNORMAL HIGH (ref 0.44–1.00)
GFR, Estimated: 37 mL/min — ABNORMAL LOW (ref >60)
Glucose, Bld: 164 mg/dL — ABNORMAL HIGH (ref 70–99)
Potassium: 3 mmol/L — ABNORMAL LOW (ref 3.5–5.1)
Sodium: 135 mmol/L (ref 135–145)
Total Bilirubin: 1.6 mg/dL — ABNORMAL HIGH (ref 0.3–1.2)
Total Protein: 8.1 g/dL (ref 6.5–8.1)

## 2021-03-05 LAB — CBC WITH DIFFERENTIAL/PLATELET
Abs Immature Granulocytes: 0.09 10*3/uL — ABNORMAL HIGH (ref 0.00–0.07)
Basophils Absolute: 0.1 10*3/uL (ref 0.0–0.1)
Basophils Relative: 0 %
Eosinophils Absolute: 0.1 10*3/uL (ref 0.0–0.5)
Eosinophils Relative: 1 %
HCT: 39.1 % (ref 36.0–46.0)
Hemoglobin: 13.1 g/dL (ref 12.0–15.0)
Immature Granulocytes: 1 %
Lymphocytes Relative: 34 %
Lymphs Abs: 4.9 10*3/uL — ABNORMAL HIGH (ref 0.7–4.0)
MCH: 31 pg (ref 26.0–34.0)
MCHC: 33.5 g/dL (ref 30.0–36.0)
MCV: 92.4 fL (ref 80.0–100.0)
Monocytes Absolute: 1 10*3/uL (ref 0.1–1.0)
Monocytes Relative: 7 %
Neutro Abs: 8.3 10*3/uL — ABNORMAL HIGH (ref 1.7–7.7)
Neutrophils Relative %: 57 %
Platelets: 566 10*3/uL — ABNORMAL HIGH (ref 150–400)
RBC: 4.23 MIL/uL (ref 3.87–5.11)
RDW: 12.9 % (ref 11.5–15.5)
WBC: 14.5 10*3/uL — ABNORMAL HIGH (ref 4.0–10.5)
nRBC: 0 % (ref 0.0–0.2)

## 2021-03-05 LAB — LIPASE, BLOOD: Lipase: 33 U/L (ref 11–51)

## 2021-03-05 MED ORDER — ONDANSETRON 4 MG PO TBDP: 4.0000 mg | ORAL_TABLET | Freq: Once | ORAL | Status: DC

## 2021-03-05 NOTE — ED Triage Notes (Signed)
Pt c/o abdominal pain, nausea/vomiting/diarrhea x 3 days. Denies urinary symptoms or fever.

## 2021-03-05 NOTE — ED Provider Notes (Signed)
Emergency Medicine Provider Triage Evaluation Note  Kaileigh Viswanathan , a 50 y.o. female  was evaluated in triage.  Pt complains of nausea, nonbloody nonbilious emesis, diarrhea for the past 2 days.  Patient also complains of right-sided abdominal pain.  She denies any recent sick contacts.  She denies any suspicious food intake.  She denies any fevers at home..  Surgical head surgery includes cholecystectomy and tubal ligation.   Review of Systems  Positive: + abd pain, nausea, vomiting, diarrhea Negative: - fevers, urinary symptoms  Physical Exam  BP (!) 140/95 (BP Location: Right Arm)   Pulse (!) 134   Temp 98.5 F (36.9 C) (Oral)   Resp 18   SpO2 100%  Gen:   Awake, no distress   Resp:  Normal effort  MSK:   Moves extremities without difficulty  Other:  Diffuse abd TTP. Abd soft. No rebound or guarding.  Tachycardic   Medical Decision Making  Medically screening exam initiated at 6:30 PM.  Appropriate orders placed.  Consuelo Suthers was informed that the remainder of the evaluation will be completed by another provider, this initial triage assessment does not replace that evaluation, and the importance of remaining in the ED until their evaluation is complete.     Eustaquio Maize, PA-C 03/05/21 1832    Godfrey Pick, MD 03/06/21 [phone]

## 2021-03-06 ENCOUNTER — Telehealth: Payer: Self-pay | Admitting: Internal Medicine

## 2021-03-06 ENCOUNTER — Emergency Department (HOSPITAL_BASED_OUTPATIENT_CLINIC_OR_DEPARTMENT_OTHER)
Admission: EM | Admit: 2021-03-06 | Discharge: 2021-03-06 | Disposition: A | Discharge status: Home / Self Care | Payer: Medicare HMO | Source: Home / Self Care | Attending: Emergency Medicine | Admitting: Emergency Medicine

## 2021-03-06 ENCOUNTER — Other Ambulatory Visit: Payer: Self-pay

## 2021-03-06 ENCOUNTER — Telehealth: Payer: Self-pay

## 2021-03-06 DIAGNOSIS — Z79899 Other long term (current) drug therapy: Secondary | ICD-10-CM | POA: Insufficient documentation

## 2021-03-06 DIAGNOSIS — R000 Tachycardia, unspecified: Secondary | ICD-10-CM | POA: Diagnosis not present

## 2021-03-06 DIAGNOSIS — Z7984 Long term (current) use of oral hypoglycemic drugs: Secondary | ICD-10-CM | POA: Insufficient documentation

## 2021-03-06 DIAGNOSIS — R197 Diarrhea, unspecified: Secondary | ICD-10-CM | POA: Diagnosis not present

## 2021-03-06 DIAGNOSIS — E119 Type 2 diabetes mellitus without complications: Secondary | ICD-10-CM | POA: Insufficient documentation

## 2021-03-06 DIAGNOSIS — Z794 Long term (current) use of insulin: Secondary | ICD-10-CM | POA: Insufficient documentation

## 2021-03-06 DIAGNOSIS — Z87891 Personal history of nicotine dependence: Secondary | ICD-10-CM | POA: Insufficient documentation

## 2021-03-06 DIAGNOSIS — R1084 Generalized abdominal pain: Secondary | ICD-10-CM | POA: Insufficient documentation

## 2021-03-06 DIAGNOSIS — Z96649 Presence of unspecified artificial hip joint: Secondary | ICD-10-CM | POA: Insufficient documentation

## 2021-03-06 DIAGNOSIS — I10 Essential (primary) hypertension: Secondary | ICD-10-CM | POA: Insufficient documentation

## 2021-03-06 DIAGNOSIS — R112 Nausea with vomiting, unspecified: Secondary | ICD-10-CM

## 2021-03-06 DIAGNOSIS — Z7952 Long term (current) use of systemic steroids: Secondary | ICD-10-CM | POA: Insufficient documentation

## 2021-03-06 DIAGNOSIS — J45909 Unspecified asthma, uncomplicated: Secondary | ICD-10-CM | POA: Insufficient documentation

## 2021-03-06 LAB — URINALYSIS, ROUTINE W REFLEX MICROSCOPIC
Bacteria, UA: NONE SEEN
Bilirubin Urine: NEGATIVE
Glucose, UA: NEGATIVE mg/dL
Ketones, ur: 20 mg/dL — AB
Leukocytes,Ua: NEGATIVE
Nitrite: NEGATIVE
Protein, ur: NEGATIVE mg/dL
Specific Gravity, Urine: 1.015 (ref 1.005–1.030)
pH: 5 (ref 5.0–8.0)

## 2021-03-06 LAB — COMPREHENSIVE METABOLIC PANEL
ALT: 41 U/L (ref 0–44)
AST: 31 U/L (ref 15–41)
Albumin: 4.6 g/dL (ref 3.5–5.0)
Alkaline Phosphatase: 63 U/L (ref 38–126)
Anion gap: 18 — ABNORMAL HIGH (ref 5–15)
BUN: 32 mg/dL — ABNORMAL HIGH (ref 6–20)
CO2: 23 mmol/L (ref 22–32)
Calcium: 9.3 mg/dL (ref 8.9–10.3)
Chloride: 94 mmol/L — ABNORMAL LOW (ref 98–111)
Creatinine, Ser: 1.74 mg/dL — ABNORMAL HIGH (ref 0.44–1.00)
GFR, Estimated: 36 mL/min — ABNORMAL LOW (ref >60)
Glucose, Bld: 147 mg/dL — ABNORMAL HIGH (ref 70–99)
Potassium: 3.2 mmol/L — ABNORMAL LOW (ref 3.5–5.1)
Sodium: 135 mmol/L (ref 135–145)
Total Bilirubin: 1.2 mg/dL (ref 0.3–1.2)
Total Protein: 8.2 g/dL — ABNORMAL HIGH (ref 6.5–8.1)

## 2021-03-06 LAB — CBC WITH DIFFERENTIAL/PLATELET
Abs Immature Granulocytes: 0.1 10*3/uL — ABNORMAL HIGH (ref 0.00–0.07)
Basophils Absolute: 0 10*3/uL (ref 0.0–0.1)
Basophils Relative: 0 %
Eosinophils Absolute: 0 10*3/uL (ref 0.0–0.5)
Eosinophils Relative: 0 %
HCT: 37.3 % (ref 36.0–46.0)
Hemoglobin: 12.9 g/dL (ref 12.0–15.0)
Immature Granulocytes: 1 %
Lymphocytes Relative: 19 %
Lymphs Abs: 3.1 10*3/uL (ref 0.7–4.0)
MCH: 31.1 pg (ref 26.0–34.0)
MCHC: 34.6 g/dL (ref 30.0–36.0)
MCV: 89.9 fL (ref 80.0–100.0)
Monocytes Absolute: 1 10*3/uL (ref 0.1–1.0)
Monocytes Relative: 6 %
Neutro Abs: 12.5 10*3/uL — ABNORMAL HIGH (ref 1.7–7.7)
Neutrophils Relative %: 74 %
Platelets: 509 10*3/uL — ABNORMAL HIGH (ref 150–400)
RBC: 4.15 MIL/uL (ref 3.87–5.11)
RDW: 13 % (ref 11.5–15.5)
WBC: 16.7 10*3/uL — ABNORMAL HIGH (ref 4.0–10.5)
nRBC: 0 % (ref 0.0–0.2)

## 2021-03-06 LAB — MAGNESIUM: Magnesium: 1 mg/dL — ABNORMAL LOW (ref 1.7–2.4)

## 2021-03-06 LAB — LIPASE, BLOOD: Lipase: 21 U/L (ref 11–51)

## 2021-03-06 MED ORDER — ONDANSETRON 4 MG PO TBDP: ORAL_TABLET | ORAL | 0 refills | Status: DC

## 2021-03-06 MED ORDER — ONDANSETRON HCL 4 MG/2ML IJ SOLN
4.0000 mg | Freq: Once | INTRAMUSCULAR | Status: AC
  Administered 2021-03-06: 4 mg via INTRAVENOUS
  Filled 2021-03-06: qty 2

## 2021-03-06 MED ORDER — SODIUM CHLORIDE 0.9 % IV BOLUS
1000.0000 mL | Freq: Once | INTRAVENOUS | Status: AC
  Administered 2021-03-06: 1000 mL via INTRAVENOUS

## 2021-03-06 MED ORDER — MORPHINE SULFATE (PF) 4 MG/ML IV SOLN
4.0000 mg | Freq: Once | INTRAVENOUS | Status: AC
  Administered 2021-03-06: 4 mg via INTRAVENOUS
  Filled 2021-03-06: qty 1

## 2021-03-06 MED ORDER — DICYCLOMINE HCL 20 MG PO TABS: 20.0000 mg | ORAL_TABLET | Freq: Two times a day (BID) | ORAL | 0 refills | Status: DC

## 2021-03-06 MED ORDER — POTASSIUM CHLORIDE CRYS ER 20 MEQ PO TBCR
40.0000 meq | EXTENDED_RELEASE_TABLET | Freq: Once | ORAL | Status: AC
  Administered 2021-03-06: 40 meq via ORAL
  Filled 2021-03-06: qty 2

## 2021-03-06 MED ORDER — MAGNESIUM SULFATE 2 GM/50ML IV SOLN
2.0000 g | Freq: Once | INTRAVENOUS | Status: AC
  Administered 2021-03-06: 2 g via INTRAVENOUS
  Filled 2021-03-06: qty 50

## 2021-03-06 NOTE — ED Provider Notes (Signed)
[facility] DEPT Provider Note   CSN: [id number] Arrival date & time: 03/06/21  9758     History Chief Complaint  Patient presents with   Nausea   Emesis    Theresa Carter is a 50 y.o. female.  50 yo F with a chief complaints of nausea vomiting and diarrhea going on for about 3 days now.  No known sick contacts no suspicious food intake no recent travel.  Diffuse crampy abdominal pain.  No blood in her stool.  She went to the ER yesterday but did not wait to be seen.  Mild AKI was seen on blood work and when she called her family doctor today they encouraged her to come back to the ED to get fluids.  The history is provided by the patient.  Emesis Associated symptoms: abdominal pain and diarrhea   Associated symptoms: no arthralgias, no chills, no fever, no headaches and no myalgias   Illness Severity:  Moderate Onset quality:  Gradual Duration:  3 days Timing:  Constant Progression:  Worsening Chronicity:  New Associated symptoms: abdominal pain, diarrhea, nausea and vomiting   Associated symptoms: no chest pain, no congestion, no fever, no headaches, no myalgias, no rhinorrhea, no shortness of breath and no wheezing       Past Medical History:  Diagnosis Date   Asthma    Chest pain 07/29/2018   Diabetes mellitus without complication [facility])    Ectopic pregnancy    Hypertension     Patient Active Problem List   Diagnosis Date Noted   Thyroid nodule 11/10/2020   Choking episode 10/06/2020   Lumbar back pain 02/23/2020   Stress and adjustment reaction 02/23/2020   Left elbow pain 11/25/2019   Allergies 08/11/2019   Hidradenitis suppurativa 08/11/2019   Essential hypertension 07/29/2018   Asthma 07/29/2018   Diabetes mellitus type II, non insulin dependent ([place]) 07/29/2018   Chronic pain 07/29/2018    Past Surgical History:  Procedure Laterality Date   CHOLECYSTECTOMY     DILATION AND CURETTAGE OF UTERUS     ENDOMETRIAL ABLATION      HIP ARTHROSCOPY     x2   SALPINGECTOMY     TRIGGER FINGER RELEASE     TUBAL LIGATION     WRIST ARTHROSCOPY       OB History   No obstetric history on file.     Family History  Problem Relation Age of Onset   Venous thrombosis Mother    Heart attack Brother    Venous thrombosis Son     Social History   Tobacco Use   Smoking status: Former   Smokeless tobacco: Never   Tobacco comments:    Quit 2001.  Vaping Use   Vaping Use: Never used  Substance Use Topics   Alcohol use: Yes    Comment: RARE   Drug use: Never    Home Medications Prior to Admission medications   Medication Sig Start Date End Date Taking? Authorizing Provider  dicyclomine (BENTYL) 20 MG tablet Take 1 tablet (20 mg total) by mouth 2 (two) times daily. 03/06/21  Yes Deno Etienne, DO  ondansetron (ZOFRAN ODT) 4 MG disintegrating tablet 4mg  ODT q4 hours prn nausea/vomit 03/06/21  Yes Deno Etienne, DO  albuterol (VENTOLIN HFA) 108 (90 Base) MCG/ACT inhaler Inhale 2 puffs into the lungs every 6 (six) hours as needed for wheezing or shortness of breath. 11/10/20   Jose Persia, MD  Blood Glucose Monitoring Suppl (ONETOUCH VERIO) w/Device KIT 1 kit  by Does not apply route daily. Check blood glucose once daily as needed 12/21/20   Madalyn Rob, MD  cyclobenzaprine (FLEXERIL) 10 MG tablet TAKE ONE TABLET BY MOUTH EVERY NIGHT AT BEDTIME 11/30/20   Jose Persia, MD  fluticasone (FLONASE) 50 MCG/ACT nasal spray Place 1-2 sprays into both nostrils daily. 11/23/19 12/23/19  Jose Persia, MD  fluticasone (FLOVENT HFA) 110 MCG/ACT inhaler Inhale 1 puff into the lungs 2 (two) times daily. 11/10/20 11/10/21  Jose Persia, MD  glucose blood (ONETOUCH VERIO) test strip Check blood glucose once daily as needed 12/21/20   Madalyn Rob, MD  Insulin Pen Needle (PEN NEEDLES) 33G X 4 MM MISC Use to inject diabetic medication into the skin 1 time daily 03/03/20   Jose Persia, MD  losartan-hydrochlorothiazide (HYZAAR) 100-25 MG  tablet TAKE ONE TABLET BY MOUTH DAILY 02/08/21   Jose Persia, MD  metFORMIN (GLUCOPHAGE) 1000 MG tablet Take 1 tablet (1,000 mg total) by mouth 2 (two) times daily. 11/10/20   Jose Persia, MD  montelukast (SINGULAIR) 10 MG tablet Take 1 tablet (10 mg total) by mouth daily. 11/10/20   Jose Persia, MD  mupirocin ointment (BACTROBAN) 2 % Apply topically 3 (three) times daily. 03/22/20   Katsadouros, Vasilios, MD  omeprazole (PRILOSEC) 40 MG capsule Take 1 capsule (40 mg total) by mouth daily. 11/10/20   Jose Persia, MD  OneTouch Delica Lancets 54T MISC Check blood sugar once daily as needed 12/21/20   Madalyn Rob, MD  promethazine (PHENERGAN) 12.5 MG tablet Take 1 tablet (12.5 mg total) by mouth every 6 (six) hours as needed for nausea or vomiting. 12/20/20   Madalyn Rob, MD  Semaglutide, 1 MG/DOSE, 4 MG/3ML SOPN Inject 1 mg into the skin once a week. 12/20/20   Madalyn Rob, MD    Allergies    Patient has no known allergies.  Review of Systems   Review of Systems  Constitutional:  Negative for chills and fever.  HENT:  Negative for congestion and rhinorrhea.   Eyes:  Negative for redness and visual disturbance.  Respiratory:  Negative for shortness of breath and wheezing.   Cardiovascular:  Negative for chest pain and palpitations.  Gastrointestinal:  Positive for abdominal pain, diarrhea, nausea and vomiting.  Genitourinary:  Negative for dysuria and urgency.  Musculoskeletal:  Negative for arthralgias and myalgias.  Skin:  Negative for pallor and wound.  Neurological:  Negative for dizziness and headaches.   Physical Exam Updated Vital Signs BP (!) 105/46 (BP Location: Left Arm)   Pulse 99   Temp 98.5 F (36.9 C) (Oral)   Resp (!) 24   Ht $R'5\' 5"'xb$  (1.651 m)   Wt 87.1 kg   SpO2 99%   BMI 31.95 kg/m   Physical Exam Vitals and nursing note reviewed.  Constitutional:      General: She is not in acute distress.    Appearance: She is well-developed. She is not  diaphoretic.  HENT:     Head: Normocephalic and atraumatic.  Eyes:     Pupils: Pupils are equal, round, and reactive to light.  Cardiovascular:     Rate and Rhythm: Normal rate and regular rhythm.     Heart sounds: No murmur heard.   No friction rub. No gallop.  Pulmonary:     Effort: Pulmonary effort is normal.     Breath sounds: No wheezing or rales.  Abdominal:     General: There is no distension.     Palpations: Abdomen is soft.  Tenderness: There is abdominal tenderness.     Comments: Mild diffuse  Musculoskeletal:        General: No tenderness.     Cervical back: Normal range of motion and neck supple.  Skin:    General: Skin is warm and dry.  Neurological:     Mental Status: She is alert and oriented to person, place, and time.  Psychiatric:        Behavior: Behavior normal.    ED Results / Procedures / Treatments   Labs (all labs ordered are listed, but only abnormal results are displayed) Labs Reviewed  CBC WITH DIFFERENTIAL/PLATELET - Abnormal; Notable for the following components:      Result Value   WBC 16.7 (*)    Platelets 509 (*)    Neutro Abs 12.5 (*)    Abs Immature Granulocytes 0.10 (*)    All other components within normal limits  COMPREHENSIVE METABOLIC PANEL - Abnormal; Notable for the following components:   Potassium 3.2 (*)    Chloride 94 (*)    Glucose, Bld 147 (*)    BUN 32 (*)    Creatinine, Ser 1.74 (*)    Total Protein 8.2 (*)    GFR, Estimated 36 (*)    Anion gap 18 (*)    All other components within normal limits  MAGNESIUM - Abnormal; Notable for the following components:   Magnesium 1.0 (*)    All other components within normal limits  LIPASE, BLOOD    EKG EKG Interpretation  Date/Time:  Monday March 06 2021 10:04:01 EST Ventricular Rate:  121 PR Interval:  143 QRS Duration: 105 QT Interval:  325 QTC Calculation: 462 R Axis:   88 Text Interpretation: Sinus tachycardia Multiple ventricular premature complexes  Nonspecific T abnormalities, inferior leads No significant change since last tracing Confirmed by Deno Etienne [phone]) on 03/06/2021 12:06:58 PM  Radiology No results found.  Procedures Procedures   Medications Ordered in ED Medications  sodium chloride 0.9 % bolus 1,000 mL (1,000 mLs Intravenous New Bag/Given 03/06/21 1302)  morphine 4 MG/ML injection 4 mg (4 mg Intravenous Given 03/06/21 1327)  ondansetron (ZOFRAN) injection 4 mg (4 mg Intravenous Given 03/06/21 1328)  magnesium sulfate IVPB 2 g 50 mL (2 g Intravenous New Bag/Given 03/06/21 1349)  potassium chloride SA (KLOR-CON) CR tablet 40 mEq (40 mEq Oral Given 03/06/21 1344)  sodium chloride 0.9 % bolus 1,000 mL (1,000 mLs Intravenous New Bag/Given 03/06/21 1325)    ED Course  I have reviewed the triage vital signs and the nursing notes.  Pertinent labs & imaging results that were available during my care of the patient were reviewed by me and considered in my medical decision making (see chart for details).    MDM Rules/Calculators/A&P                           50 yo F with a chief complaints of nausea vomiting and diarrhea going on for 3 days now.  Most likely viral syndrome patient is tachycardic here and had an AKI on lab work done yesterday.  We will give a bolus IV fluids pain and nausea medicine reassess.   Patient's renal function is somewhat worse than yesterday.  Given 2 L of IV fluids.  Patient feeling much better after symptomatic therapy.  Tolerating by mouth now.  We will have the patient follow-up with her family doctor in the office for repeat blood work hopefully within a week.  Patient's magnesium was also low and was replenished.  Potassium was low and she was able to tolerate a dose of K-Dur.  3:22 PM:  I have discussed the diagnosis/risks/treatment options with the patient and family and believe the pt to be eligible for discharge home to follow-up with PCP. We also discussed returning to the ED immediately  if new or worsening sx occur. We discussed the sx which are most concerning (e.g., sudden worsening pain, fever, inability to tolerate by mouth) that necessitate immediate return. Medications administered to the patient during their visit and any new prescriptions provided to the patient are listed below.  Medications given during this visit Medications  sodium chloride 0.9 % bolus 1,000 mL (1,000 mLs Intravenous New Bag/Given 03/06/21 1302)  morphine 4 MG/ML injection 4 mg (4 mg Intravenous Given 03/06/21 1327)  ondansetron (ZOFRAN) injection 4 mg (4 mg Intravenous Given 03/06/21 1328)  magnesium sulfate IVPB 2 g 50 mL (2 g Intravenous New Bag/Given 03/06/21 1349)  potassium chloride SA (KLOR-CON) CR tablet 40 mEq (40 mEq Oral Given 03/06/21 1344)  sodium chloride 0.9 % bolus 1,000 mL (1,000 mLs Intravenous New Bag/Given 03/06/21 1325)     The patient appears reasonably screen and/or stabilized for discharge and I doubt any other medical condition or other [facility] requiring further screening, evaluation, or treatment in the ED at this time prior to discharge.   Final Clinical Impression(s) / ED Diagnoses Final diagnoses:  Nausea vomiting and diarrhea    Rx / DC Orders ED Discharge Orders          Ordered    ondansetron (ZOFRAN ODT) 4 MG disintegrating tablet        03/06/21 1509    dicyclomine (BENTYL) 20 MG tablet  2 times daily        03/06/21 [place], Margree Gimbel, DO 03/06/21 1522

## 2021-03-06 NOTE — ED Triage Notes (Signed)
Pt arrives POV with her daughter with c/o of nausea/vomiting/diarrhea x3 days.  Denies fever or urinary symptoms.  Was seen at [facility] ED yesterday, pt states blood work was done but she was not treated.  Pt's PCP called her this am and advised blood work was abnormal and to return to the ED.  Pt also endorses purple splotches on her lower back starting yesterday am.

## 2021-03-06 NOTE — Telephone Encounter (Signed)
Theresa Carter contacted the clinic regarding persistent N/V with inability to hold PO fluids. I reviewed her results from the ED on 11/13 PM that demonstrated leukocytosis with an AKI and AGMA. Theresa Carter left without treatment after waiting 10 hours to be seen.   I reviewed her lab results with her and expressed my concern that she requires IV fluids and additional work up. Theresa Carter stated she just called the [place] ED and is going to be on her way there shortly.

## 2021-03-06 NOTE — ED Notes (Signed)
Patient left on own accord °

## 2021-03-06 NOTE — Telephone Encounter (Signed)
Received TC from patient who c/o vomiting w/ diarrhea since Friday, she c/o of weakness and dizziness and states she "almost passed out twice yesterday".  She states she "hasn't been able to hold water down without vomiting since Friday".  Pt denies SOB , denies any fevers.  She went to the ED yesterday, but [facility] d/t number of patients waiting in the ED.  Pt informed she needs to be evaluated and possibly treated with IV fluids for dehydration.  She was instructed to go to urgent care if she didn't want to return to ED. She verbalized understanding. SChaplin, RN,BSN

## 2021-03-06 NOTE — Discharge Instructions (Signed)
Try Imodium for diarrhea. Follow up with  your doctor in a week for recheck of your labs.  Return to the ED for worsening pain, fever, inability to eat or drink.

## 2021-03-06 NOTE — ED Notes (Signed)
Patient verbalizes understanding of discharge instructions. Opportunity for questioning and answers were provided. Patient discharged from ED.  °

## 2021-03-07 ENCOUNTER — Encounter: Payer: Medicare HMO | Admitting: Internal Medicine

## 2021-03-08 ENCOUNTER — Encounter: Payer: Self-pay | Admitting: Internal Medicine

## 2021-03-09 ENCOUNTER — Encounter: Payer: Self-pay | Admitting: Internal Medicine

## 2021-03-09 ENCOUNTER — Telehealth: Payer: Self-pay

## 2021-03-09 ENCOUNTER — Encounter (HOSPITAL_COMMUNITY): Payer: Self-pay | Admitting: Internal Medicine

## 2021-03-09 ENCOUNTER — Ambulatory Visit (INDEPENDENT_AMBULATORY_CARE_PROVIDER_SITE_OTHER): Payer: Medicare HMO | Admitting: Internal Medicine

## 2021-03-09 ENCOUNTER — Other Ambulatory Visit: Payer: Self-pay

## 2021-03-09 ENCOUNTER — Inpatient Hospital Stay (HOSPITAL_COMMUNITY)
Admission: AD | Admit: 2021-03-09 | Discharge: 2021-03-12 | DRG: 683 | Disposition: A | Discharge status: Home / Self Care | Payer: Medicare HMO | Source: Ambulatory Visit | Attending: Internal Medicine | Admitting: Internal Medicine

## 2021-03-09 VITALS — BP 158/107 | HR 123 | Temp 98.4°F | Ht 65.0 in | Wt 194.3 lb

## 2021-03-09 DIAGNOSIS — N179 Acute kidney failure, unspecified: Principal | ICD-10-CM | POA: Diagnosis present

## 2021-03-09 DIAGNOSIS — L732 Hidradenitis suppurativa: Secondary | ICD-10-CM | POA: Diagnosis present

## 2021-03-09 DIAGNOSIS — D1771 Benign lipomatous neoplasm of kidney: Secondary | ICD-10-CM | POA: Diagnosis present

## 2021-03-09 DIAGNOSIS — Z7985 Long-term (current) use of injectable non-insulin antidiabetic drugs: Secondary | ICD-10-CM

## 2021-03-09 DIAGNOSIS — E871 Hypo-osmolality and hyponatremia: Secondary | ICD-10-CM | POA: Diagnosis present

## 2021-03-09 DIAGNOSIS — E119 Type 2 diabetes mellitus without complications: Secondary | ICD-10-CM | POA: Diagnosis present

## 2021-03-09 DIAGNOSIS — K760 Fatty (change of) liver, not elsewhere classified: Secondary | ICD-10-CM | POA: Diagnosis present

## 2021-03-09 DIAGNOSIS — E860 Dehydration: Secondary | ICD-10-CM | POA: Diagnosis present

## 2021-03-09 DIAGNOSIS — K529 Noninfective gastroenteritis and colitis, unspecified: Secondary | ICD-10-CM | POA: Diagnosis present

## 2021-03-09 DIAGNOSIS — Z7984 Long term (current) use of oral hypoglycemic drugs: Secondary | ICD-10-CM

## 2021-03-09 DIAGNOSIS — E876 Hypokalemia: Secondary | ICD-10-CM | POA: Diagnosis present

## 2021-03-09 DIAGNOSIS — R197 Diarrhea, unspecified: Secondary | ICD-10-CM | POA: Insufficient documentation

## 2021-03-09 DIAGNOSIS — E669 Obesity, unspecified: Secondary | ICD-10-CM | POA: Diagnosis present

## 2021-03-09 DIAGNOSIS — Z803 Family history of malignant neoplasm of breast: Secondary | ICD-10-CM

## 2021-03-09 DIAGNOSIS — I10 Essential (primary) hypertension: Secondary | ICD-10-CM | POA: Diagnosis present

## 2021-03-09 DIAGNOSIS — K219 Gastro-esophageal reflux disease without esophagitis: Secondary | ICD-10-CM | POA: Diagnosis present

## 2021-03-09 DIAGNOSIS — Z801 Family history of malignant neoplasm of trachea, bronchus and lung: Secondary | ICD-10-CM

## 2021-03-09 DIAGNOSIS — Z6832 Body mass index (BMI) 32.0-32.9, adult: Secondary | ICD-10-CM

## 2021-03-09 DIAGNOSIS — Z20822 Contact with and (suspected) exposure to covid-19: Secondary | ICD-10-CM | POA: Diagnosis present

## 2021-03-09 DIAGNOSIS — Z79899 Other long term (current) drug therapy: Secondary | ICD-10-CM

## 2021-03-09 DIAGNOSIS — R160 Hepatomegaly, not elsewhere classified: Secondary | ICD-10-CM | POA: Diagnosis present

## 2021-03-09 DIAGNOSIS — Z823 Family history of stroke: Secondary | ICD-10-CM

## 2021-03-09 DIAGNOSIS — J45909 Unspecified asthma, uncomplicated: Secondary | ICD-10-CM | POA: Diagnosis present

## 2021-03-09 DIAGNOSIS — Z8249 Family history of ischemic heart disease and other diseases of the circulatory system: Secondary | ICD-10-CM

## 2021-03-09 LAB — BASIC METABOLIC PANEL
Anion gap: 15 (ref 5–15)
BUN: 19 mg/dL (ref 6–20)
CO2: 23 mmol/L (ref 22–32)
Calcium: 8.1 mg/dL — ABNORMAL LOW (ref 8.9–10.3)
Chloride: 94 mmol/L — ABNORMAL LOW (ref 98–111)
Creatinine, Ser: 1.39 mg/dL — ABNORMAL HIGH (ref 0.44–1.00)
GFR, Estimated: 47 mL/min — ABNORMAL LOW (ref >60)
Glucose, Bld: 170 mg/dL — ABNORMAL HIGH (ref 70–99)
Potassium: 3 mmol/L — ABNORMAL LOW (ref 3.5–5.1)
Sodium: 132 mmol/L — ABNORMAL LOW (ref 135–145)

## 2021-03-09 LAB — CBC
HCT: 36.9 % (ref 36.0–46.0)
Hemoglobin: 12.5 g/dL (ref 12.0–15.0)
MCH: 31.3 pg (ref 26.0–34.0)
MCHC: 33.9 g/dL (ref 30.0–36.0)
MCV: 92.3 fL (ref 80.0–100.0)
Platelets: 510 10*3/uL — ABNORMAL HIGH (ref 150–400)
RBC: 4 MIL/uL (ref 3.87–5.11)
RDW: 12.8 % (ref 11.5–15.5)
WBC: 13.2 10*3/uL — ABNORMAL HIGH (ref 4.0–10.5)
nRBC: 0 % (ref 0.0–0.2)

## 2021-03-09 LAB — MAGNESIUM: Magnesium: 1.1 mg/dL — ABNORMAL LOW (ref 1.7–2.4)

## 2021-03-09 MED ORDER — ONDANSETRON HCL 4 MG PO TABS: 4.0000 mg | ORAL_TABLET | Freq: Four times a day (QID) | ORAL | Status: DC | PRN

## 2021-03-09 MED ORDER — POTASSIUM CHLORIDE 10 MEQ/100ML IV SOLN
10.0000 meq | INTRAVENOUS | Status: AC
  Administered 2021-03-10 (×3): 10 meq via INTRAVENOUS
  Filled 2021-03-09 (×2): qty 100

## 2021-03-09 MED ORDER — LACTATED RINGERS IV BOLUS
1000.0000 mL | Freq: Once | INTRAVENOUS | Status: AC
  Administered 2021-03-10: 1000 mL via INTRAVENOUS

## 2021-03-09 MED ORDER — MAGNESIUM SULFATE 4 GM/100ML IV SOLN
4.0000 g | Freq: Once | INTRAVENOUS | Status: AC
  Administered 2021-03-10: 4 g via INTRAVENOUS
  Filled 2021-03-09: qty 100

## 2021-03-09 MED ORDER — ACETAMINOPHEN 650 MG RE SUPP: 650.0000 mg | Freq: Four times a day (QID) | RECTAL | Status: DC | PRN

## 2021-03-09 MED ORDER — LACTATED RINGERS IV SOLN: INTRAVENOUS | Status: AC

## 2021-03-09 MED ORDER — ACETAMINOPHEN 325 MG PO TABS
650.0000 mg | ORAL_TABLET | Freq: Four times a day (QID) | ORAL | Status: DC | PRN
  Administered 2021-03-09: 650 mg via ORAL
  Filled 2021-03-09: qty 2

## 2021-03-09 MED ORDER — SODIUM CHLORIDE 0.9% FLUSH
3.0000 mL | Freq: Two times a day (BID) | INTRAVENOUS | Status: DC
  Administered 2021-03-10 – 2021-03-12 (×5): 3 mL via INTRAVENOUS

## 2021-03-09 MED ORDER — ONDANSETRON HCL 4 MG/2ML IJ SOLN
4.0000 mg | Freq: Four times a day (QID) | INTRAMUSCULAR | Status: DC | PRN
  Administered 2021-03-10 – 2021-03-12 (×8): 4 mg via INTRAVENOUS
  Filled 2021-03-09 (×8): qty 2

## 2021-03-09 MED ORDER — ENOXAPARIN SODIUM 40 MG/0.4ML IJ SOSY
40.0000 mg | PREFILLED_SYRINGE | INTRAMUSCULAR | Status: DC
  Administered 2021-03-10 – 2021-03-12 (×3): 40 mg via SUBCUTANEOUS
  Filled 2021-03-09 (×3): qty 0.4

## 2021-03-09 NOTE — Assessment & Plan Note (Signed)
Theresa Carter states she states her symptoms started on November 11th with just generalized malaise and nausea. It further progressed to vomiting (non-bilious, non-bloody) with diarrhea (non-melanotic, non-bloody) with epigastric abdominal pain. Theresa Carter describes her diarrhea as initially water-y but became less water-y throughout the days and began to contain mucus. No greasy appearing stools. She denies any fever, chills, however she endorses significant dizziness, generalized weakness and chest pressure.   She went to the ED and left prior to examination after waiting 12 hours on 03/05/2021. After our discussion on 03/06/2021, patient went back to the ED in [place]. Results showed worsening leukocytosis and AKI. Theresa Carter received IV fluids, Zofran, IV Magnesium, and Morphine. She was discharged shortly after.   Today, Theresa Carter states her vomiting has improved but she continues to be have 7-8 episodes of diarrhea per day. She continues to have nausea with difficulty holding down PO intake. Theresa Carter endorses dizziness persistently that worsens with position changes and when showering.   Assessment/Plan:  Differential includes gastritis versus viral gastroenteritis versus colitis. Given her immunocompromised status (due to Humira), I am concern for opportunistic colitis. Given her chronic Doxycycline use, she is at risk for C. Diff. Today, CBC shows improvement in leukocytosis and BMP shows improvement in AKI, however persistent hypokalemia and hypomagnesemia. Patient is quite symptomatic overall with sustaining HR in the mid 120s. We will plan to admit for IVF and further evaluation with CT imaging and repeat blood work.   - Admit to telemetry floor for observation - IVF with LR bolus followed by LR infusion - IV Zofran PRN - CT Abdomen/Pelvis W/WO contrast - GI pathogen panel - C. Diff testing

## 2021-03-09 NOTE — Progress Notes (Addendum)
IV team on unit to assess for IV placement. Unable to place IV, Carter-MD on unit and made aware and recommends midline if possible. Per IV team nurse she will have another nurse place tonight, as she is leaving at this time.

## 2021-03-09 NOTE — Progress Notes (Signed)
   CC: N/V/D, abdominal pain  HPI:  Theresa Carter is a 50 y.o. with a PMHx as listed below who presents to the clinic for N/V/D, abdominal pain.   Please see the Encounters tab for problem-based Assessment & Plan regarding status of patient's acute and chronic conditions.  Past Medical History:  Diagnosis Date   Asthma    Chest pain 07/29/2018   Diabetes mellitus without complication ([place])    Ectopic pregnancy    Hypertension    Review of Systems: Review of Systems  Constitutional:  Positive for malaise/fatigue. Negative for chills, fever and weight loss.  HENT:  Negative for congestion, sinus pain and sore throat.   Respiratory:  Negative for cough, shortness of breath and wheezing.   Cardiovascular:  Positive for chest pain and palpitations. Negative for leg swelling.  Gastrointestinal:  Positive for abdominal pain, diarrhea, nausea and vomiting. Negative for blood in stool, constipation and melena.  Genitourinary:  Negative for dysuria, flank pain, frequency, hematuria and urgency.  Skin:  Negative for itching and rash.       + rash on her back  Neurological:  Positive for dizziness, weakness and headaches. Negative for focal weakness, seizures and loss of consciousness.   Physical Exam:  There were no vitals filed for this visit. Physical Exam Vitals and nursing note reviewed.  Constitutional:      General: She is not in acute distress.    Appearance: She is obese.  HENT:     Head: Normocephalic and atraumatic.     Mouth/Throat:     Mouth: Mucous membranes are dry.     Pharynx: Oropharynx is clear.  Eyes:     Extraocular Movements: Extraocular movements intact.     Conjunctiva/sclera: Conjunctivae normal.     Pupils: Pupils are equal, round, and reactive to light.  Cardiovascular:     Rate and Rhythm: Regular rhythm. Tachycardia present.     Heart sounds: No murmur heard. Pulmonary:     Effort: Pulmonary effort is normal. No respiratory distress.     Breath  sounds: Normal breath sounds. No wheezing, rhonchi or rales.  Abdominal:     General: Bowel sounds are decreased. There is no distension.     Palpations: Abdomen is soft.     Tenderness: There is abdominal tenderness in the right upper quadrant, epigastric area and periumbilical area. There is guarding. There is no rebound.  Musculoskeletal:        General: No swelling or deformity.  Skin:    General: Skin is warm and dry.     Comments: Hyperpigmented rash on bilateral lower back. No papules or macules.  Neurological:     General: No focal deficit present.     Mental Status: She is alert and oriented to person, place, and time.  Psychiatric:        Mood and Affect: Mood normal.    Assessment & Plan:   See Encounters Tab for problem based charting.  Patient seen with Dr. Saverio Danker

## 2021-03-09 NOTE — Telephone Encounter (Signed)
Requesting to speak with Dr. Charleen Kirks about test results. Please call pt back.

## 2021-03-09 NOTE — Hospital Course (Addendum)
Nothing that she ate.   Woke up early Friday morning had vomiting and abdominal pain.   Diarrhea started after.    ER Sunday could not be admitted went home at 0300 because could not wait any longer. Told to go to ER Monday due to abnormal labs. Got IVF mag and K.    Nauseous but no vomiting after ED visit.   Tuesday Nauseous vomited and diarrhea  Dry heaving currently persistent diarrhea.   Persistent dull abodminal pain, gets intermittent sharp pains that are self limiting. Pain in epigastrium. Has some crampy pain in abdominal pain before defecation. Pain not related to eating.   Non radiating. Pain. Nothing helps with pain has tried APAP. Pain usually 8/10.   Non bloody diarrhea, in the beginning watery, now more solid.  Today 8 or 9 bowel movements.   Poor appetite. Gets nauseous when eats. No sick contacts.   No fevers,   Diagnosed with unsepcified colitis years ago. Never had follow up regarding this.  Defecation does not help with abdominal pain.   Blurry vz, dizzy, HA, fatigued, chest tightness (oressure yesterday and today in the middle) hurts when she presses on sternum,   Can't sit up because gets nauseeous and dizzy, Has calf pain,   Has rash on back first noticed on Sunday, does not hurt or itch.    PMH:  T2DM Asthma HS GERD  PSH: Chole Tubal ligation  Emergency c section   Medications: Losartan HCTZ Metformin  Doxycycline  Finasteride Singulair Ozempic Humira  Omeprazole 40mg  once a day   Yesterday looks like mucus.  No recent travel.  Lives with husband and kids.  No farm animals.   Has a yorkie.    FH:   SH: Mom breast cancer Dad lung cancer, brain aneurysms Eldest brother MI Brother Stroke  Son multiple blood clots (antiphospholipid syndrome bechets disease) Mom gets blood clots   Non smoker rarely drinks alcohol.   Allergies:

## 2021-03-09 NOTE — Telephone Encounter (Signed)
Received following message from patient via mychart:  "Hi Dr Charleen Kirks. I have an appointment with you Monday afternoon, but I'm not sure I should wait till then. I'm still not feeling good. I'm not vomiting any more but I still have diarrhea and I'm not able to eat much at all. I feel very weak. It takes a lot to just take a shower. I've never felt this bad before so it's worrying me. Also sometimes I feel some pressure in my chest. I looked at my bloodwork but I don't really know what it means. I just see a lot of flags. Please let me know what I should do. Thank you "   TC to patient, she was advised to go to the ED, especially if she was experiencing chest pressure.  She states she was feeling chest pressure yesterday, not today and that an EKG was performed at last EKG visit.  RN informed patient that EKG's can change.  Pt wants to speak to PCP.  Rn reiterated to patient to present to ED if she feels any chest pressure/pain.  Forwarding to PCP. SChaplin, RN,BSN

## 2021-03-09 NOTE — Patient Instructions (Signed)
It was nice seeing you today! Thank you for choosing [facility] for your Primary Care.    Today we talked about:   We will be admitting you to the hospital. You will receive a call when your bed is ready. Please make sure to keep your phone near you and on loud.  Due to the dizziness, make sure someone is with you at all times. Do not take any showers.   Work on drinking water and Gatorade. Take tiny sips every 10-15 minutes.   Continue to take the Zofran as needed. You can take it every 6 hours.

## 2021-03-09 NOTE — H&P (Addendum)
Date: 03/10/2021               Patient Name:  Theresa Carter MRN: [id number]  DOB: 11-12-1970 Age / Sex: 50 y.o., female   PCP: Jose Persia, MD         Medical Service: Internal Medicine Teaching Service         Attending Physician: Dr. Velna Ochs, MD    First Contact: Dr. Alvie Heidelberg Pager: [phone]  Second Contact: Dr. Alfonse Spruce Pager: [phone]       After Hours (After 5p/  First Contact Pager: [phone]  weekends / holidays): Second Contact Pager: [phone]   Chief Complaint: Nausea/Vomiting/Diarrhea/Abdominal Pain  History of Present Illness:  Theresa Carter is a 50 year old female with past medical hx of HTN, DMII, GERD, Asthma, and Hidradenitis Suppurativa was admitted directly by her PCP for abdominal pain, diarrhea, nausea, and vomiting. She went to the clinic today and was admitted for further workup. She states she initially started developing symptoms on 03/03/21. She woke up at 2 am and had an episode of vomiting. After this the abdominal pain started. Later on that day she started having diarrhea. She normally has 3 bowel movements per day but has been having up to 10 bowel movement per day.   Patient came to the ED on 03/05/21 but left due to long wait times. Her labs were drawn at that time and were concerning for electrolyte abnormality and AKI so she was instructed to go back to the ED. She went back to the ED at [facility] on 03/06/21 and was given IV fluids, anti-emetics, pain meds, and electrolyte replacement. She left the ED around 2 pm after feeling much better and being able to tolerate PO well. After leaving the ED she started developing the symptoms again later that evening consisting of her original symptoms with only nausea without vomiting.   She describes her abdominal pain as being dull and always present. She states it does not radiate and is present in the epigastric region. She states eating and bowel movements worsen the pain and pain is helped by  antiemetics and strong pain meds but not by tylenol. She rates the as 8/10. She states she is getting intermittent sharper pains that come and go but the dull pain is always there. Along with the abdominal pain, the patient states she has been having chest tightness that has been present for 2 days.   Her other symptoms of vomiting and diarrhea have improved. She states she is more nauseous and has poor appetite. She endorses drinking 1.5 16oz water bottles. States she is not eating regularly since getting sick. Her diarrhea has started to have more solid consistency but is still have very frequent bowel movement especially after eating. She states she does have to wake up at night time to have a bowel movement. On further questioning, she does remember being told she had colitis when she was in the hospital last year but she did not get more information regarding this.   ROS positive for fatigue, dizziness, headache, calf tenderness, and a rash. She states she first noticed the rash on Sunday. She is denying any fevers, chills, hematemesis, melena, or hematochezia. She is also denying any recent travel or out of the ordinary foods. She states she went to [place] in October but has not traveled anywhere since then. She denies any sick contact exposure and states everyone in the house is healthy.    Past Medical History:  Diagnosis Date   Asthma    Chest pain 07/29/2018   Diabetes mellitus without complication (HCC)    Ectopic pregnancy    Hypertension    Past Surgical History:  Procedure Laterality Date   CHOLECYSTECTOMY     DILATION AND CURETTAGE OF UTERUS     ENDOMETRIAL ABLATION     HIP ARTHROSCOPY     x2   SALPINGECTOMY     TRIGGER FINGER RELEASE     TUBAL LIGATION     WRIST ARTHROSCOPY      Meds:  Metformin 1000 mg BID Losartan-HCTZ 100-25 mg Doxycycline 100 mg BID Finasteride 5 mg qd Singulair 10 mg qd Ozempic 1 mg weekly (newest med started 2 months ago) Humira 40 mg  weekly Flexiril 10 mg qd Albuterol prn Omeprazole 40 mg qhs    Allergies: NKDA  Family History:  Family History  Problem Relation Age of Onset   Venous thrombosis Mother    Breast cancer Mother    Lung cancer Father    Aneurysm Father        brain   Heart attack Brother    Stroke Brother    Venous thrombosis Son        Has antiphospholipid syndrome, and bechets disease.    Social History: Lives with husband and kids. She is disabled. She states she does not smoke and rarely drinks alcohol.   Social History   Socioeconomic History   Marital status: Married    Spouse name: Not on file   Number of children: Not on file   Years of education: Not on file   Highest education level: Not on file  Occupational History   Not on file  Tobacco Use   Smoking status: Former   Smokeless tobacco: Never   Tobacco comments:    Quit 2001.  Vaping Use   Vaping Use: Never used  Substance and Sexual Activity   Alcohol use: Yes    Comment: RARE   Drug use: Never   Sexual activity: Not on file  Other Topics Concern   Not on file  Social History Narrative   Not on file   Social Determinants of Health   Financial Resource Strain: Not on file  Food Insecurity: Not on file  Transportation Needs: Not on file  Physical Activity: Not on file  Stress: Not on file  Social Connections: Not on file  Intimate Partner Violence: Not on file   Review of Systems: Negative unless stated in the HPI.   Physical Exam: Blood pressure 121/87, pulse 95, temperature 98.3 F (36.8 C), temperature source Oral, resp. rate 18, height 5\' 5"  (1.651 m), weight 86.8 kg, SpO2 100 %.  Physical Exam General: NAD, obese female Head: Normocephalic without scalp lesions.  Eyes: Conjunctivae pink, sclerae white, without jaundice.  Mouth and Throat: Lips normal color, without lesions. Dry mucous membrane Neck: Neck supple with full range of motion (ROM). No masses or tenderness.  Lungs: CTAB, no wheeze,  rhonchi or rales.  Cardiovascular: Normal heart sounds, no r/m/g, 2+ pulses in all extremities Abdomen: TTP in the epigastric region and left upper quadrant, normal bowel sounds MSK: No asymmetry or muscle atrophy. Full range of motion (ROM) of all joints. No injuries noted. Strength 5/5 in all extremities.  Skin: warm, dry good skin turgor, rash present on back (in media tab) since Sunday Neuro: Alert and oriented. CN grossly intact Psych: Normal mood and normal affect   CBC Latest Ref Rng & Units 03/10/2021 03/09/2021 03/06/2021  WBC 4.0 - 10.5 K/uL 10.3 13.2(H) 16.7(H)  Hemoglobin 12.0 - 15.0 g/dL 11.3(L) 12.5 12.9  Hematocrit 36.0 - 46.0 % 32.7(L) 36.9 37.3  Platelets 150 - 400 K/uL 409(H) 510(H) 509(H)    CMP Latest Ref Rng & Units 03/09/2021 03/06/2021 03/05/2021  Glucose 70 - 99 mg/dL 170(H) 147(H) 164(H)  BUN 6 - 20 mg/dL 19 32(H) 23(H)  Creatinine 0.44 - 1.00 mg/dL 1.39(H) 1.74(H) 1.68(H)  Sodium 135 - 145 mmol/L 132(L) 135 135  Potassium 3.5 - 5.1 mmol/L 3.0(L) 3.2(L) 3.0(L)  Chloride 98 - 111 mmol/L 94(L) 94(L) 97(L)  CO2 22 - 32 mmol/L 23 23 21(L)  Calcium 8.9 - 10.3 mg/dL 8.1(L) 9.3 9.2  Total Protein 6.5 - 8.1 g/dL - 8.2(H) 8.1  Total Bilirubin 0.3 - 1.2 mg/dL - 1.2 1.6(H)  Alkaline Phos 38 - 126 U/L - 63 69  AST 15 - 41 U/L - 31 34  ALT 0 - 44 U/L - 41 40    Assessment & Plan by Problem: Ms. Krus is a 49 year old female with past medical hx of HTN, DMII, GERD, Asthma, and Hidradenitis Suppurativa was admitted directly by her PCP for abdominal pain, diarrhea, nausea, and vomiting.   Abdominal Pain/Diarrhea/Nausea/Vomiting Past has 7 day hx of new GI symptoms that have been improving slowly. Ddx include Bacterial vs Viral Gastroenteritis vs C. Diff vs COVID vs microscopic colitis vs Gastritis vs Chron's vs Pancreatitis. Among the differential, they all are likely with microscopic colitis and gastritis being least likely. Her epigastric pain is explained by gastritis  but not her diarrhea or vomiting. Microscopic colitis explains the diarrhea but not the nausea or vomiting. More likely are C. Diff or bacterial gastroenteritis as she has leukocytosis with neutrophil predominance. C. Diff is more likely as she is on PPI and abx. For this reason viral gastroenteritis is less likely but not ruled out. Her repeated episodes of diarrhea and vomiting explain her reproducible chest tenderness and abdominal tenderness. Chron's disease could explain her symptoms but she has not hx and symptoms are improving which makes this less likely. Pancreatitis could explain her symptoms but less likely given her cholecystectomy and low alcohol consumption.  -CT Abdomen Pelvis with contrast -GI panel -Respiratory Panel -C. Diff testing -Lipase -Symptom control (pain, and nausea) -IV and PO hydration -Electrolyte repletion  Acute Kidney Injury Hyponatremia Hypokalemia Hypomagesemia Her AKI and her electrolyte abnormalities appear to be 2/2 to her GI losses and poor oral intake.  -IV and oral hydration -Replete electrolytes -Monitor her electrolyte until wnl  Chronic Problems HTN Patient has hx of htn that she states is well controlled. She is on Hyzaar 100-25 mg. -Will continue home meds  DMII Patient has hx of DMII and her home medications include Metformin 1000 mg BID and Ozempic 1 g weekly.  -SSI  Hidradenitis Suppurativa  Patient has hx of Hidradenitis Suppurativa. Her home meds include Humira, Finasteride, and Doxycycline. -Will hold these in setting of GI symptoms  GERD Patient has hx of GERD that she states is well controlled with Omeprazole 40 mg qd.  -Will hold for now  Asthma Patient has hx of asthma that is well controlled with Singulair 10 mg and Albuterol as needed.  -Continue home meds  DVT prophx: Lovenox Diet: Clear liquids Bowel: PRN Code: Full  Prior to Admission Living Arrangement: Home Anticipated Discharge Location: Home Barriers to  Discharge: Medical Work up  Dispo: Admit patient to Observation with expected length of stay less than 2  midnights.  Idamae Schuller, MD Tillie Rung. [facility] Internal Medicine Residency, PGY-1  Pager: [phone]

## 2021-03-10 ENCOUNTER — Encounter (HOSPITAL_COMMUNITY): Payer: Self-pay | Admitting: Internal Medicine

## 2021-03-10 ENCOUNTER — Inpatient Hospital Stay (HOSPITAL_COMMUNITY): Payer: Medicare HMO

## 2021-03-10 ENCOUNTER — Observation Stay (HOSPITAL_COMMUNITY): Payer: Medicare HMO

## 2021-03-10 DIAGNOSIS — E119 Type 2 diabetes mellitus without complications: Secondary | ICD-10-CM

## 2021-03-10 DIAGNOSIS — Z9049 Acquired absence of other specified parts of digestive tract: Secondary | ICD-10-CM | POA: Diagnosis not present

## 2021-03-10 DIAGNOSIS — D1771 Benign lipomatous neoplasm of kidney: Secondary | ICD-10-CM | POA: Diagnosis not present

## 2021-03-10 DIAGNOSIS — R197 Diarrhea, unspecified: Secondary | ICD-10-CM | POA: Diagnosis not present

## 2021-03-10 DIAGNOSIS — N179 Acute kidney failure, unspecified: Principal | ICD-10-CM

## 2021-03-10 DIAGNOSIS — E871 Hypo-osmolality and hyponatremia: Secondary | ICD-10-CM | POA: Diagnosis not present

## 2021-03-10 DIAGNOSIS — Z7985 Long-term (current) use of injectable non-insulin antidiabetic drugs: Secondary | ICD-10-CM | POA: Diagnosis not present

## 2021-03-10 DIAGNOSIS — R160 Hepatomegaly, not elsewhere classified: Secondary | ICD-10-CM | POA: Diagnosis not present

## 2021-03-10 DIAGNOSIS — E860 Dehydration: Secondary | ICD-10-CM | POA: Diagnosis present

## 2021-03-10 DIAGNOSIS — R109 Unspecified abdominal pain: Secondary | ICD-10-CM | POA: Diagnosis not present

## 2021-03-10 DIAGNOSIS — I10 Essential (primary) hypertension: Secondary | ICD-10-CM | POA: Diagnosis not present

## 2021-03-10 DIAGNOSIS — K529 Noninfective gastroenteritis and colitis, unspecified: Secondary | ICD-10-CM | POA: Diagnosis present

## 2021-03-10 DIAGNOSIS — E669 Obesity, unspecified: Secondary | ICD-10-CM | POA: Diagnosis not present

## 2021-03-10 DIAGNOSIS — Z8249 Family history of ischemic heart disease and other diseases of the circulatory system: Secondary | ICD-10-CM | POA: Diagnosis not present

## 2021-03-10 DIAGNOSIS — Z20822 Contact with and (suspected) exposure to covid-19: Secondary | ICD-10-CM | POA: Diagnosis not present

## 2021-03-10 DIAGNOSIS — K760 Fatty (change of) liver, not elsewhere classified: Secondary | ICD-10-CM | POA: Diagnosis not present

## 2021-03-10 DIAGNOSIS — Z803 Family history of malignant neoplasm of breast: Secondary | ICD-10-CM | POA: Diagnosis not present

## 2021-03-10 DIAGNOSIS — Z7984 Long term (current) use of oral hypoglycemic drugs: Secondary | ICD-10-CM | POA: Diagnosis not present

## 2021-03-10 DIAGNOSIS — Z801 Family history of malignant neoplasm of trachea, bronchus and lung: Secondary | ICD-10-CM | POA: Diagnosis not present

## 2021-03-10 DIAGNOSIS — E876 Hypokalemia: Secondary | ICD-10-CM | POA: Diagnosis present

## 2021-03-10 DIAGNOSIS — Z823 Family history of stroke: Secondary | ICD-10-CM | POA: Diagnosis not present

## 2021-03-10 DIAGNOSIS — Z6832 Body mass index (BMI) 32.0-32.9, adult: Secondary | ICD-10-CM | POA: Diagnosis not present

## 2021-03-10 DIAGNOSIS — Z79899 Other long term (current) drug therapy: Secondary | ICD-10-CM | POA: Diagnosis not present

## 2021-03-10 DIAGNOSIS — J45909 Unspecified asthma, uncomplicated: Secondary | ICD-10-CM | POA: Diagnosis not present

## 2021-03-10 DIAGNOSIS — K219 Gastro-esophageal reflux disease without esophagitis: Secondary | ICD-10-CM | POA: Diagnosis present

## 2021-03-10 DIAGNOSIS — L732 Hidradenitis suppurativa: Secondary | ICD-10-CM | POA: Diagnosis present

## 2021-03-10 LAB — RESP PANEL BY RT-PCR (FLU A&B, COVID) ARPGX2
Influenza A by PCR: NEGATIVE
Influenza B by PCR: NEGATIVE
SARS Coronavirus 2 by RT PCR: NEGATIVE

## 2021-03-10 LAB — COMPREHENSIVE METABOLIC PANEL
ALT: 35 U/L (ref 0–44)
AST: 23 U/L (ref 15–41)
Albumin: 3.2 g/dL — ABNORMAL LOW (ref 3.5–5.0)
Alkaline Phosphatase: 57 U/L (ref 38–126)
Anion gap: 11 (ref 5–15)
BUN: 16 mg/dL (ref 6–20)
CO2: 23 mmol/L (ref 22–32)
Calcium: 7.9 mg/dL — ABNORMAL LOW (ref 8.9–10.3)
Chloride: 99 mmol/L (ref 98–111)
Creatinine, Ser: 1.2 mg/dL — ABNORMAL HIGH (ref 0.44–1.00)
GFR, Estimated: 55 mL/min — ABNORMAL LOW (ref >60)
Glucose, Bld: 148 mg/dL — ABNORMAL HIGH (ref 70–99)
Potassium: 3 mmol/L — ABNORMAL LOW (ref 3.5–5.1)
Sodium: 133 mmol/L — ABNORMAL LOW (ref 135–145)
Total Bilirubin: 1 mg/dL (ref 0.3–1.2)
Total Protein: 6.2 g/dL — ABNORMAL LOW (ref 6.5–8.1)

## 2021-03-10 LAB — GASTROINTESTINAL PANEL BY PCR, STOOL (REPLACES STOOL CULTURE)

## 2021-03-10 LAB — BASIC METABOLIC PANEL
Anion gap: 9 (ref 5–15)
BUN: 10 mg/dL (ref 6–20)
CO2: 27 mmol/L (ref 22–32)
Calcium: 8 mg/dL — ABNORMAL LOW (ref 8.9–10.3)
Chloride: 100 mmol/L (ref 98–111)
Creatinine, Ser: 1.01 mg/dL — ABNORMAL HIGH (ref 0.44–1.00)
GFR, Estimated: >60 mL/min (ref >60)
Glucose, Bld: 112 mg/dL — ABNORMAL HIGH (ref 70–99)
Potassium: 3 mmol/L — ABNORMAL LOW (ref 3.5–5.1)
Sodium: 136 mmol/L (ref 135–145)

## 2021-03-10 LAB — LIPASE, BLOOD: Lipase: 36 U/L (ref 11–51)

## 2021-03-10 LAB — GLUCOSE, CAPILLARY
Glucose-Capillary: 105 mg/dL — ABNORMAL HIGH (ref 70–99)
Glucose-Capillary: 132 mg/dL — ABNORMAL HIGH (ref 70–99)
Glucose-Capillary: 100 mg/dL — ABNORMAL HIGH (ref 70–99)
Glucose-Capillary: 97 mg/dL (ref 70–99)
Glucose-Capillary: 130 mg/dL — ABNORMAL HIGH (ref 70–99)

## 2021-03-10 LAB — C DIFFICILE QUICK SCREEN W PCR REFLEX
C Diff antigen: NEGATIVE
C Diff interpretation: NOT DETECTED
C Diff toxin: NEGATIVE

## 2021-03-10 LAB — PHOSPHORUS: Phosphorus: 2.1 mg/dL — ABNORMAL LOW (ref 2.5–4.6)

## 2021-03-10 LAB — CBC
HCT: 32.7 % — ABNORMAL LOW (ref 36.0–46.0)
Hemoglobin: 11.3 g/dL — ABNORMAL LOW (ref 12.0–15.0)
MCH: 31.3 pg (ref 26.0–34.0)
MCHC: 34.6 g/dL (ref 30.0–36.0)
MCV: 90.6 fL (ref 80.0–100.0)
Platelets: 409 10*3/uL — ABNORMAL HIGH (ref 150–400)
RBC: 3.61 MIL/uL — ABNORMAL LOW (ref 3.87–5.11)
RDW: 12.9 % (ref 11.5–15.5)
WBC: 10.3 10*3/uL (ref 4.0–10.5)
nRBC: 0 % (ref 0.0–0.2)

## 2021-03-10 LAB — LACTOFERRIN, FECAL, QUALITATIVE: Lactoferrin, Fecal, Qual: NEGATIVE

## 2021-03-10 LAB — MAGNESIUM: Magnesium: 2.1 mg/dL (ref 1.7–2.4)

## 2021-03-10 MED ORDER — INSULIN ASPART 100 UNIT/ML IJ SOLN
0.0000 [IU] | Freq: Three times a day (TID) | INTRAMUSCULAR | Status: DC
  Administered 2021-03-10 – 2021-03-11 (×3): 1 [IU] via SUBCUTANEOUS
  Administered 2021-03-12: 3 [IU] via SUBCUTANEOUS

## 2021-03-10 MED ORDER — POTASSIUM CHLORIDE 10 MEQ/100ML IV SOLN
INTRAVENOUS | Status: AC
  Filled 2021-03-10: qty 100

## 2021-03-10 MED ORDER — OXYCODONE HCL 5 MG PO TABS
2.5000 mg | ORAL_TABLET | Freq: Four times a day (QID) | ORAL | Status: DC | PRN
  Administered 2021-03-10 – 2021-03-12 (×8): 2.5 mg via ORAL
  Filled 2021-03-10 (×8): qty 1

## 2021-03-10 MED ORDER — HYDROCHLOROTHIAZIDE 25 MG PO TABS
25.0000 mg | ORAL_TABLET | Freq: Every day | ORAL | Status: DC
  Administered 2021-03-10: 25 mg via ORAL
  Filled 2021-03-10: qty 1

## 2021-03-10 MED ORDER — LOSARTAN POTASSIUM 50 MG PO TABS
100.0000 mg | ORAL_TABLET | Freq: Every day | ORAL | Status: DC
  Administered 2021-03-10 – 2021-03-12 (×3): 100 mg via ORAL
  Filled 2021-03-10 (×3): qty 2

## 2021-03-10 MED ORDER — ALBUTEROL SULFATE (2.5 MG/3ML) 0.083% IN NEBU
3.0000 mL | INHALATION_SOLUTION | Freq: Four times a day (QID) | RESPIRATORY_TRACT | Status: DC | PRN
  Filled 2021-03-10: qty 3

## 2021-03-10 MED ORDER — DICLOFENAC SODIUM 1 % EX GEL
2.0000 g | Freq: Four times a day (QID) | CUTANEOUS | Status: DC | PRN
  Administered 2021-03-11: 2 g via TOPICAL
  Filled 2021-03-10: qty 100

## 2021-03-10 MED ORDER — LACTATED RINGERS IV SOLN: INTRAVENOUS | Status: AC

## 2021-03-10 MED ORDER — PROCHLORPERAZINE EDISYLATE 10 MG/2ML IJ SOLN
10.0000 mg | Freq: Once | INTRAMUSCULAR | Status: AC
Start: 2021-03-10 — End: 2021-03-10
  Administered 2021-03-10: 10 mg via INTRAVENOUS
  Filled 2021-03-10: qty 2

## 2021-03-10 MED ORDER — LOSARTAN POTASSIUM-HCTZ 100-25 MG PO TABS: 1.0000 | ORAL_TABLET | Freq: Every day | ORAL | Status: DC

## 2021-03-10 MED ORDER — BUDESONIDE 0.5 MG/2ML IN SUSP
0.5000 mg | Freq: Two times a day (BID) | RESPIRATORY_TRACT | Status: DC
  Administered 2021-03-10 – 2021-03-12 (×4): 0.5 mg via RESPIRATORY_TRACT
  Filled 2021-03-10 (×5): qty 2

## 2021-03-10 MED ORDER — IOHEXOL 300 MG/ML  SOLN
100.0000 mL | Freq: Once | INTRAMUSCULAR | Status: AC | PRN
  Administered 2021-03-10: 100 mL via INTRAVENOUS

## 2021-03-10 MED ORDER — POTASSIUM CHLORIDE 10 MEQ/100ML IV SOLN
10.0000 meq | INTRAVENOUS | Status: AC
  Administered 2021-03-10 (×6): 10 meq via INTRAVENOUS
  Filled 2021-03-10 (×6): qty 100

## 2021-03-10 MED ORDER — GADOBUTROL 1 MMOL/ML IV SOLN
9.0000 mL | Freq: Once | INTRAVENOUS | Status: AC | PRN
  Administered 2021-03-10: 9 mL via INTRAVENOUS

## 2021-03-10 MED ORDER — PANTOPRAZOLE SODIUM 40 MG PO TBEC
80.0000 mg | DELAYED_RELEASE_TABLET | Freq: Every day | ORAL | Status: DC
  Administered 2021-03-10 – 2021-03-12 (×3): 80 mg via ORAL
  Filled 2021-03-10 (×3): qty 2

## 2021-03-10 MED ORDER — MONTELUKAST SODIUM 10 MG PO TABS
10.0000 mg | ORAL_TABLET | Freq: Every day | ORAL | Status: DC
  Administered 2021-03-10 – 2021-03-11 (×2): 10 mg via ORAL
  Filled 2021-03-10 (×2): qty 1

## 2021-03-10 NOTE — Progress Notes (Signed)
Internal Medicine Clinic Attending  Case discussed with Dr. Basaraba  At the time of the visit.  We reviewed the resident's history and exam and pertinent patient test results.  I agree with the assessment, diagnosis, and plan of care documented in the resident's note.  

## 2021-03-10 NOTE — Progress Notes (Signed)
Pt off of unit to CT

## 2021-03-10 NOTE — Progress Notes (Signed)
Pt noted with 238ml yellow colored emesis. Continuously dry heaving as well. Pt reports "I don't feel well I feel like I could pass out". MD provider on call notified, awaiting new orders.

## 2021-03-10 NOTE — Progress Notes (Signed)
Pt back from CT, assisted with bath due to small incontinence of urine and emesis noted on gown. Stool specimen send to lab. Compazine given x1 IV. Will monitor for effectiveness. IV potassium restarted. No further emesis at present time.

## 2021-03-10 NOTE — Progress Notes (Signed)
Patient direct admit to room. Oriented to room and call bell system. Instructed to call for assist due to IVF.

## 2021-03-10 NOTE — Progress Notes (Signed)
HD#0 SUBJECTIVE:  Patient Summary: Theresa Carter is a 50 y.o. with a pertinent PMH of  HTN, T2DM, GERD, Asthma, and Hidradenitis Suppurativa who presented with 1 week of abdominal pain, nausea, vomiting, and diarrhea and admitted for acute diarrhea and AKI.  Overnight Events: Pt vomited twice without blood or new pain. Otherwise no acute events.  Interim History: Pt was seen and examined at bedside this AM. She was laying in her bed and appeared slightly uncomfortable. She vomited twice last night, yellow stomach contents. She has stable mid epigastric abdominal pain. She had one bowel movement last night that was slightly formed and without blood. Reviewing her medications, she has been stable on 1 mg ozempic for 2 months after being on 3-2.4 mg Victoza prior. She denied any other changes in existing medications or other new medications. She denied any new or worsening complaints.  OBJECTIVE:  Vital Signs: Vitals:   03/09/21 2156 03/10/21 0254 03/10/21 0424 03/10/21 0916  BP: 121/87 124/79 124/79 114/71  Pulse: 95 78 79 86  Resp: 18 (!) 21 18 16   Temp: 98.3 F (36.8 C) 97.6 F (36.4 C) 97.6 F (36.4 C) 97.8 F (36.6 C)  TempSrc: Oral Oral Oral Oral  SpO2: 100% 99% 99% 100%  Weight: 86.8 kg     Height: 5\' 5"  (1.651 m)      Supplemental O2: Room Air SpO2: 100 %  Filed Weights   03/09/21 2156  Weight: 86.8 kg     Intake/Output Summary (Last 24 hours) at 03/10/2021 1134 Last data filed at 03/10/2021 0947 Gross per 24 hour  Intake 240 ml  Output 204 ml  Net 36 ml   Net IO Since Admission: 36 mL [03/10/21 1134]  Physical Exam: Physical Exam Vitals reviewed.  Constitutional:      Appearance: She is overweight. She is ill-appearing. She is not toxic-appearing.     Comments: Adult female laying in bed and appears uncomfortable.   HENT:     Head: Normocephalic and atraumatic.  Cardiovascular:     Rate and Rhythm: Normal rate and regular rhythm.  Pulmonary:      Effort: Pulmonary effort is normal.     Breath sounds: Normal breath sounds.  Abdominal:     General: Bowel sounds are normal.     Palpations: Abdomen is soft.     Tenderness: There is abdominal tenderness in the epigastric area and periumbilical area.  Musculoskeletal:     Right lower leg: No edema.     Left lower leg: No edema.  Skin:    General: Skin is warm and dry.  Neurological:     Mental Status: She is alert.     Motor: No atrophy.    Patient Lines/Drains/Airways Status     Active Line/Drains/Airways     Name Placement date Placement time Site Days   Peripheral IV 03/10/21 20 G 2.5" Right;Anterior Forearm 03/10/21  0018  Forearm  less than 1            Pertinent Labs: CBC Latest Ref Rng & Units 03/10/2021 03/09/2021 03/06/2021  WBC 4.0 - 10.5 K/uL 10.3 13.2(H) 16.7(H)  Hemoglobin 12.0 - 15.0 g/dL 11.3(L) 12.5 12.9  Hematocrit 36.0 - 46.0 % 32.7(L) 36.9 37.3  Platelets 150 - 400 K/uL 409(H) 510(H) 509(H)    CMP Latest Ref Rng & Units 03/10/2021 03/09/2021 03/06/2021  Glucose 70 - 99 mg/dL 148(H) 170(H) 147(H)  BUN 6 - 20 mg/dL 16 19 32(H)  Creatinine 0.44 - 1.00 mg/dL  1.20(H) 1.39(H) 1.74(H)  Sodium 135 - 145 mmol/L 133(L) 132(L) 135  Potassium 3.5 - 5.1 mmol/L 3.0(L) 3.0(L) 3.2(L)  Chloride 98 - 111 mmol/L 99 94(L) 94(L)  CO2 22 - 32 mmol/L 23 23 23   Calcium 8.9 - 10.3 mg/dL 7.9(L) 8.1(L) 9.3  Total Protein 6.5 - 8.1 g/dL 6.2(L) - 8.2(H)  Total Bilirubin 0.3 - 1.2 mg/dL 1.0 - 1.2  Alkaline Phos 38 - 126 U/L 57 - 63  AST 15 - 41 U/L 23 - 31  ALT 0 - 44 U/L 35 - 41    Recent Labs    03/10/21 0224 03/10/21 0640  GLUCAP 105* 132*     Pertinent Imaging: CT ABDOMEN PELVIS W CONTRAST  Result Date: 03/10/2021 CLINICAL DATA:  Abdominal pain, fever, epigastric pain EXAM: CT ABDOMEN AND PELVIS WITH CONTRAST TECHNIQUE: Multidetector CT imaging of the abdomen and pelvis was performed using the standard protocol following bolus administration of intravenous  contrast. CONTRAST:  139mL OMNIPAQUE IOHEXOL 300 MG/ML  SOLN COMPARISON:  None. FINDINGS: Lower chest: No acute abnormality. Hepatobiliary: Moderate hepatic steatosis. Probable focal fatty sparing within the medial aspect of segment 4. Focal fatty infiltration along the falciform ligament. There are at least 3 enhancing masses within the right hepatic lobe measuring up to 14 mm best seen on image # 25/4. These are nonspecific but may represent small hemangioma, adenoma, or focal nodular hyperplasia in a patient of this age. 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 Cholecystectomy has been performed. Mild extrahepatic biliary ductal dilation likely reflects post cholecystectomy change. Pancreas: Unremarkable Spleen: Unremarkable Adrenals/Urinary Tract: Adrenal glands are unremarkable. The kidneys are normal in size and position. A macroscopic fat containing exophytic mass arises from the lateral lower pole of the left kidney in keeping with a angiomyolipoma measuring 3 cm in maximal dimension. The kidneys are otherwise unremarkable. Bladder is unremarkable. Stomach/Bowel: Stomach is within normal limits. Appendix appears normal. No evidence of bowel wall thickening, distention, or inflammatory changes. Vascular/Lymphatic: Retroaortic left renal vein. Abdominal vasculature otherwise unremarkable. No pathologic adenopathy. Reproductive: Uterus and bilateral adnexa are unremarkable. Other: No abdominal wall hernia or abnormality. No abdominopelvic ascites. 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 No other lytic or blastic bone lesion identified. No acute bone abnormality. IMPRESSION: No acute intra-abdominal pathology identified. No definite radiographic explanation for the patient's reported symptoms.  Moderate hepatic steatosis. Multiple indeterminate enhancing masses within the right hepatic lobe. See differential considerations above. Dedicated contrast enhanced MRI examination would be more helpful for further characterization. 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 Comparison with prior examinations, if available, would be helpful in determining chronicity. Electronically Signed   By: Fidela Salisbury M.D.   On: 03/10/2021 04:14    ASSESSMENT/PLAN:  Assessment: Active Problems:   Acute diarrhea  Theresa Carter is a 50 y.o. with a pertinent PMH of  HTN, T2DM, GERD, Asthma, and Hidradenitis Suppurativa who presented with 1 week of abdominal pain, nausea, vomiting, and diarrhea and admitted for acute diarrhea and AKI.  Plan: #Acute Diarrheal Illness with Nausea, Vomiting, and Abdominal Pain Workup thus far with CTAP, C diff, COVID, stool PCR, lactoferrin, lipase has been unremarkable. No medication changes to suggest drug effect, stable ozempic dose over 2 months, chronic  metformin use without issue.We don't have a unifying diagnosis although with her baseline of up to 3 bowel movements a day, outpatient GI workup following resolution of acute diarrhea is recommended. Highest suspicion for viral gastroenteritis despite no pathogen identified on GI panel. She is improving slowly with IVF and electrolyte replacement. We will continue with fluid and electrolyte replacement and advance her diet as tolerated.  #AKI/Hyponatremia/Hypokalemia/Hypomagnesemia 2/2 GI losses Improving overall with fluids and electrolyte replacement. Mg 2.1. Cr 1.20 (baseline 0.6-0.8). K remains at 3.0 after 30 mEq IV. 60 mEq K again this AM and will recheck BMP in the afternoon.   #Liver Masses CTAP revealed multiple indeterminate enhancing masses in the right lobe of the liver, suspect adenoma. MRI for further evaluation.    Chronic Problems HTN Hyzaar 100-25 mg.   DMII Hold Metformin 1000 mg BID and Ozempic 1 g weekly.  -SSI   Hidradenitis Suppurativa  Patient has hx of Hidradenitis Suppurativa. Her home meds include Humira, Finasteride, and Doxycycline. -Will hold these in setting of GI symptoms   GERD Home Omeprazole 40 mg daily - Pantoprazole 80 mg daily during admission   Asthma Singulair 10 mg and Albuterol as needed.   Best Practice: Diet: Clear liquid diet IVF: Fluids: LR, Rate:  125 mL/hr VTE: enoxaparin (LOVENOX) injection 40 mg Start: 03/10/21 1000 Code: Full Family Contact: Andre Lefort, Spouse DISPO: Anticipated discharge in 1-2 days to Home pending Medical stability.  Signature: Johny Blamer Medical Student, OMS [place] [facility] Pager: [phone] 11:34 AM, 03/10/2021   Please contact the on call pager after 5 pm and on weekends at [phone].

## 2021-03-11 LAB — BASIC METABOLIC PANEL
Anion gap: 10 (ref 5–15)
BUN: 6 mg/dL (ref 6–20)
CO2: 27 mmol/L (ref 22–32)
Calcium: 8 mg/dL — ABNORMAL LOW (ref 8.9–10.3)
Chloride: 100 mmol/L (ref 98–111)
Creatinine, Ser: 1.04 mg/dL — ABNORMAL HIGH (ref 0.44–1.00)
GFR, Estimated: >60 mL/min (ref >60)
Glucose, Bld: 120 mg/dL — ABNORMAL HIGH (ref 70–99)
Potassium: 3.5 mmol/L (ref 3.5–5.1)
Sodium: 137 mmol/L (ref 135–145)

## 2021-03-11 LAB — CBC
HCT: 33.5 % — ABNORMAL LOW (ref 36.0–46.0)
Hemoglobin: 11.4 g/dL — ABNORMAL LOW (ref 12.0–15.0)
MCH: 31.9 pg (ref 26.0–34.0)
MCHC: 34 g/dL (ref 30.0–36.0)
MCV: 93.8 fL (ref 80.0–100.0)
Platelets: 366 10*3/uL (ref 150–400)
RBC: 3.57 MIL/uL — ABNORMAL LOW (ref 3.87–5.11)
RDW: 12.8 % (ref 11.5–15.5)
WBC: 7.2 10*3/uL (ref 4.0–10.5)
nRBC: 0.4 % — ABNORMAL HIGH (ref 0.0–0.2)

## 2021-03-11 LAB — GLUCOSE, CAPILLARY
Glucose-Capillary: 91 mg/dL (ref 70–99)
Glucose-Capillary: 128 mg/dL — ABNORMAL HIGH (ref 70–99)
Glucose-Capillary: 143 mg/dL — ABNORMAL HIGH (ref 70–99)

## 2021-03-11 LAB — MAGNESIUM: Magnesium: 1.3 mg/dL — ABNORMAL LOW (ref 1.7–2.4)

## 2021-03-11 MED ORDER — MAGNESIUM SULFATE 2 GM/50ML IV SOLN
2.0000 g | Freq: Once | INTRAVENOUS | Status: AC
  Administered 2021-03-11: 2 g via INTRAVENOUS
  Filled 2021-03-11: qty 50

## 2021-03-11 MED ORDER — POTASSIUM CHLORIDE CRYS ER 20 MEQ PO TBCR
40.0000 meq | EXTENDED_RELEASE_TABLET | Freq: Once | ORAL | Status: AC
  Administered 2021-03-11: 40 meq via ORAL
  Filled 2021-03-11: qty 2

## 2021-03-11 MED ORDER — POTASSIUM CHLORIDE 10 MEQ/100ML IV SOLN
10.0000 meq | INTRAVENOUS | Status: DC
  Administered 2021-03-11 (×2): 10 meq via INTRAVENOUS
  Filled 2021-03-11 (×2): qty 100

## 2021-03-11 MED ORDER — ALUM & MAG HYDROXIDE-SIMETH 200-200-20 MG/5ML PO SUSP: 15.0000 mL | Freq: Four times a day (QID) | ORAL | Status: DC | PRN

## 2021-03-11 NOTE — Progress Notes (Signed)
HD#1 Subjective:  Overnight Events: NAEO   Patient having some abdominal pain today but it is not too bad. She feels a bit more bloated than yesterday but does want to try some food today. Doesn't like the IV potassium and she would rather try taking it orally. Had two loose but not watery bowel movements today.  Objective:  Vital signs in last 24 hours: Vitals:   03/10/21 1715 03/10/21 2054 03/10/21 2128 03/11/21 0503  BP: 118/72 105/70  (!) 116/49  Pulse: 93 96  90  Resp: 17 18  18   Temp: (!) 97.5 F (36.4 C) 97.7 F (36.5 C)  98 F (36.7 C)  TempSrc: Oral Oral  Oral  SpO2: 99% 99% 98% 97%  Weight:    88.8 kg  Height:       Supplemental O2: Room Air SpO2: 97 %   Physical Exam:  Constitutional:Uncomfortable appearing woman laying in bed, in no acute distress HENT: normocephalic atraumatic, mucous membranes moist Eyes: conjunctiva non-erythematous Neck: supple Cardiovascular: regular rate and rhythm, no m/r/g Pulmonary/Chest: normal work of breathing on room air, lungs clear to auscultation bilaterally Abdominal: soft, mildly tender diffusely, normal bowel sounds MSK: normal bulk and tone Neurological: alert & oriented x 3, answering questions appropriately Skin: warm and dry Psych: normal affect  Filed Weights   03/09/21 2156 03/11/21 0503  Weight: 86.8 kg 88.8 kg     Intake/Output Summary (Last 24 hours) at 03/11/2021 0828 Last data filed at 03/11/2021 0748 Gross per 24 hour  Intake 2179.46 ml  Output --  Net 2179.46 ml   Net IO Since Admission: 1,975.46 mL [03/11/21 0828]  Pertinent Labs: CBC Latest Ref Rng & Units 03/10/2021 03/09/2021 03/06/2021  WBC 4.0 - 10.5 K/uL 10.3 13.2(H) 16.7(H)  Hemoglobin 12.0 - 15.0 g/dL 11.3(L) 12.5 12.9  Hematocrit 36.0 - 46.0 % 32.7(L) 36.9 37.3  Platelets 150 - 400 K/uL 409(H) 510(H) 509(H)    CMP Latest Ref Rng & Units 03/10/2021 03/10/2021 03/09/2021  Glucose 70 - 99 mg/dL 112(H) 148(H) 170(H)  BUN 6 - 20  mg/dL 10 16 19   Creatinine 0.44 - 1.00 mg/dL 1.01(H) 1.20(H) 1.39(H)  Sodium 135 - 145 mmol/L 136 133(L) 132(L)  Potassium 3.5 - 5.1 mmol/L 3.0(L) 3.0(L) 3.0(L)  Chloride 98 - 111 mmol/L 100 99 94(L)  CO2 22 - 32 mmol/L 27 23 23   Calcium 8.9 - 10.3 mg/dL 8.0(L) 7.9(L) 8.1(L)  Total Protein 6.5 - 8.1 g/dL - 6.2(L) -  Total Bilirubin 0.3 - 1.2 mg/dL - 1.0 -  Alkaline Phos 38 - 126 U/L - 57 -  AST 15 - 41 U/L - 23 -  ALT 0 - 44 U/L - 35 -    Imaging: MR LIVER W WO CONTRAST  Result Date: 03/11/2021 CLINICAL DATA:  Liver lesions on CT. EXAM: MRI ABDOMEN WITHOUT AND WITH CONTRAST TECHNIQUE: Multiplanar multisequence MR imaging of the abdomen was performed both before and after the administration of intravenous contrast. CONTRAST:  10mL GADAVIST GADOBUTROL 1 MMOL/ML IV SOLN COMPARISON:  CT scan 03/10/2021 FINDINGS: Lower chest: Unremarkable Hepatobiliary: Diffuse loss of signal intensity in the liver parenchyma on out of phase T1 imaging is compatible with fatty deposition. Scattered areas of subcapsular fatty sparing are noted in both hepatic lobes. Liver measures 18.5 cm craniocaudal length, enlarged. At least 9 tiny hypervascular lesions are seen scattered throughout both hepatic lobes, although difficult to characterize given tiny size, lesions appear to be relatively isointense to background liver parenchyma on non-fat suppressed  pre contrast T1 imaging and show less loss of signal than background liver parenchyma on out of phase T1 imaging. Lesion shows subtle intermediate to high signal intensity on T2 imaging. Generally, imaging characteristics are nonspecific but likely benign. Lesions range in size from 3 mm up to 15 mm. Gallbladder surgically absent. No intrahepatic or extrahepatic biliary dilation. Pancreas: No focal mass lesion. No dilatation of the main duct. No intraparenchymal cyst. No peripancreatic edema. Spleen:  No splenomegaly. No focal mass lesion. Adrenals/Urinary Tract: No adrenal  nodule or mass. Right kidney unremarkable. Susceptibility artifact from surgical clip noted adjacent to the upper pole right kidney. 2.8 x 1.9 x 2.4 cm angiomyolipoma noted lower pole left kidney. Stomach/Bowel: Stomach is unremarkable. No gastric wall thickening. No evidence of outlet obstruction. No small bowel or colonic dilatation within the visualized abdomen. Vascular/Lymphatic: No abdominal aortic aneurysm. No abdominal aortic atherosclerotic calcification. There is no gastrohepatic or hepatoduodenal ligament lymphadenopathy. No retroperitoneal or mesenteric lymphadenopathy. Other:  No intraperitoneal free fluid. Musculoskeletal: No focal suspicious marrow enhancement within the visualized bony anatomy. IMPRESSION: 1. At least 9 tiny hypervascular lesions are seen scattered throughout both hepatic lobes. While these are difficult to characterize given tiny size and imaging characteristics are nonspecific, findings are likely benign. Differential considerations would include multiple tiny hemangiomas and tiny vascular malformations. Imaging features are not typical for [place] although adenoma is a possibility. Follow-up MRI in 3-6 months recommended to ensure stability. 2. Hepatomegaly and fatty deposition. 3. Left renal angiomyolipoma. Electronically Signed   By: Misty Stanley M.D.   On: 03/11/2021 07:18    Assessment/Plan:   Principal Problem:   Diarrhea Active Problems:   Acute diarrhea   Patient Summary:  Theresa Carter is a 50 y.o. with a pertinent PMH of  HTN, T2DM, GERD, Asthma, and Hidradenitis Suppurativa who presented with 1 week of abdominal pain, nausea, vomiting, and diarrhea and admitted for acute diarrhea and AKI.   #Acute Diarrheal Illness with Nausea, Vomiting, and Abdominal Pain Workup thus far with CTAP, C diff, COVID, stool PCR, lactoferrin, lipase has been unremarkable. No medication changes to suggest drug effect, stable ozempic dose over 2 months, chronic metformin use  without issue.Highest suspicion for viral gastroenteritis despite no pathogen identified on GI panel. She is improving slowly with IVF and electrolyte replacement. We will continue with fluid and electrolyte replacement. Will try some solid foods today and see how she does. - advance diet as tolerated - daily bmp    #AKI/Hyponatremia/Hypokalemia/Hypomagnesemia 2/2 GI losses Repleting K now, follow up morning BMP - daily bmp  #Liver Masses Mri identified 9 tiny hypervascular lesions scattered throughout both hepatic lobes. Difficult ot characterize given tiny size and non-specific findings. Ddx tiny hemangiomas, and tiny vascular malformations.  -Follow up MRI in 3-6 months.    HTN Hyzaar 100-25 mg.   DMII Hold Metformin 1000 mg BID and Ozempic 1 g weekly.  -SSI   Hidradenitis Suppurativa  Patient has hx of Hidradenitis Suppurativa. Her home meds include Humira, Finasteride, and Doxycycline. -Will hold these in setting of GI symptoms   GERD Home Omeprazole 40 mg daily - Pantoprazole 80 mg daily during admission   Asthma Singulair 10 mg and Albuterol as needed.   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

## 2021-03-11 NOTE — Plan of Care (Signed)
  Problem: Education: Goal: Knowledge of General Education information will improve Description: Including pain rating scale, medication(s)/side effects and non-pharmacologic comfort measures Outcome: Progressing   Problem: Clinical Measurements: Goal: Will remain free from infection Outcome: Progressing   Problem: Nutrition: Goal: Adequate nutrition will be maintained Outcome: Progressing   Problem: Elimination: Goal: Will not experience complications related to bowel motility Outcome: Progressing   

## 2021-03-12 DIAGNOSIS — R197 Diarrhea, unspecified: Secondary | ICD-10-CM | POA: Diagnosis not present

## 2021-03-12 LAB — BASIC METABOLIC PANEL
Anion gap: 10 (ref 5–15)
BUN: 7 mg/dL (ref 6–20)
CO2: 28 mmol/L (ref 22–32)
Calcium: 7.6 mg/dL — ABNORMAL LOW (ref 8.9–10.3)
Chloride: 99 mmol/L (ref 98–111)
Creatinine, Ser: 0.99 mg/dL (ref 0.44–1.00)
GFR, Estimated: >60 mL/min (ref >60)
Glucose, Bld: 113 mg/dL — ABNORMAL HIGH (ref 70–99)
Potassium: 3.1 mmol/L — ABNORMAL LOW (ref 3.5–5.1)
Sodium: 137 mmol/L (ref 135–145)

## 2021-03-12 LAB — CBC
HCT: 30.9 % — ABNORMAL LOW (ref 36.0–46.0)
Hemoglobin: 10.6 g/dL — ABNORMAL LOW (ref 12.0–15.0)
MCH: 31.5 pg (ref 26.0–34.0)
MCHC: 34.3 g/dL (ref 30.0–36.0)
MCV: 91.7 fL (ref 80.0–100.0)
Platelets: 378 10*3/uL (ref 150–400)
RBC: 3.37 MIL/uL — ABNORMAL LOW (ref 3.87–5.11)
RDW: 12.9 % (ref 11.5–15.5)
WBC: 7.6 10*3/uL (ref 4.0–10.5)
nRBC: 0 % (ref 0.0–0.2)

## 2021-03-12 LAB — MAGNESIUM: Magnesium: 1.5 mg/dL — ABNORMAL LOW (ref 1.7–2.4)

## 2021-03-12 LAB — GLUCOSE, CAPILLARY: Glucose-Capillary: 220 mg/dL — ABNORMAL HIGH (ref 70–99)

## 2021-03-12 MED ORDER — POTASSIUM CHLORIDE CRYS ER 20 MEQ PO TBCR
40.0000 meq | EXTENDED_RELEASE_TABLET | Freq: Two times a day (BID) | ORAL | Status: AC
  Administered 2021-03-12: 40 meq via ORAL

## 2021-03-12 MED ORDER — MAGNESIUM SULFATE 2 GM/50ML IV SOLN
2.0000 g | Freq: Once | INTRAVENOUS | Status: AC
  Administered 2021-03-12: 2 g via INTRAVENOUS
  Filled 2021-03-12: qty 50

## 2021-03-12 NOTE — Plan of Care (Signed)

## 2021-03-12 NOTE — Progress Notes (Signed)
DISCHARGE NOTE HOME Theresa Carter to be discharged Home per MD order. Discussed prescriptions and follow up appointments with the patient. Prescriptions given to patient; medication list explained in detail. Patient verbalized understanding.  Skin clean, dry and intact without evidence of skin break down, no evidence of skin tears noted. IV catheter discontinued intact. Site without signs and symptoms of complications. Dressing and pressure applied. Pt denies pain at the site currently. No complaints noted.  Patient free of lines, drains, and wounds.   An After Visit Summary (AVS) was printed and given to the patient. Patient escorted via wheelchair, and discharged home via private auto with daughter.   Dolores Hoose, RN

## 2021-03-12 NOTE — Plan of Care (Signed)
  Problem: Education: Goal: Knowledge of General Education information will improve Description: Including pain rating scale, medication(s)/side effects and non-pharmacologic comfort measures Outcome: Adequate for Discharge   Problem: Health Behavior/Discharge Planning: Goal: Ability to manage health-related needs will improve 03/12/2021 1105 by Dolores Hoose, RN Outcome: Adequate for Discharge 03/12/2021 0801 by Dolores Hoose, RN Outcome: Progressing   Problem: Clinical Measurements: Goal: Ability to maintain clinical measurements within normal limits will improve Outcome: Adequate for Discharge Goal: Will remain free from infection 03/12/2021 1105 by Dolores Hoose, RN Outcome: Adequate for Discharge 03/12/2021 0801 by Dolores Hoose, RN Outcome: Progressing Goal: Diagnostic test results will improve Outcome: Adequate for Discharge Goal: Respiratory complications will improve Outcome: Adequate for Discharge Goal: Cardiovascular complication will be avoided Outcome: Adequate for Discharge   Problem: Activity: Goal: Risk for activity intolerance will decrease Outcome: Adequate for Discharge   Problem: Nutrition: Goal: Adequate nutrition will be maintained 03/12/2021 1105 by Dolores Hoose, RN Outcome: Adequate for Discharge 03/12/2021 0801 by Dolores Hoose, RN Outcome: Progressing   Problem: Coping: Goal: Level of anxiety will decrease Outcome: Adequate for Discharge   Problem: Elimination: Goal: Will not experience complications related to bowel motility Outcome: Adequate for Discharge Goal: Will not experience complications related to urinary retention Outcome: Adequate for Discharge   Problem: Pain Managment: Goal: General experience of comfort will improve 03/12/2021 1105 by Dolores Hoose, RN Outcome: Adequate for Discharge 03/12/2021 0801 by Dolores Hoose, RN Outcome: Progressing   Problem: Safety: Goal: Ability to remain free  from injury will improve Outcome: Adequate for Discharge   Problem: Skin Integrity: Goal: Risk for impaired skin integrity will decrease Outcome: Adequate for Discharge

## 2021-03-12 NOTE — Discharge Summary (Addendum)
Name: Theresa Carter MRN: [id number] DOB: [birth year] 50 y.o. PCP: Jose Persia, MD  Date of Admission: 03/09/2021  9:11 PM Date of Discharge: 11.20.22 Attending Physician: Aldine Contes, MD  Discharge Diagnosis: 1. Acute diarrheal illness with nausea, vomiting 2. AKI 3. Hypokalemia 4. Hyponatremia 5. Hypomagnesemia 6. Liver mass 7. HTN 8. DMII 9. Hidradenitis suppurativa 10. GERD 11. 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 Angiomyolipoma 14. 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   albuterol 108 (90 Base) MCG/ACT inhaler Commonly known as: VENTOLIN HFA Inhale 2 puffs into the lungs every 6 (six) hours as needed for wheezing or shortness of breath.   cyclobenzaprine 10 MG tablet Commonly known as: FLEXERIL TAKE ONE TABLET BY MOUTH EVERY NIGHT AT BEDTIME   dicyclomine 20 MG tablet Commonly known as: BENTYL Take 1 tablet (20 mg total) by mouth 2 (two) times daily.   doxycycline 100 MG tablet Commonly known as: VIBRA-TABS Take 100 mg by mouth 2 (two) times daily.   finasteride 5 MG tablet Commonly known as: PROSCAR Take 5 mg by mouth daily.   fluticasone 110 MCG/ACT inhaler Commonly known as: FLOVENT HFA Inhale 1 puff into the lungs 2 (two) times daily.   fluticasone 50 MCG/ACT nasal spray Commonly known as: FLONASE Place 1-2 sprays into both nostrils daily. What changed:  when to take this reasons to take this   Humira Pen 40 MG/0.4ML Pnkt Generic drug: Adalimumab Inject 40 mg into the skin once a week. Tuesday   losartan-hydrochlorothiazide 100-25 MG tablet Commonly known as: HYZAAR TAKE ONE TABLET BY MOUTH DAILY   metFORMIN 1000 MG tablet Commonly known as: GLUCOPHAGE Take 1 tablet  (1,000 mg total) by mouth 2 (two) times daily.   montelukast 10 MG tablet Commonly known as: SINGULAIR Take 1 tablet (10 mg total) by mouth daily. What changed: when to take this   mupirocin ointment 2 % Commonly known as: BACTROBAN Apply topically 3 (three) times daily.   omeprazole 40 MG capsule Commonly known as: PRILOSEC Take 1 capsule (40 mg total) by mouth daily. What changed: when to take this   ondansetron 4 MG disintegrating tablet Commonly known as: Zofran ODT $RemoveBe'4mg'fQkBGUtjg$  ODT q4 hours prn nausea/vomit What changed:  how much to take when to take this reasons to take this additional instructions   OneTouch Delica Lancets 89F Misc Check blood sugar once daily as needed   OneTouch Verio test strip Generic drug: glucose blood Check blood glucose once daily as needed   OneTouch Verio w/Device Kit 1 kit by Does not apply route daily. Check blood glucose once daily as needed   Pen Needles 33G X 4 MM Misc Use to inject diabetic medication into the skin 1 time daily   promethazine 12.5 MG tablet Commonly known as: PHENERGAN Take 1 tablet (12.5 mg total) by mouth every 6 (six) hours as needed for nausea or vomiting.   Semaglutide (1 MG/DOSE) 4 MG/3ML Sopn Inject 1 mg into the skin once a week.        Disposition and follow-up:   [name] was discharged from [facility] in Stable condition.  At the hospital follow up visit please address:  1.  Diarrhea-  assess that this has continued to improve and patient has been able to take in adequate PO intake.   2. Hypokalemia/hypomagnesemia- recheck labs and assess now that GI losses have decreased  3. Liver mass- thought to be tiny vascular malformations vs tiny hemangiomas. Follow up MRI recommended in 3-6 months  4. AKI- recheck creatinine  2.  Labs / imaging needed at time of follow-up: cbc bmp mg  3.  Pending labs/ test needing follow-up: none  Follow-up Appointments:   Hospital  Course by problem list: 1. Acute Diarrheal illness- Patient came in on 11/17 after experiencing several days of diarrhea. She had been seen at urgent care and gotten her electrolytes repleted and was given fluids. She improved some but then started experiencing N/V/D again and was admitted to the hospital on the 17th. CTAP did not show acute abdominal pathology. Infectious workup was negative with GI PCR and Cdiff screen negative. Patient had not started any new medications, most recent being ozempic but she had been tolerating it without difficulty for two months and had not increased her dose. Her metformin and doxycyline were also long term medications. Patient was treated conservatively with fluids and diet was slowly advanced. By 11/20 she was tolerating a normal diet and diarrhea had improved and she was deemed stable for discharge.   2. Liver Mass-  Identified on initial CT, reevaluated on MRI with contrast which was nonspecific but described at least 9 tiny hypervascular lesions scattered throughout both hepatic lobes. Likely hemangiomas vs vascular malformations. Follow up imaging recommended to assess for stability in 3-6 months.   3. AKI Patient came in with Creatinine elevated to 1.49 from a baseline of about 1.This improved to 0.99 by 11/20 with administration of fluids and increase in PO intake.   4. Hyponatremia/Hypomagnesemia/Hypokalemia Low electrolytes in the setting of GI losses. These were repleted until normal diet was tolerated and GI output decreased  5. HTN Continued home medications  8. DMII; Hepatic steatosis Metformin was held 2/2 AKI. Restarted metformin and ozempic upon discharge.  9. Hidradenitis suppurativa Doxycycline held 2/2 diarrhea, restarted upon discharge  10. GERD Continued home medications  11. 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  Discharge Exam:   BP 127/80 (BP Location: Left Arm)   Pulse (!) 104   Temp 98.2 F (36.8 C) (Oral)   Resp 18   Ht $R'5\' 5"'Ds$  (1.651 m)   Wt 88.8 kg   SpO2 98%   BMI 32.58 kg/m  Discharge exam:  Gen: WNWD female resting in bed in NAD CV:RRR, no m/r/g Resp: lungs CTAB, speaking in full sentences, normal WOB on room air YO:VZCH, non-distended, mildly tender to palpation, normal bowel sounds Skin: normal skin turgor, warm and dry Neuro: Alert oriented answering questions appropriately Psych: normal affect  Pertinent Labs, Studies, and Procedures:  CT ABDOMEN PELVIS W CONTRAST  Result Date: 03/10/2021 CLINICAL DATA:  Abdominal pain, fever, epigastric pain EXAM: CT ABDOMEN AND PELVIS WITH CONTRAST TECHNIQUE: Multidetector CT imaging of the abdomen and pelvis was performed using the standard protocol following bolus administration of intravenous contrast. CONTRAST:  123mL OMNIPAQUE IOHEXOL 300 MG/ML  SOLN COMPARISON:  None. FINDINGS: Lower chest: No acute abnormality. Hepatobiliary: Moderate hepatic steatosis. Probable focal fatty sparing within the medial aspect of segment 4. Focal fatty infiltration along the falciform ligament. There are at least 3 enhancing masses  within the right hepatic lobe measuring up to 14 mm best seen on image # 25/4. These are nonspecific but may represent small hemangioma, adenoma, or focal nodular hyperplasia in a patient of this age. 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 Cholecystectomy has been performed. Mild extrahepatic biliary ductal dilation likely reflects post cholecystectomy change. Pancreas: Unremarkable Spleen: Unremarkable Adrenals/Urinary Tract: Adrenal glands are unremarkable. The kidneys are normal in size and position. A macroscopic fat containing exophytic mass arises from the lateral lower pole of the left kidney in keeping with a angiomyolipoma measuring 3 cm  in maximal dimension. The kidneys are otherwise unremarkable. Bladder is unremarkable. Stomach/Bowel: Stomach is within normal limits. Appendix appears normal. No evidence of bowel wall thickening, distention, or inflammatory changes. Vascular/Lymphatic: Retroaortic left renal vein. Abdominal vasculature otherwise unremarkable. No pathologic adenopathy. Reproductive: Uterus and bilateral adnexa are unremarkable. Other: No abdominal wall hernia or abnormality. No abdominopelvic ascites. 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 No other lytic or blastic bone lesion identified. No acute bone abnormality. IMPRESSION: No acute intra-abdominal pathology identified. No definite radiographic explanation for the patient's reported symptoms. Moderate hepatic steatosis. Multiple indeterminate enhancing masses within the right hepatic lobe. See differential considerations above. Dedicated contrast enhanced MRI examination would be more helpful for further characterization. 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 Comparison with prior examinations, if available, would be helpful in determining chronicity. Electronically Signed   By: Helyn Numbers M.D.   On: 03/10/2021 04:14   MR LIVER W WO CONTRAST  Result Date: 03/11/2021 CLINICAL DATA:  Liver lesions on CT. EXAM: MRI ABDOMEN WITHOUT AND WITH CONTRAST TECHNIQUE: Multiplanar multisequence MR imaging of the abdomen was performed both before and after the administration of intravenous contrast. CONTRAST:  54mL GADAVIST GADOBUTROL 1 MMOL/ML IV SOLN COMPARISON:  CT scan 03/10/2021 FINDINGS: Lower chest: Unremarkable Hepatobiliary: Diffuse loss of signal intensity in the liver parenchyma on out of phase T1 imaging  is compatible with fatty deposition. Scattered areas of subcapsular fatty sparing are noted in both hepatic lobes. Liver measures 18.5 cm craniocaudal length, enlarged. At least 9 tiny hypervascular lesions are seen scattered throughout both hepatic lobes, although difficult to characterize given tiny size, lesions appear to be relatively isointense to background liver parenchyma on non-fat suppressed pre contrast T1 imaging and show less loss of signal than background liver parenchyma on out of phase T1 imaging. Lesion shows subtle intermediate to high signal intensity on T2 imaging. Generally, imaging characteristics are nonspecific but likely benign. Lesions range in size from 3 mm up to 15 mm. Gallbladder surgically absent. No intrahepatic or extrahepatic biliary dilation. Pancreas: No focal mass lesion. No dilatation of the main duct. No intraparenchymal cyst. No peripancreatic edema. Spleen:  No splenomegaly. No focal mass lesion. Adrenals/Urinary Tract: No adrenal nodule or mass. Right kidney unremarkable. Susceptibility artifact from surgical clip noted adjacent to the upper pole right kidney. 2.8 x 1.9 x 2.4 cm angiomyolipoma noted lower pole left kidney. Stomach/Bowel: Stomach is unremarkable. No gastric wall thickening. No evidence of outlet obstruction. No small bowel or colonic dilatation within the visualized abdomen. Vascular/Lymphatic: No abdominal aortic aneurysm. No abdominal aortic atherosclerotic calcification. There is no gastrohepatic or hepatoduodenal ligament lymphadenopathy. No retroperitoneal or mesenteric lymphadenopathy. Other:  No intraperitoneal free fluid. Musculoskeletal: No focal suspicious marrow enhancement within the visualized bony anatomy. IMPRESSION: 1. At least 9 tiny hypervascular lesions are seen scattered throughout both hepatic lobes. While these are difficult to characterize given tiny size and imaging characteristics are nonspecific, findings are likely benign.  Differential considerations would include multiple tiny hemangiomas and tiny vascular malformations. Imaging features are not typical for [place] although adenoma is a possibility. Follow-up MRI in 3-6 months recommended to ensure stability. 2. Hepatomegaly and fatty deposition. 3. Left renal angiomyolipoma. Electronically Signed   By: Misty Stanley M.D.   On: 03/11/2021 07:18     CBC Latest Ref Rng & Units 03/12/2021 03/11/2021 03/10/2021  WBC 4.0 - 10.5 K/uL 7.6 7.2 10.3  Hemoglobin 12.0 - 15.0 g/dL 10.6(L) 11.4(L) 11.3(L)  Hematocrit 36.0 - 46.0 % 30.9(L) 33.5(L) 32.7(L)  Platelets 150 - 400 K/uL 378 366 409(H)   BMP Latest Ref Rng & Units 03/12/2021 03/11/2021 03/10/2021  Glucose 70 - 99 mg/dL 113(H) 120(H) 112(H)  BUN 6 - 20 mg/dL $Remove'7 6 10  'eolyStR$ Creatinine 0.44 - 1.00 mg/dL 0.99 1.04(H) 1.01(H)  BUN/Creat Ratio 9 - 23 - - -  Sodium 135 - 145 mmol/L 137 137 136  Potassium 3.5 - 5.1 mmol/L 3.1(L) 3.5 3.0(L)  Chloride 98 - 111 mmol/L 99 100 100  CO2 22 - 32 mmol/L $RemoveB'28 27 27  'vBswHWff$ Calcium 8.9 - 10.3 mg/dL 7.6(L) 8.0(L) 8.0(L)    Discharge Instructions: Discharge Instructions     Call MD for:  persistant dizziness or light-headedness   Complete by: As directed    Call MD for:  persistant nausea and vomiting   Complete by: As directed    Call MD for:  temperature >100.4   Complete by: As directed    Diet - low sodium heart healthy   Complete by: As directed    Increase activity slowly   Complete by: As directed        Signed: Scarlett Presto, MD 03/12/2021, 10:27 AM   Pager: [phone]

## 2021-03-12 NOTE — Discharge Instructions (Addendum)
Theresa Carter  You were recently admitted to [facility] for diarrhea and dehydration. We checked your electrolyte levels and gave you fluids. As the amount of diarrhea you were having decreased and you were able to eat and drink more your electrolytes improved and your kidneys were functioning normally upon discharge. We recommend that you continue drinking a lot of fluids and try to stay hydrated. We think you had some sort of virus and you should continue to feel better in the next couple of days. Come see [place] tomorrow in clinic and we can check to see that you continue to improve.  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 There is nothing to do for this and it will likely have no impact on your health but we do recommend that you discuss this with [place] in clinic. We may want to get a repeat picture taken in 3-6 months just to confirm it is stable and isn't changing.  2. Diarrhea- continue drinking fluids and eating. We will check your electrolytes again tomorrow.  If you have lightheadedness, dizziness, worsening diarrhea, or vomiting with the inability to keep down liquids we recommend that you seek further care.   We hope you continue to feel better and it was a pleasure taking care of you.

## 2021-03-12 NOTE — Plan of Care (Signed)
  Problem: Health Behavior/Discharge Planning: Goal: Ability to manage health-related needs will improve Outcome: Progressing   Problem: Clinical Measurements: Goal: Will remain free from infection Outcome: Progressing   Problem: Nutrition: Goal: Adequate nutrition will be maintained Outcome: Progressing   Problem: Pain Managment: Goal: General experience of comfort will improve Outcome: Progressing   

## 2021-03-13 ENCOUNTER — Encounter: Payer: Self-pay | Admitting: Internal Medicine

## 2021-03-13 ENCOUNTER — Other Ambulatory Visit: Payer: Self-pay

## 2021-03-13 ENCOUNTER — Ambulatory Visit (INDEPENDENT_AMBULATORY_CARE_PROVIDER_SITE_OTHER): Payer: Medicare HMO | Admitting: Internal Medicine

## 2021-03-13 VITALS — BP 139/87 | HR 94 | Temp 98.7°F | Ht 65.0 in | Wt 196.7 lb

## 2021-03-13 DIAGNOSIS — I10 Essential (primary) hypertension: Secondary | ICD-10-CM | POA: Diagnosis not present

## 2021-03-13 DIAGNOSIS — R42 Dizziness and giddiness: Secondary | ICD-10-CM

## 2021-03-13 DIAGNOSIS — R110 Nausea: Secondary | ICD-10-CM

## 2021-03-13 DIAGNOSIS — R197 Diarrhea, unspecified: Secondary | ICD-10-CM

## 2021-03-13 DIAGNOSIS — E119 Type 2 diabetes mellitus without complications: Secondary | ICD-10-CM

## 2021-03-13 DIAGNOSIS — E876 Hypokalemia: Secondary | ICD-10-CM | POA: Diagnosis not present

## 2021-03-13 MED ORDER — LOSARTAN POTASSIUM 100 MG PO TABS: 100.0000 mg | ORAL_TABLET | Freq: Every day | ORAL | 1 refills | Status: DC

## 2021-03-13 MED ORDER — SEMAGLUTIDE (1 MG/DOSE) 4 MG/3ML ~~LOC~~ SOPN: 1.0000 mg | PEN_INJECTOR | SUBCUTANEOUS | 2 refills | Status: DC

## 2021-03-13 NOTE — Progress Notes (Signed)
   CC: Acute diarrheal illness follow up  HPI:  [name] is a 50 y.o. with a PMHx as listed below who presents to the clinic for Acute diarrheal illness follow up.   Please see the Encounters tab for problem-based Assessment & Plan regarding status of patient's acute and chronic conditions.  Past Medical History:  Diagnosis Date   Asthma    Chest pain 07/29/2018   Diabetes mellitus without complication ([place])    Ectopic pregnancy    Hypertension    Review of Systems: Review of Systems  Constitutional:  Positive for malaise/fatigue and weight loss. Negative for chills and fever.  Eyes:  Negative for blurred vision and double vision.  Respiratory:  Negative for cough and shortness of breath.   Cardiovascular:  Negative for chest pain and leg swelling.  Gastrointestinal:  Positive for abdominal pain and nausea. Negative for blood in stool, constipation, diarrhea, heartburn, melena and vomiting.  Skin:  Negative for itching and rash.  Neurological:  Positive for dizziness (w/ exertion, position changes) and weakness (generalized). Negative for focal weakness and loss of consciousness.   Physical Exam:  Vitals:   03/13/21 1539  BP: 139/87  Pulse: 94  Temp: 98.7 F (37.1 C)  TempSrc: Oral  SpO2: 99%  Weight: 196 lb 11.2 oz (89.2 kg)  Height: 5\' 5"  (1.651 m)   Physical Exam Vitals and nursing note reviewed.  Constitutional:      General: She is not in acute distress.    Appearance: She is obese. She is ill-appearing (improved from last visit though).  HENT:     Head: Normocephalic and atraumatic.  Eyes:     General: No scleral icterus.    Extraocular Movements: Extraocular movements intact.     Conjunctiva/sclera: Conjunctivae normal.     Pupils: Pupils are equal, round, and reactive to light.  Cardiovascular:     Rate and Rhythm: Normal rate and regular rhythm.     Heart sounds: No murmur heard. Pulmonary:     Effort: Pulmonary effort is normal. No  respiratory distress.     Breath sounds: Normal breath sounds. No wheezing or rales.  Abdominal:     General: Bowel sounds are normal. There is no distension.     Palpations: Abdomen is soft.     Tenderness: There is abdominal tenderness (epigastric and peri-umbilical TTP). There is no guarding.  Musculoskeletal:     Right lower leg: No edema.     Left lower leg: No edema.  Skin:    General: Skin is warm and dry.  Neurological:     General: No focal deficit present.     Mental Status: She is alert and oriented to person, place, and time.  Psychiatric:        Mood and Affect: Mood normal.        Behavior: Behavior normal.        Thought Content: Thought content normal.        Judgment: Judgment normal.    Assessment & Plan:   See Encounters Tab for problem based charting.  Patient discussed with Dr.  Saverio Danker

## 2021-03-13 NOTE — Patient Instructions (Signed)
It was nice seeing you today! Thank you for choosing [facility] for your Primary Care.    Today we talked about:   High blood pressure with Dizziness: We are stopping HCTZ and continuing with only Losartan  Diarrhea:  We are checking blood work today and I will call you with the results  Diabetes: No medication changes today until we get you back to feeling better. Continue daily Metformin and weekly Ozempic

## 2021-03-14 DIAGNOSIS — R42 Dizziness and giddiness: Secondary | ICD-10-CM | POA: Insufficient documentation

## 2021-03-14 DIAGNOSIS — E876 Hypokalemia: Secondary | ICD-10-CM | POA: Insufficient documentation

## 2021-03-14 LAB — CBC
Hematocrit: 35.4 % (ref 34.0–46.6)
Hemoglobin: 12 g/dL (ref 11.1–15.9)
MCH: 30.8 pg (ref 26.6–33.0)
MCHC: 33.9 g/dL (ref 31.5–35.7)
MCV: 91 fL (ref 79–97)
Platelets: 440 10*3/uL (ref 150–450)
RBC: 3.89 x10E6/uL (ref 3.77–5.28)
RDW: 12.6 % (ref 11.7–15.4)
WBC: 9.9 10*3/uL (ref 3.4–10.8)

## 2021-03-14 LAB — BMP8+ANION GAP
Anion Gap: 16 mmol/L (ref 10.0–18.0)
BUN/Creatinine Ratio: 9 (ref 9–23)
BUN: 7 mg/dL (ref 6–24)
CO2: 25 mmol/L (ref 20–29)
Calcium: 8.7 mg/dL (ref 8.7–10.2)
Chloride: 98 mmol/L (ref 96–106)
Creatinine, Ser: 0.77 mg/dL (ref 0.57–1.00)
Glucose: 110 mg/dL — ABNORMAL HIGH (ref 70–99)
Potassium: 3.8 mmol/L (ref 3.5–5.2)
Sodium: 139 mmol/L (ref 134–144)
eGFR: 95 mL/min/{1.73_m2} (ref >59)

## 2021-03-14 LAB — MAGNESIUM: Magnesium: 1.3 mg/dL — ABNORMAL LOW (ref 1.6–2.3)

## 2021-03-14 MED ORDER — PROMETHAZINE HCL 12.5 MG PO TABS: 12.5000 mg | ORAL_TABLET | Freq: Four times a day (QID) | ORAL | 0 refills | Status: DC | PRN

## 2021-03-14 NOTE — Assessment & Plan Note (Signed)
Theresa Carter has restarted her home losartan-HCTZ and is concerned that HCTZ is contributing to her dizziness.    Assessment/plan: Given that patient is experiencing hypokalemia/hypomagnesemia with her dizziness, will discontinue HCTZ at this time.  We discussed that she will likely need an additional agent in the future to compensate for the lack of HCTZ.  - Discontinue losartan-HCTZ - Start only losartan 100 mg daily - 1 week follow-up for BP recheck

## 2021-03-14 NOTE — Assessment & Plan Note (Addendum)
Likely secondary to GI losses in the setting of recent diarrhea illness. Repeat labs demonstrate persistently low magnesium at 1.3. Will start with oral supplementation.   - Start Magnesium Chloride  - Hold Omeprazole  - Recheck in 1 week

## 2021-03-14 NOTE — Assessment & Plan Note (Signed)
Theresa Carter endorses significant exertional dizziness and is also present when shifting positions, such as from laying down to sitting.  She denies any loss of consciousness.  She denies any vision changes.  Assessment/plan: Suspect this is deconditioning secondary to acute GI illness that is now lasted well over 10 days and resulted in hospitalization.  We discussed slowly increasing her exertion tolerance and working to keep her self hydrated.  Orthostatics were obtained today and negative, which is an improvement.

## 2021-03-14 NOTE — Assessment & Plan Note (Addendum)
Patient was recently hospitalized for an acute diarrheal illness from November 17 through November 19.  Work-up included C. difficile and GI pathogen panel, both negative.  CT abdomen/pelvis with and without contrast was obtained and negative for acute intra-abdominal abnormality that would explain patient's symptoms.  Fecal lactoferrin was obtained and negative as well making inflammatory colitis less likely.  Patient was treated with IV fluids, IV electrolyte replacement and antiemetics.  Today, Mrs. Hunton states that she has not had diarrhea in approximately 36 hours, but she continues to have significant nausea and epigastric and periumbilical abdominal pain.  She denies any additional episodes of vomiting.   Assessment/plan: Etiology of diarrheal illness uncertain at this time but appears to be improving.  Continue with symptomatic management.  - Refilled Phenergan, use every 6 hours as needed - Continue working on adequate fluid intake  Addendum: When I contacted Mrs. Cordle to discuss her lab results, she states diarrhea has restarted.  She had 2 episodes yesterday 3 episodes so far today.  She denies any other changes in her symptoms.  I recommended that she decrease her metformin dose to only 500 once daily (she restarted her prior home dose).  I recommended she follow-up in 1 week.  Mrs. Seel states she will contact the clinic to schedule this appointment.

## 2021-03-14 NOTE — Assessment & Plan Note (Signed)
Due to GI losses in the setting of recent diarrhea illness. Received both PO and IV potassium supplementation during recent hospitalization. Resolved at this time.

## 2021-03-14 NOTE — Assessment & Plan Note (Signed)
Theresa Carter states that she has restarted her Ozempic and metformin.  She restarted at her prior dose of the 1000 mg twice daily.  Assessment/plan: Patient is not due for an A1c at this time.  Continue with current medication management.

## 2021-03-15 NOTE — Progress Notes (Signed)
Internal Medicine Clinic Attending  Case discussed with Dr. Basaraba  At the time of the visit.  We reviewed the resident's history and exam and pertinent patient test results.  I agree with the assessment, diagnosis, and plan of care documented in the resident's note.  

## 2021-03-20 DIAGNOSIS — Z9189 Other specified personal risk factors, not elsewhere classified: Secondary | ICD-10-CM

## 2021-03-20 NOTE — Progress Notes (Signed)
[facility])                                            [place] Team                                        Statin Quality Measure Assessment    03/20/2021  Morningstar Toft Dec 27, 1970 [id number]  Per review of chart and payor information, this patient has been flagged for non-adherence to the following CMS Quality Measure:   [x]  Statin Use in Persons with Diabetes  []  Statin Use in Persons with Cardiovascular Disease  The 10-year ASCVD risk score (Arnett DK, et al., 2019) is: 6%   Values used to calculate the score:     Age: 50 years     Sex: Female     Is Non-Hispanic African American: No     Diabetic: Yes     Tobacco smoker: No     Systolic Blood Pressure: 254 mmHg     Is BP treated: Yes     HDL Cholesterol: 39 mg/dL     Total Cholesterol: 216 mg/dL      LDL 123 mg/dL as of 01/19/2021  Per chart review, no statin on file or mention of intolerance. Currently, patient is not prescribed an antihyperlipidemic medication. ASCVD risk <7.5%. If deemed therapeutically appropriate, please consider assessment for the need of statin given LDL.   Next appointment w/PCP: 03/21/21   Please consider ONE of the following recommendations:   Initiate high intensity statin Atorvastatin 40mg  once daily, #90, 3 refills   Rosuvastatin 20mg  once daily, #90, 3 refills    Initiate moderate intensity          statin with reduced frequency if prior          statin intolerance 1x weekly, #13, 3 refills   2x weekly, #26, 3 refills   3x weekly, #39, 3 refills    Code for past statin intolerance or other exclusions (required annually)  Drug Induced Myopathy G72.0   Myositis, unspecified M60.9   Rhabdomyolysis M62.82   Prediabetes R73.03   Adverse effect of antihyperlipidemic and antiarteriosclerotic drugs, initial encounter Y70.6C3J    Thank you for your time,  Kristeen Miss, [place] Cell: [phone]

## 2021-03-21 ENCOUNTER — Other Ambulatory Visit: Payer: Self-pay | Admitting: Internal Medicine

## 2021-03-21 ENCOUNTER — Ambulatory Visit (INDEPENDENT_AMBULATORY_CARE_PROVIDER_SITE_OTHER): Payer: Medicare HMO | Admitting: Internal Medicine

## 2021-03-21 ENCOUNTER — Other Ambulatory Visit: Payer: Self-pay

## 2021-03-21 VITALS — BP 153/103 | HR 90 | Temp 98.3°F | Ht 65.0 in | Wt 200.8 lb

## 2021-03-21 DIAGNOSIS — R197 Diarrhea, unspecified: Secondary | ICD-10-CM

## 2021-03-21 DIAGNOSIS — R42 Dizziness and giddiness: Secondary | ICD-10-CM | POA: Diagnosis not present

## 2021-03-21 DIAGNOSIS — D539 Nutritional anemia, unspecified: Secondary | ICD-10-CM | POA: Diagnosis not present

## 2021-03-21 DIAGNOSIS — I10 Essential (primary) hypertension: Secondary | ICD-10-CM | POA: Diagnosis not present

## 2021-03-21 DIAGNOSIS — E782 Mixed hyperlipidemia: Secondary | ICD-10-CM | POA: Diagnosis not present

## 2021-03-21 DIAGNOSIS — E119 Type 2 diabetes mellitus without complications: Secondary | ICD-10-CM

## 2021-03-21 DIAGNOSIS — R413 Other amnesia: Secondary | ICD-10-CM | POA: Diagnosis not present

## 2021-03-21 MED ORDER — MONTELUKAST SODIUM 10 MG PO TABS: 10.0000 mg | ORAL_TABLET | Freq: Every day | ORAL | 3 refills | Status: DC

## 2021-03-21 NOTE — Progress Notes (Signed)
   CC: Diarrhea follow up  HPI:  Theresa Carter is a 50 y.o. with a PMHx as listed below who presents to the clinic for Diarrhea follow up.   Please see the Encounters tab for problem-based Assessment & Plan regarding status of patient's acute and chronic conditions.  Past Medical History:  Diagnosis Date   Asthma    Chest pain 07/29/2018   Diabetes mellitus without complication ([place])    Ectopic pregnancy    Hypertension    Review of Systems: Review of Systems  Constitutional:  Negative for chills, fever, malaise/fatigue and weight loss.  Gastrointestinal:  Positive for abdominal pain (minimal, epigastric). Negative for nausea and vomiting.  Neurological:  Positive for dizziness (improving gradually). Negative for focal weakness and weakness.   Physical Exam:  Vitals:   03/21/21 1434  BP: (!) 153/103  Pulse: 90  Temp: 98.3 F (36.8 C)  TempSrc: Oral  SpO2: 100%  Weight: 200 lb 12.8 oz (91.1 kg)  Height: 5\' 5"  (1.651 m)   Physical Exam Vitals and nursing note reviewed.  Constitutional:      General: She is not in acute distress.    Appearance: She is obese.  HENT:     Head: Normocephalic and atraumatic.  Pulmonary:     Effort: Pulmonary effort is normal. No respiratory distress.  Abdominal:     General: Bowel sounds are normal. There is no distension.     Palpations: Abdomen is soft. There is no mass.     Tenderness: There is no abdominal tenderness. There is no guarding or rebound.  Skin:    General: Skin is warm and dry.  Neurological:     General: No focal deficit present.     Mental Status: She is alert and oriented to person, place, and time. Mental status is at baseline.     Gait: Gait normal.  Psychiatric:        Mood and Affect: Mood normal.        Behavior: Behavior normal.        Thought Content: Thought content normal.        Judgment: Judgment normal.    Assessment & Plan:   See Encounters Tab for problem based charting.  Patient  discussed with Dr. Heber [place]

## 2021-03-22 DIAGNOSIS — E785 Hyperlipidemia, unspecified: Secondary | ICD-10-CM | POA: Insufficient documentation

## 2021-03-22 DIAGNOSIS — L732 Hidradenitis suppurativa: Secondary | ICD-10-CM | POA: Diagnosis not present

## 2021-03-22 DIAGNOSIS — L910 Hypertrophic scar: Secondary | ICD-10-CM | POA: Diagnosis not present

## 2021-03-22 LAB — VITAMIN B12: Vitamin B-12: 442 pg/mL (ref 232–1245)

## 2021-03-22 LAB — MAGNESIUM: Magnesium: 1.4 mg/dL — ABNORMAL LOW (ref 1.6–2.3)

## 2021-03-22 MED ORDER — ROSUVASTATIN CALCIUM 5 MG PO TABS: 5.0000 mg | ORAL_TABLET | Freq: Every day | ORAL | 1 refills | Status: DC

## 2021-03-22 MED ORDER — FAMOTIDINE 20 MG PO TABS: 20.0000 mg | ORAL_TABLET | Freq: Two times a day (BID) | ORAL | 1 refills | Status: DC | PRN

## 2021-03-22 NOTE — Assessment & Plan Note (Signed)
Theresa Carter states that for at least the last 6 months, she has noticed difficulty finding her words at times.  She describes needing to tell her insurance her medication name.  Although she normally can list all her medications out she cannot recall the name of her diabetic med.  She states this happens several times a week.  She notes that there is quite a bit of stress in her household but it has not changed over the last 6 months.  She denies any other memory deficits.   Assessment/plan: I suspect lethologica is secondary to increased stressors. No other focal symptoms. Very low suspicion for CVA. Will check B12 given patient is on long term Metformin.   - B12 level pending  ADDENDUM:  B12 level is WNL.  No further intervention at this time but will continue to monitor closely

## 2021-03-22 NOTE — Assessment & Plan Note (Addendum)
BP: (!) 153/103   Blood pressure elevated at her visit today and we did not have the chance to recheck it.  We did not focus on hypertension today specifically due to competing priorities, however I did contact [name] via telephone the day after her appointment.  She will start using her home BP cuff and keep a log.  - Continue losartan 100 mg daily - Follow-up in 4 weeks with home BP log

## 2021-03-22 NOTE — Assessment & Plan Note (Addendum)
The 10-year ASCVD risk score (Arnett DK, et al., 2019) is: 7.2%   Values used to calculate the score:     Age: 50 years     Sex: Female     Is Non-Hispanic African American: No     Diabetic: Yes     Tobacco smoker: No     Systolic Blood Pressure: 022 mmHg     Is BP treated: Yes     HDL Cholesterol: 39 mg/dL     Total Cholesterol: 216 mg/dL  Lifetime risk: 50%  - After shared decision-making with Theresa Carter, we decided to start a moderate intensity statin.  Rosuvastatin 5 mg daily sent to pharmacy

## 2021-03-22 NOTE — Assessment & Plan Note (Signed)
Theresa Carter states that she continues to take metformin 500 mg daily only after developing diarrhea when she started back at her prior dose.  She is also taking Ozempic 1 mg weekly without any difficulty.  Assessment/plan: - Not due for A1c at this time - Continue Ozempic 1 mg weekly at this time.  We briefly discussed switching due to nationwide backorder of Ozempic, however her pharmacy continues to have it at this time so we will hold off on any changes. - Patient increase metformin to 500 mg twice daily.  Provided titration instructions with goal dose of 1000 mg twice daily

## 2021-03-22 NOTE — Assessment & Plan Note (Signed)
Mrs. Defino states that her dizziness is gradually improving.  She is able to tolerate more endurance than she could before.  Assessment/plan: Given patient's endurance is improving and dizziness only occurs with significant exertion now, feel it is likely secondary to deconditioning.  We will continue to monitor as patient recovers from recent diarrheal illness.

## 2021-03-22 NOTE — Assessment & Plan Note (Signed)
Theresa Carter states that she picked up magnesium oxide from the pharmacy and is taking 2 tablets daily without any difficulty.  She denies any diarrhea with starting the medication.  She has stopped taking Prilosec for the most part but did take it 1 time when she had severe epigastric abdominal pain.  Assessment/plan: Hypomagnesemia felt to be secondary to diarrheal illness.  Potentially exacerbated by PPI use.  -Repeat magnesium level pending - Continue daily magnesium oxide supplementation  ADDENDUM: Magnesium level continues to be low but improving very slowly from 1.3-1.4.  Discussed with Theresa Carter that she will need to continue holding Prilosec and increase her daily magnesium oxide dose to 3 to 4 tablets daily (as tolerated).  She expressed understanding.  We will follow-up in 4 weeks to repeat levels.  If it continues to be low at that time we will need to continue work-up for other etiology

## 2021-03-22 NOTE — Assessment & Plan Note (Signed)
Resolved at this time. No further intervention indicated.

## 2021-03-24 NOTE — Progress Notes (Signed)
Internal Medicine Clinic Attending  Case discussed with Dr. Basaraba  At the time of the visit.  We reviewed the resident's history and exam and pertinent patient test results.  I agree with the assessment, diagnosis, and plan of care documented in the resident's note.  

## 2021-03-29 ENCOUNTER — Ambulatory Visit (INDEPENDENT_AMBULATORY_CARE_PROVIDER_SITE_OTHER): Payer: Medicare HMO | Admitting: Dietician

## 2021-03-29 ENCOUNTER — Encounter: Payer: Self-pay | Admitting: Dietician

## 2021-03-29 DIAGNOSIS — E119 Type 2 diabetes mellitus without complications: Secondary | ICD-10-CM

## 2021-03-29 NOTE — Progress Notes (Signed)
Medical Nutrition Therapy:  Appt start time: 1010 end time:  1040 Total time: 30 Visit # 4  Assessment:  Primary concerns today: desires weight loss. She has lost 18# in past 12 weeks which is ~1-2 # per week overall since working with RDN(12/29/20). She had a hospitalization since her last visit for severe dehydration from Nausea/vomiting and diarrhea which is now resolved . Her potassium and magnesium were depleted and she is taking supplements.  her weight is what we think is her baseline. She is back on 1 mg Ozempic and metformin. She continues to self monitor by weighing daily at home.  She states again that she is planning to start working out at a gym but also applying for fulltime jobs. She is happy with her weight loss. Her intake is adequate and basck to her baseline.   Her goal: to weigh 150# by June 2023.  ANTHROPOMETRICS: Estimated body mass index is 33.22 kg/m as calculated from the following:   Height as of 03/21/21: 5\' 5"  (1.651 m).   Weight as of this encounter: 199 lb 9.6 oz (90.5 kg).  WEIGHT HISTORY: Highest: 260# Lowest 208#  Wt Readings from Last 10 Encounters:  03/29/21 199 lb 9.6 oz (90.5 kg)  03/21/21 200 lb 12.8 oz (91.1 kg)  03/13/21 196 lb 11.2 oz (89.2 kg)  03/11/21 195 lb 12.3 oz (88.8 kg)  03/09/21 194 lb 4.8 oz (88.1 kg)  03/06/21 192 lb (87.1 kg)  02/14/21 206 lb 9.6 oz (93.7 kg)  01/19/21 208 lb 11.2 oz (94.7 kg)  12/29/20 (P) 217 lb 11.2 oz (98.7 kg)  12/08/20 219 lb 3.2 oz (99.4 kg)   MEDICATIONS: Ozempic 1 mg weekly, metformin  BLOOD SUGAR: she did not want to check her blood sugar today. Lab Results  Component Value Date   HGBA1C 7.4 (A) 01/19/2021   HGBA1C 7.2 (A) 10/05/2020   HGBA1C 7.4 (A) 06/14/2020   HGBA1C 7.1 (A) 02/23/2020   HGBA1C 6.7 (A) 09/16/2019    DIETARY INTAKE: Usual eating pattern includes 3 meals and 3 snacks per day. Everyday foods include fruits, vegetables, olive oil, starches.  .   Dining Out (times/week): seldom unless  traveling with her spouse on his truck 11 AM- [place] spicy chicken and half the fries, says this was not a good choice, did not eat the biscuit yesterday and today ate a smartone potato and broccoli & cheese meal 5 Pm Ziti smart one meal with glass of milk Fruit for snack  Beverages:2% milk 1-2 cups a day, no sugar Gatorade, diet ginger ale, tea with splenda propel, or hot tea with milk and sugar, coffee from [employer]  Usual physical activity: activities of daily living, minimal by her estimate and contemplative about change, refused offer to refer her to PREP as she plans to start next week at a local gym that takes silver sneakers  Estimated daily energy needs for weight loss: 1500-1750 cal/day, est needs to maintain 1950 calories for her goals per body weight planner: In order to maintain your current weight 2,358   Calories/day To reach her goal of 150 lbs in 272 days:  1,199   Calories/day   Progress Towards Goal(s):  Some progress.   Nutritional Diagnosis:  NB-1.1 Food and nutrition-related knowledge deficit As related to lack of sufficient training on meal planning and diabetes.  As evidenced by her report and her .stated readiness and a desire for weight loss and lack of progress despite medication that helps with weight loss.  Intervention:  Nutrition counseling and education about high potassium, high magnesium foods, importance of activity Action Goal: see patient instructions  Outcome goal: improved knowledge about weight loss Coordination of care:  await a1c, expect is has decreased with weight loss and lifestyle change, Consider increased dose of Ozempic per patient as needed  Teaching Method Utilized: Visual, Auditory,Hands on Handouts given during visit include:protein and diabetes,  After visit summary Barriers to learning/adherence to lifestyle change:  competing values Demonstrated degree of understanding via:  Teach Back   Monitoring/Evaluation:  Dietary  intake, exercise, and body weight in 6 week(s) [name], RD 03/29/2021 1:40 PM. .

## 2021-03-29 NOTE — Patient Instructions (Addendum)
Glad you are feeling better today!!!  Your foot exam, flu vaccine are due- can talk to DR. Basaraba about these.   Your weight is doing well. Intake sounds good.   Follow up: towards the end of January 2023. We'll contact you with the day and time once my schedule is put in.   Butch Penny [phone]

## 2021-03-30 LAB — HM DIABETES EYE EXAM

## 2021-04-18 ENCOUNTER — Telehealth: Payer: Self-pay | Admitting: Internal Medicine

## 2021-04-18 NOTE — Telephone Encounter (Signed)
Contacted patient to discuss her medication Ozempic. Patient is unable to afford her next refill till April 23, 2021. She will be using her last dose today. Offered Trulicity as a bridge till she can pick up her new prescription. Patient is in agreement. Sample for 2 weeks of Trulicity 1.5 mg once weekly labeled. Nurse Regino Schultze informed patient will be picking up prescription.

## 2021-04-19 NOTE — Progress Notes (Signed)
[facility]                                            [place] Team                                        Statin Quality Measure Assessment    04/19/2021  Marketia Stallsmith November 13, 1970 [id number]  Brief Note:  It was noted during a telephone follow up call  for rosuvastatin that the patient reported she had issues affording Ozempic as she in the Medicare coverage gap. I called the pharmacy and confirmed Ozempic was $257 for a 30 days supply. Her reported monthly income is $1000 and she was unable to verify spouse's income. Patient ran out of Trulicity. Given income, patient may qualify for PAP and/or LIS Extra Help. Dr. Charleen Kirks was informed of need for Trulicity sample (or other comparable GLP1-RA) to bridge until the end of this year and PAP. Referral placed to Marzetta Merino, PharmD and Hortencia Pilar, CPhT for medication assistance needs.    Kristeen Miss, PharmD Clinical Pharmacist [place] Cell: [phone]

## 2021-04-20 ENCOUNTER — Other Ambulatory Visit: Payer: Self-pay

## 2021-04-20 ENCOUNTER — Ambulatory Visit (INDEPENDENT_AMBULATORY_CARE_PROVIDER_SITE_OTHER): Payer: Medicare HMO | Admitting: Internal Medicine

## 2021-04-20 ENCOUNTER — Ambulatory Visit (HOSPITAL_COMMUNITY)
Admission: RE | Admit: 2021-04-20 | Discharge: 2021-04-20 | Disposition: A | Discharge status: Home / Self Care | Payer: Medicare HMO | Source: Ambulatory Visit | Attending: Internal Medicine | Admitting: Internal Medicine

## 2021-04-20 ENCOUNTER — Encounter: Payer: Self-pay | Admitting: Internal Medicine

## 2021-04-20 VITALS — BP 134/89 | HR 110 | Temp 98.1°F | Ht 65.0 in | Wt 195.8 lb

## 2021-04-20 DIAGNOSIS — R42 Dizziness and giddiness: Secondary | ICD-10-CM | POA: Diagnosis not present

## 2021-04-20 DIAGNOSIS — E119 Type 2 diabetes mellitus without complications: Secondary | ICD-10-CM | POA: Diagnosis not present

## 2021-04-20 DIAGNOSIS — R69 Illness, unspecified: Secondary | ICD-10-CM | POA: Diagnosis not present

## 2021-04-20 DIAGNOSIS — R000 Tachycardia, unspecified: Secondary | ICD-10-CM

## 2021-04-20 DIAGNOSIS — E782 Mixed hyperlipidemia: Secondary | ICD-10-CM | POA: Diagnosis not present

## 2021-04-20 DIAGNOSIS — F411 Generalized anxiety disorder: Secondary | ICD-10-CM

## 2021-04-20 DIAGNOSIS — I10 Essential (primary) hypertension: Secondary | ICD-10-CM

## 2021-04-20 LAB — POCT GLYCOSYLATED HEMOGLOBIN (HGB A1C): Hemoglobin A1C: 6.1 % — AB (ref 4.0–5.6)

## 2021-04-20 LAB — GLUCOSE, CAPILLARY: Glucose-Capillary: 151 mg/dL — ABNORMAL HIGH (ref 70–99)

## 2021-04-20 MED ORDER — SERTRALINE HCL 25 MG PO TABS: 25.0000 mg | ORAL_TABLET | Freq: Every day | ORAL | 2 refills | Status: DC

## 2021-04-20 NOTE — Addendum Note (Signed)
Addended by: Truddie Crumble on: 04/20/2021 12:24 PM   Modules accepted: Orders

## 2021-04-20 NOTE — Progress Notes (Signed)
° °  CC: T2DM; HTN; high heart rate  HPI:  Theresa Carter is a 50 y.o. with a PMHx as listed below who presents to the clinic for T2DM; HTN; high heart rate.   Please see the Encounters tab for problem-based Assessment & Plan regarding status of patient's acute and chronic conditions.  Past Medical History:  Diagnosis Date   Asthma    Chest pain 07/29/2018   Diabetes mellitus without complication ([facility])    Ectopic pregnancy    Hypertension    Review of Systems: Review of Systems  Constitutional:  Negative for chills, fever and weight loss.  Cardiovascular:  Positive for palpitations (intermittent). Negative for chest pain and leg swelling.  Gastrointestinal:  Negative for abdominal pain, constipation, diarrhea, nausea and vomiting.  Neurological:  Negative for dizziness, focal weakness and headaches.   Physical Exam:  Vitals:   04/20/21 1104  BP: 134/89  Pulse: (!) 110  Temp: 98.1 F (36.7 C)  TempSrc: Oral  SpO2: 98%  Weight: 195 lb 12.8 oz (88.8 kg)  Height: 5\' 5"  (1.651 m)   Physical Exam Vitals and nursing note reviewed.  Constitutional:      General: She is not in acute distress.    Appearance: She is obese.  Cardiovascular:     Rate and Rhythm: Regular rhythm. Tachycardia present.     Heart sounds: No murmur heard.   No gallop.  Pulmonary:     Effort: Pulmonary effort is normal. No respiratory distress.     Breath sounds: No wheezing or rales.  Abdominal:     General: Bowel sounds are normal.     Palpations: Abdomen is soft.  Skin:    General: Skin is warm and dry.  Neurological:     General: No focal deficit present.     Mental Status: She is alert and oriented to person, place, and time. Mental status is at baseline.  Psychiatric:        Mood and Affect: Mood normal.        Behavior: Behavior normal.        Thought Content: Thought content normal.        Judgment: Judgment normal.    Assessment & Plan:   See Encounters Tab for problem based  charting.  Patient discussed with Dr. Heber Todd

## 2021-04-20 NOTE — Patient Instructions (Addendum)
It was nice seeing you today! Thank you for choosing [facility] for your Primary Care.    Today we talked about:   Hypertension: Your blood pressure today is great. No medication changes made.   Type 2 Diabetes Mellitus: Your A1c was checked today and it has improved to 6.1%, which is amazing! Keep up the great work. No medication changes today.    High heart rate (Tachycardia): We checked an EKG today that shows your heart rhythm is normal, although your heart rate is high. None of your medications on your list today are contributing. I recommend working on increasing your exercise tolerance, as you may have become deconditioned with your recent illness.   Anxiety: We will start medication today with Zoloft. This medication can take up to 4-6 weeks to have an affect. Most common side effect is upset stomach, so I recommend taking with a meal.

## 2021-04-21 LAB — BMP8+ANION GAP
Anion Gap: 17 mmol/L (ref 10.0–18.0)
BUN/Creatinine Ratio: 19 (ref 9–23)
BUN: 19 mg/dL (ref 6–24)
CO2: 20 mmol/L (ref 20–29)
Calcium: 9.6 mg/dL (ref 8.7–10.2)
Chloride: 104 mmol/L (ref 96–106)
Creatinine, Ser: 1 mg/dL (ref 0.57–1.00)
Glucose: 150 mg/dL — ABNORMAL HIGH (ref 70–99)
Potassium: 4.3 mmol/L (ref 3.5–5.2)
Sodium: 141 mmol/L (ref 134–144)
eGFR: 69 mL/min/{1.73_m2} (ref >59)

## 2021-04-21 LAB — MAGNESIUM: Magnesium: 1.4 mg/dL — ABNORMAL LOW (ref 1.6–2.3)

## 2021-04-21 LAB — MICROALBUMIN / CREATININE URINE RATIO
Creatinine, Urine: 155.8 mg/dL
Microalb/Creat Ratio: 7 mg/g creat (ref 0–29)
Microalbumin, Urine: 11.4 ug/mL

## 2021-04-25 DIAGNOSIS — F411 Generalized anxiety disorder: Secondary | ICD-10-CM | POA: Insufficient documentation

## 2021-04-25 NOTE — Assessment & Plan Note (Signed)
-   Repeat magnesium level pending

## 2021-04-25 NOTE — Assessment & Plan Note (Signed)
Theresa Carter states that she has a long-term history of generalized anxiety disorder for which she was previously seeing Dr. Carolynne Edouard.  She has never been on medication for this in the past due to her hesitancy towards these medications.  She had a family member that was on Prozac and had a very poor reaction.  She is familiar with Zoloft but has never taken it herself.  After prolonged discussion today, she is in agreement to do trial of SSRI.  Assessment/plan: We will start a trial of SSRI for GAD.  If patient is unable to tolerate, could consider BuSpar.  - Start Zoloft 25 mg daily - Follow-up in 8 weeks

## 2021-04-25 NOTE — Assessment & Plan Note (Signed)
BP: 134/89   Theresa Carter states she is taking Losartan daily without difficulty.   Assessment/Plan:  BP improved today compared to prior visit. No medication changes made.   - Continue Losartan 100 mg daily.

## 2021-04-25 NOTE — Assessment & Plan Note (Signed)
Theresa Carter states that her dizziness has resolved.  No further intervention at this time.

## 2021-04-25 NOTE — Assessment & Plan Note (Addendum)
Theresa Carter states that she recently went to donate plasma and was told she was unable to to do so due to her elevated heart rate.  She notes that she remembers having elevated heart rate during her diarrheal illness a few weeks back but did not realize it was persisting.  She endorses occasional palpitations but that tend to happen when she is anxious and resolves with deep breathing.  She denies any unintentional weight loss, sweating.   Assessment/plan: On initial presentation, patient was tachycardia up to 116 but improved to 110.  EKG was obtained that shows sinus tachycardia.  No QT abnormalities noted.  On examination, she does not appear dehydrated.  No current medications on patient's list could be contributing.  Given recent prolonged diarrheal illness and subsequent downtime, I wonder if this may be an aspect of deconditioning.  On the other hand, patient does have a prolonged history of anxiety, however this is a chronic issue and unlikely to be contributing to heart rate acutely.  I recommended patient start increasing her exercise tolerance slowly and monitor her heart rate intermittently at home.  -Follow-up in 6 to 8 weeks

## 2021-04-25 NOTE — Assessment & Plan Note (Signed)
Theresa Carter states that she is doing well on Crestor daily.  We will plan to repeat her lipid panel at her next visit.

## 2021-04-25 NOTE — Assessment & Plan Note (Signed)
Lab Results  Component Value Date   HGBA1C 6.1 (A) 04/20/2021   Theresa Carter states that she is doing well on her current regimen.  For the last week, her Ozempic was replaced with a sample of Trulicity given that she was unable to afford her Ozempic, however her insurance will provide better coverage starting in 2023.  She denies any difficulty with Trulicity for now.  Assessment/plan: A1c significantly improved today to 6.1% with continued decrease in weight.  Continue with current regimen.  - Continue Ozempic 1 mg weekly - Continue metformin 1000 mg twice daily - A1c in 3 months

## 2021-04-26 DIAGNOSIS — L732 Hidradenitis suppurativa: Secondary | ICD-10-CM | POA: Diagnosis not present

## 2021-04-26 DIAGNOSIS — Z79899 Other long term (current) drug therapy: Secondary | ICD-10-CM | POA: Diagnosis not present

## 2021-04-28 ENCOUNTER — Other Ambulatory Visit (HOSPITAL_COMMUNITY): Payer: Self-pay

## 2021-04-28 ENCOUNTER — Telehealth: Payer: Self-pay

## 2021-04-28 DIAGNOSIS — Z596 Low income: Secondary | ICD-10-CM

## 2021-04-28 IMAGING — MG DIGITAL SCREENING BILAT W/ CAD
4 series · 4 of 4 positions shown · non-contrast
Comparison: None.

ACR Breast Density Category a: The breast tissue is almost entirely
fatty.

CLINICAL DATA: Screening.

EXAM:
DIGITAL SCREENING BILATERAL MAMMOGRAM WITH CAD

[R CC]
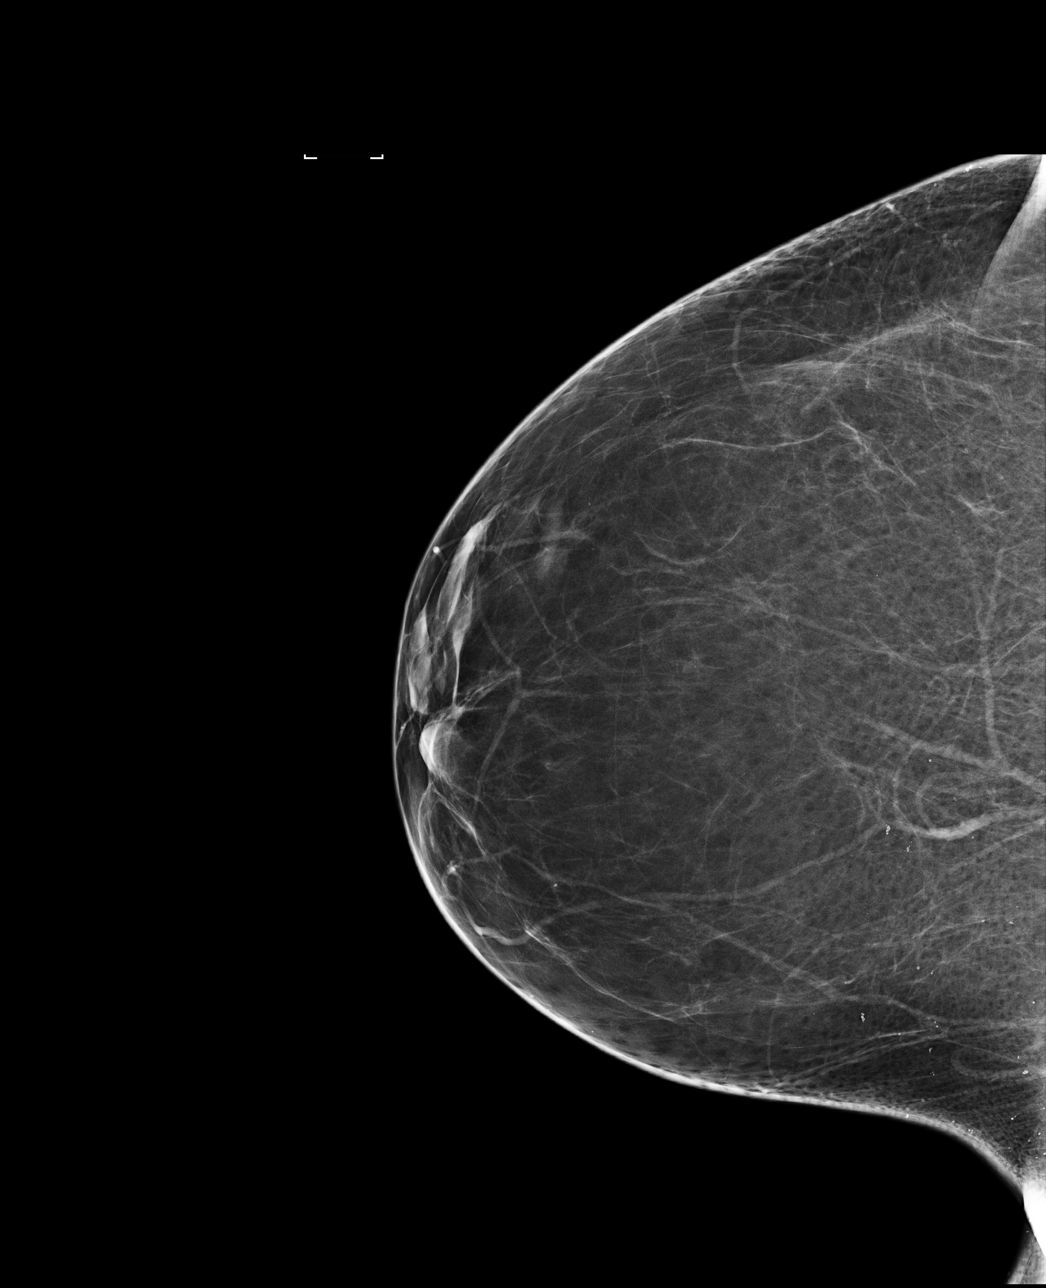

[R MLO]
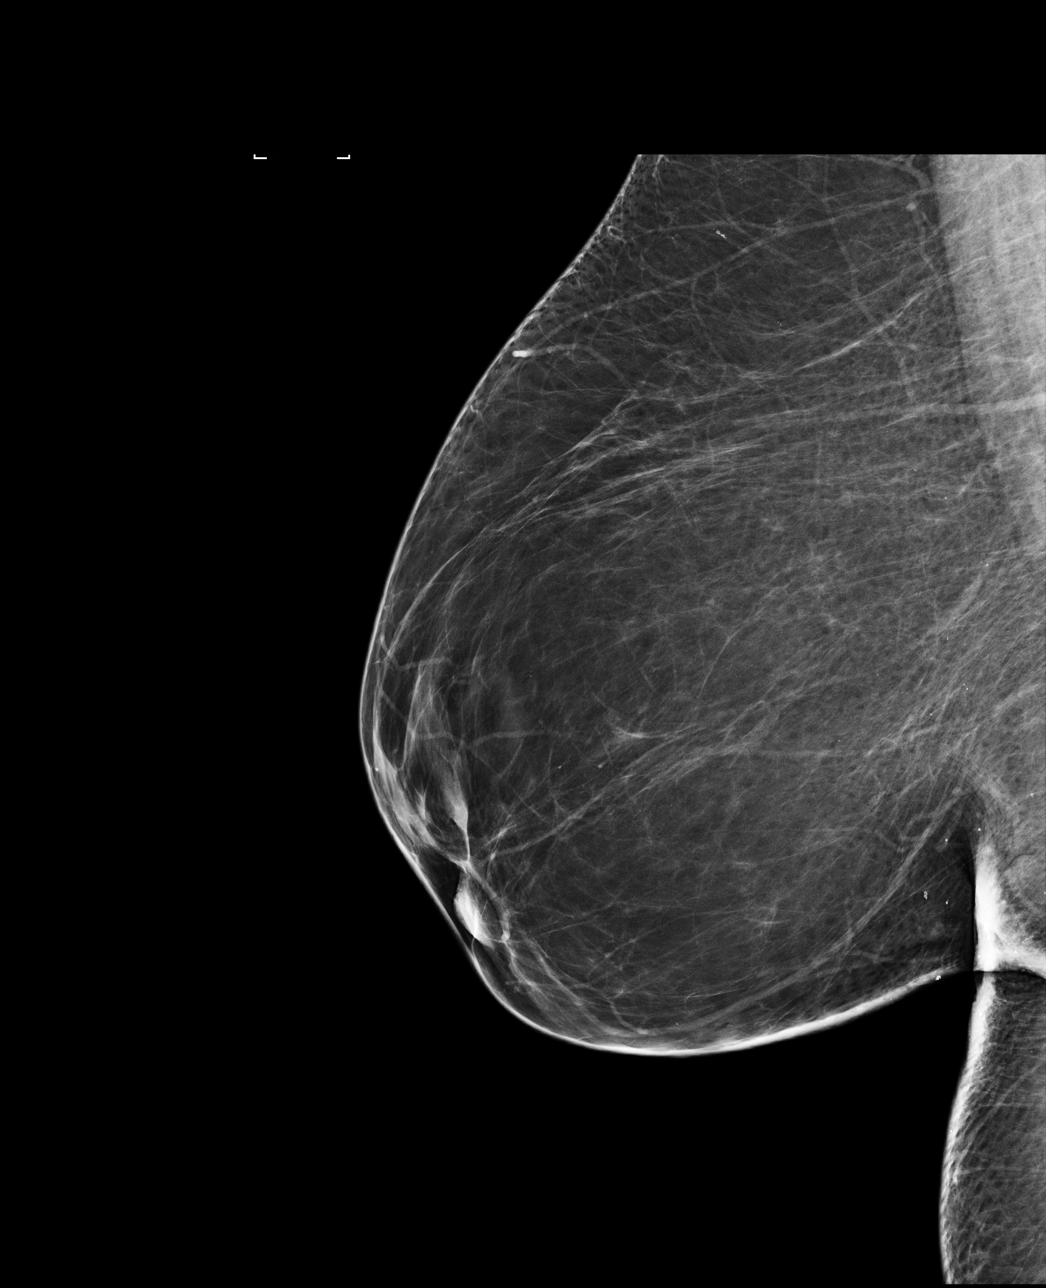

[L CC]
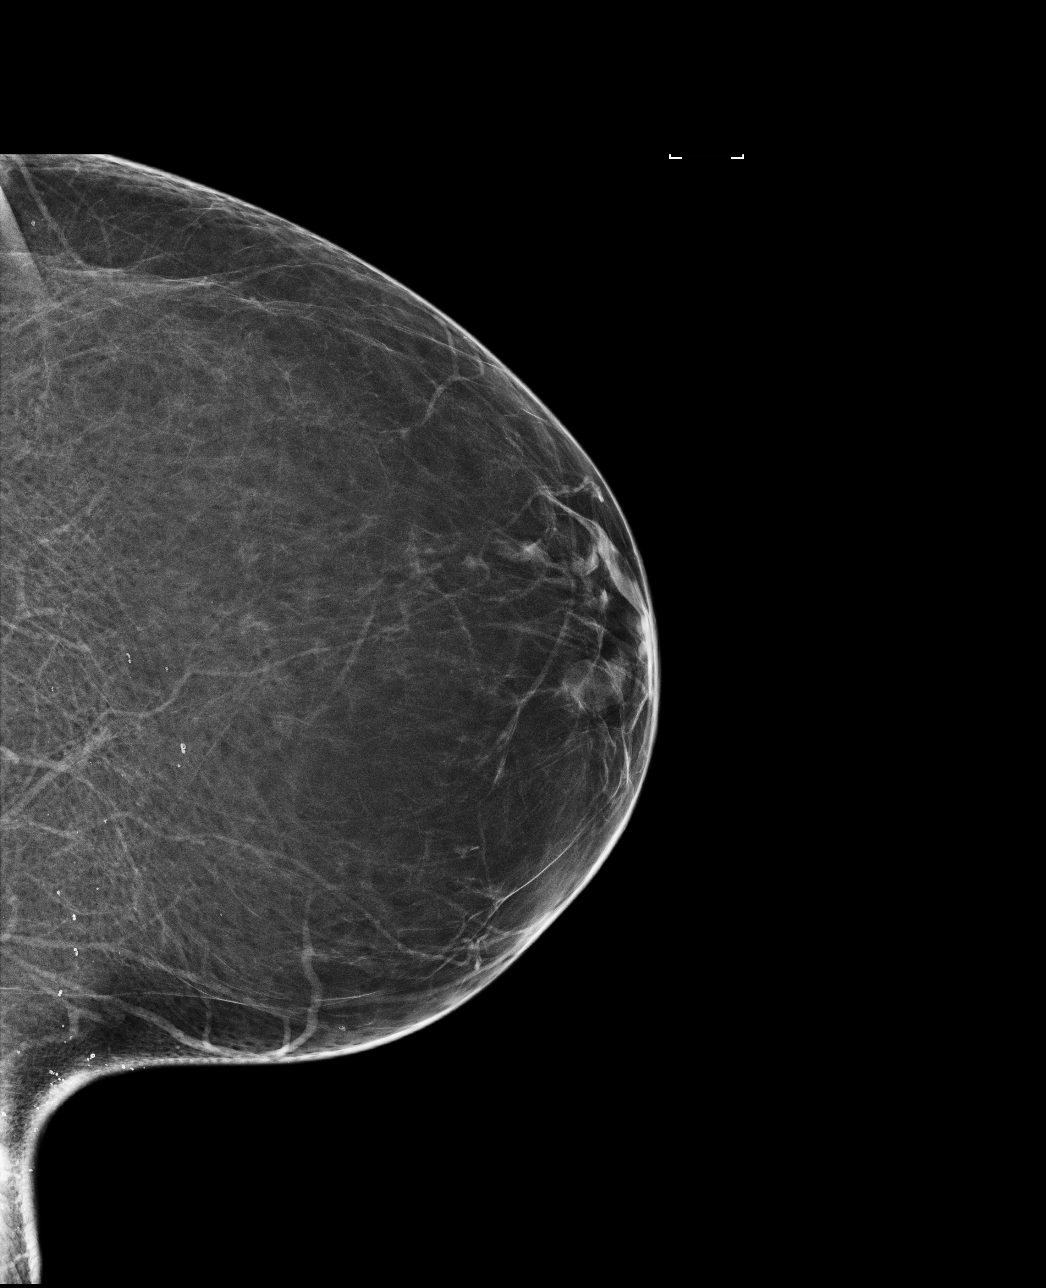

[L MLO]
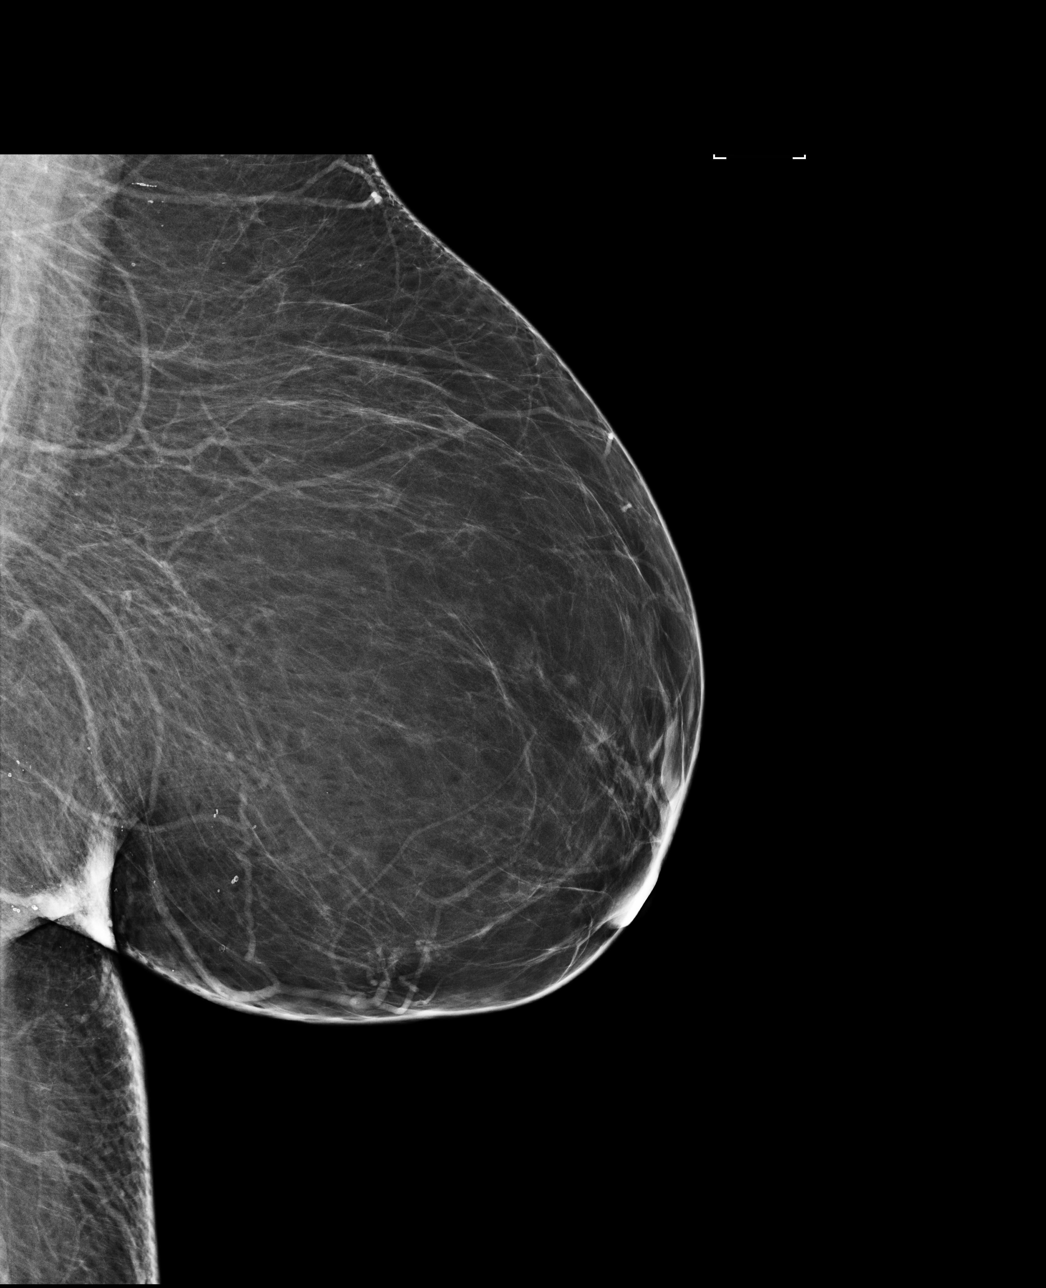

[4 of 4 positions shown; findings below may reference images not displayed]

FINDINGS: There are no findings suspicious for malignancy. Images were
processed with CAD.
IMPRESSION: No mammographic evidence of malignancy. A result letter of this
screening mammogram will be mailed directly to the patient.

RECOMMENDATION:
Screening mammogram in one year. (Code:[id number])

BI-RADS CATEGORY  1: Negative.

## 2021-04-28 NOTE — Progress Notes (Signed)
°                                         [place] [facility])                                            [place] Team                                        Statin Quality Measure Assessment    04/28/2021  Theresa Carter 1971/02/24 [id number]  Brief Note:  HIPAA identifiers confirmed.   I am a [facility] pharmacist and called patient today to follow up on completing LIS/EXTRA Help application. Application was completed with re-entry number: [id number]; patient is eligible to reapply until 06/27/2021 (patient was given this information). Of note, she noted that she was given 2-weeks supply of Trulicity (GLP-RA to bridge until Ozempic PAP is approved) and is completely out of medication. She is unable to afford Ozempic at this time, it cost $47, as she receives her disability the last Wednesday of each month. Essentially, she has to wait until the end of the month to pay for Ozempic. Patient may likely benefit from financial resources to help afford medications and other needs. Theresa Carter also stated she has not received information for PAP application for Ozempic. Will reach back out to Hortencia Pilar, CPhT regarding application and PCP for additional samples. Will reach out to Marzetta Merino, PharmD to help assist with medication assistance need.   If deemed clinically appropriate, please assess need for additional Trulicity samples     Theresa Carter, [place] Cell: [phone]

## 2021-05-01 ENCOUNTER — Encounter: Payer: Self-pay | Admitting: Internal Medicine

## 2021-05-01 NOTE — Progress Notes (Signed)
Internal Medicine Clinic Attending  Case discussed with Dr. Basaraba  At the time of the visit.  We reviewed the resident's history and exam and pertinent patient test results.  I agree with the assessment, diagnosis, and plan of care documented in the resident's note.  

## 2021-05-03 NOTE — Progress Notes (Signed)
°                                         [facility])                                            [place] Team                                        Statin Quality Measure Assessment    05/03/2021  Eilee Schader Nov 22, 1970 [id number]  Brief Note: HIPAA compliant message (see information below).   I called patient and was unable to reach. I left a voicemail informing patient previous sample is not available (medication name not given) and to resume medication  (did not state Ozempic) when she is able to afford towards the end of the month - as per discussion w/ Dr. Charleen Kirks. Patient was instructed to contact the clinic for more details. Of note, patient's hemoglobin A1c in December of last year was 6.1%.    Kristeen Miss, PharmD Clinical Pharmacist [place] Cell: [phone]

## 2021-05-04 ENCOUNTER — Other Ambulatory Visit: Payer: Self-pay | Admitting: Internal Medicine

## 2021-05-10 ENCOUNTER — Encounter: Payer: Self-pay | Admitting: Dietician

## 2021-05-10 ENCOUNTER — Ambulatory Visit (INDEPENDENT_AMBULATORY_CARE_PROVIDER_SITE_OTHER): Payer: Medicare HMO | Admitting: Dietician

## 2021-05-10 DIAGNOSIS — E119 Type 2 diabetes mellitus without complications: Secondary | ICD-10-CM

## 2021-05-10 NOTE — Progress Notes (Deleted)
Weight assessment-  Congrats on lowering her A1C.  Has she decided to get a full time job?   Has she started to exercise? Changes in food intake- 24 hour recall today How has appetite been on Trulicity?  Stress levels?   Have you heard about the LIS/EXTRA Help approval yet?  Eye exam- referral was resent today to Dr. Gershon Crane  Medical Nutrition Therapy:  Appt start time: 1012 end time:  1050 Total time: 38 Visit # 5  Assessment:  Primary concerns today: desires weight loss.  She has lost 18# in past 12 weeks which is ~1-2 # per week overall since working with RDN(12/29/20).   She had a hospitalization since her last visit for severe dehydration from Nausea/vomiting and diarrhea which is now resolved . Her potassium and magnesium were depleted and she is taking supplements.  her weight is what we think is her baseline. She is back on 1 mg Ozempic and metformin. She continues to self monitor by weighing daily at home.  She states again that she is planning to start working out at a gym but also applying for fulltime jobs. She is happy with her weight loss. Her intake is adequate and basck to her baseline.    Her goal: to weigh 150# by June 2023.  ANTHROPOMETRICS: Estimated body mass index is 31.98 kg/m as calculated from the following:   Height as of 04/20/21: 5\' 5"  (1.651 m).   Weight as of this encounter: 192 lb 3.2 oz (87.2 kg).  WEIGHT HISTORY: Highest: 260# Lowest 208#  Wt Readings from Last 10 Encounters:  05/10/21 192 lb 3.2 oz (87.2 kg)  04/20/21 195 lb 12.8 oz (88.8 kg)  03/29/21 199 lb 9.6 oz (90.5 kg)  03/21/21 200 lb 12.8 oz (91.1 kg)  03/13/21 196 lb 11.2 oz (89.2 kg)  03/11/21 195 lb 12.3 oz (88.8 kg)  03/09/21 194 lb 4.8 oz (88.1 kg)  03/06/21 192 lb (87.1 kg)  02/14/21 206 lb 9.6 oz (93.7 kg)  01/19/21 208 lb 11.2 oz (94.7 kg)   MEDICATIONS: Ozempic 1 mg weekly, metformin  BLOOD SUGAR: she did not want to check her blood sugar today. Lab Results  Component  Value Date   HGBA1C 6.1 (A) 04/20/2021   HGBA1C 7.4 (A) 01/19/2021   HGBA1C 7.2 (A) 10/05/2020   HGBA1C 7.4 (A) 06/14/2020   HGBA1C 7.1 (A) 02/23/2020    DIETARY INTAKE: Usual eating pattern includes 3 meals and 3 snacks per day. Everyday foods include fruits, vegetables, olive oil, starches.  .   Dining Out (times/week): seldom unless traveling with her spouse on his truck 11 AM- [place] spicy chicken and half the fries, says this was not a good choice, did not eat the biscuit yesterday and today ate a smartone potato and broccoli & cheese meal 5 Pm Ziti smart one meal with glass of milk Fruit for snack  Beverages:2% milk 1-2 cups a day, no sugar Gatorade, diet ginger ale, tea with splenda propel, or hot tea with milk and sugar, coffee from [employer]  Usual physical activity: activities of daily living, minimal by her estimate and contemplative about change, refused offer to refer her to PREP as she plans to start next week at a local gym that takes silver sneakers  Estimated daily energy needs for weight loss: 1500-1750 cal/day, est needs to maintain 1950 calories for her goals per body weight planner: In order to maintain your current weight 2,358   Calories/day To reach her goal of 150 lbs  in 272 days:  1,199   Calories/day   Progress Towards Goal(s):  Some progress.   Nutritional Diagnosis:  NB-1.1 Food and nutrition-related knowledge deficit As related to lack of sufficient training on meal planning and diabetes.  As evidenced by her report and her .stated readiness and a desire for weight loss and lack of progress despite medication that helps with weight loss.    Intervention:  Nutrition counseling and education about high potassium, high magnesium foods, importance of activity Action Goal: see patient instructions  Outcome goal: improved knowledge about weight loss Coordination of care:  await a1c, expect is has decreased with weight loss and lifestyle change, Consider  increased dose of Ozempic per patient as needed  Teaching Method Utilized: Visual, Auditory,Hands on Handouts given during visit include:protein and diabetes,  After visit summary Barriers to learning/adherence to lifestyle change:  competing values Demonstrated degree of understanding via:  Teach Back   Monitoring/Evaluation:  Dietary intake, exercise, and body weight in 6 week(s) [name], RD 05/10/2021 11:02 AM. .

## 2021-05-10 NOTE — Patient Instructions (Addendum)
Congrats on your lower blood sugars and lower weight!  High in magnesium- whole grains, nuts, seeds, dark leafy greens you may want to try to have one of these foods daily.  Butch Penny [phone]

## 2021-05-10 NOTE — Progress Notes (Signed)
I reviewed and approve this note and the plan. [name], RD 05/10/2021 12:04 PM.

## 2021-05-10 NOTE — Progress Notes (Signed)
Medical Nutrition Therapy:  Appt start time: 1012 end time:  1050 Total time: 38 Visit # 5  Assessment:  Primary concerns today: desires weight loss. She has lost #7 in the last few weeks since working with RDN(03/29/21).  Her magnesium is still low and the patient says she stopped taking magnesium supplements. She is back on 1 mg Ozempic and metformin. She continues to self monitor by weighing daily at home and is losing 1-2 pounds per week, which is appropriate. The patient states again that she is planning to start working out at a gym. The patient is awaiting to hear back about starting a full-time job. She is happy with her weight loss and says the Ozempic has helped her not snack or eat sweets as much as she normally would. The patient states that she has been taking Zoloft for anxiety. Her husband has recently had surgery, which has contributed to her stress levels. She states that while her husband is away, she does not sleep as well.   Her goal: to weigh 150# by June 2023.  ANTHROPOMETRICS: Estimated body mass index is 31.98 kg/m as calculated from the following:   Height as of 04/20/21: 5\' 5"  (1.651 m).   Weight as of this encounter: 192 lb 3.2 oz (87.2 kg).  WEIGHT HISTORY: Highest: 260# Lowest 208#  Wt Readings from Last 10 Encounters:  05/10/21 192 lb 3.2 oz (87.2 kg)  04/20/21 195 lb 12.8 oz (88.8 kg)  03/29/21 199 lb 9.6 oz (90.5 kg)  03/21/21 200 lb 12.8 oz (91.1 kg)  03/13/21 196 lb 11.2 oz (89.2 kg)  03/11/21 195 lb 12.3 oz (88.8 kg)  03/09/21 194 lb 4.8 oz (88.1 kg)  03/06/21 192 lb (87.1 kg)  02/14/21 206 lb 9.6 oz (93.7 kg)  01/19/21 208 lb 11.2 oz (94.7 kg)   MEDICATIONS: Ozempic 1 mg weekly, metformin  BLOOD SUGAR: great improvement in a1c. Lab Results  Component Value Date   HGBA1C 6.1 (A) 04/20/2021   HGBA1C 7.4 (A) 01/19/2021   HGBA1C 7.2 (A) 10/05/2020   HGBA1C 7.4 (A) 06/14/2020   HGBA1C 7.1 (A) 02/23/2020    DIETARY INTAKE: Usual eating pattern  includes 3 meals per day and  snacks per day and 2-4 snacks per week. Everyday foods include fruits, vegetables, olive oil, starches.  .   Dining Out (times/week): seldom unless traveling with her spouse on his truck Breakfast: 930a.m.- homemade breakfast potatoes, 1 egg; milk, coffee (adds half and half) or tea (adds 2% milk) with splenda.  Lunch: 130p.m.- Smartones (5 cheese ziti; broccoli and cheese potato) Dinner: 530p.m.- Prime rib roast, mashed potatoes, gravy, green beans Snacks: between meals (peanuts and cashews 2/w) (fruit: pineapples or watermelon 3-4/w); no after dinner snacks Beverages: 1 cup 2% milk with meals  Usual physical activity: activities of daily living, minimal by her estimate and contemplative about change. Patient states she would like to begin dancing again (Wii) and walk or ride the bike once she joins the gym.   Estimated daily energy needs for weight loss: 1500-1750 cal/day, est needs to maintain 1950 calories for her goals per body weight planner: In order to maintain your current weight 2,358   Calories/day To reach her goal of 150 lbs in 272 days:  1,199   Calories/day   Progress Towards Goal(s):  Some progress.   Nutritional Diagnosis:  NB-1.1 Food and nutrition-related knowledge deficit As related to lack of sufficient training on meal planning and diabetes.  As evidenced by her report  and her .lack of knowledge regarding saturated fat and the importance of fiber consumption.     Intervention:  Nutrition counseling and education about high magnesium foods, weight maintenance, making healthier choices, importance of physical activity, fiber, and saturated fats.  Action Goal: see patient instructions  Outcome goal: improved knowledge about weight loss and nutrition.  Coordination of care: Continue Ozempic to assist with weight loss and improved a1c levels.   Teaching Method Utilized: Visual, Auditory,Hands on Handouts given during visit include:protein and  diabetes,  After visit summary Barriers to learning/adherence to lifestyle change:  competing values Demonstrated degree of understanding via:  Teach Back   Monitoring/Evaluation:  Dietary intake, exercise, and body weight in 6 week(s) Nichole Raynor 05/10/2021 11:08 AM. .

## 2021-05-10 NOTE — Telephone Encounter (Signed)
Medication Samples have been provided to the patient.  Drug name: OZEMPIC       Strength: 4MG [place] Arms        Qty: 1 BOX/1 PEN  LOT: [id number]  Exp.Date: 08/21/23  Dosing instructions: INJECT 1MG  ONCE WEEKLY   Vista Deck 9:48 AM 05/10/2021

## 2021-05-15 NOTE — Progress Notes (Signed)
Electrolytes stable compared to prior.

## 2021-06-07 ENCOUNTER — Encounter: Payer: Self-pay | Admitting: Dietician

## 2021-06-07 ENCOUNTER — Ambulatory Visit (HOSPITAL_COMMUNITY): Admission: RE | Admit: 2021-06-07 | Payer: Medicare HMO | Source: Ambulatory Visit

## 2021-06-14 ENCOUNTER — Encounter: Payer: Self-pay | Admitting: Internal Medicine

## 2021-06-14 DIAGNOSIS — Z79899 Other long term (current) drug therapy: Secondary | ICD-10-CM | POA: Diagnosis not present

## 2021-06-14 DIAGNOSIS — L987 Excessive and redundant skin and subcutaneous tissue: Secondary | ICD-10-CM | POA: Diagnosis not present

## 2021-06-14 DIAGNOSIS — L910 Hypertrophic scar: Secondary | ICD-10-CM | POA: Diagnosis not present

## 2021-06-14 DIAGNOSIS — L732 Hidradenitis suppurativa: Secondary | ICD-10-CM | POA: Diagnosis not present

## 2021-06-15 ENCOUNTER — Other Ambulatory Visit: Payer: Self-pay | Admitting: Internal Medicine

## 2021-06-15 DIAGNOSIS — F411 Generalized anxiety disorder: Secondary | ICD-10-CM

## 2021-06-15 MED ORDER — SERTRALINE HCL 50 MG PO TABS: 50.0000 mg | ORAL_TABLET | Freq: Every day | ORAL | 2 refills | Status: DC

## 2021-06-21 ENCOUNTER — Ambulatory Visit: Payer: Medicare HMO | Admitting: Dietician

## 2021-06-25 ENCOUNTER — Other Ambulatory Visit: Payer: Self-pay | Admitting: Internal Medicine

## 2021-06-27 ENCOUNTER — Encounter: Payer: Self-pay | Admitting: Internal Medicine

## 2021-06-28 ENCOUNTER — Other Ambulatory Visit (HOSPITAL_COMMUNITY): Payer: Self-pay

## 2021-06-29 ENCOUNTER — Other Ambulatory Visit: Payer: Self-pay | Admitting: Internal Medicine

## 2021-06-29 MED ORDER — FLUTICASONE PROPIONATE 50 MCG/ACT NA SUSP: 1.0000 | Freq: Every day | NASAL | 2 refills | Status: DC

## 2021-07-17 ENCOUNTER — Encounter: Payer: Medicare HMO | Admitting: Internal Medicine

## 2021-07-20 ENCOUNTER — Ambulatory Visit (INDEPENDENT_AMBULATORY_CARE_PROVIDER_SITE_OTHER): Payer: Medicare HMO | Admitting: Internal Medicine

## 2021-07-20 VITALS — BP 160/88 | HR 94 | Temp 98.4°F | Wt 191.1 lb

## 2021-07-20 DIAGNOSIS — J4520 Mild intermittent asthma, uncomplicated: Secondary | ICD-10-CM

## 2021-07-20 DIAGNOSIS — R000 Tachycardia, unspecified: Secondary | ICD-10-CM

## 2021-07-20 DIAGNOSIS — F411 Generalized anxiety disorder: Secondary | ICD-10-CM

## 2021-07-20 DIAGNOSIS — E119 Type 2 diabetes mellitus without complications: Secondary | ICD-10-CM

## 2021-07-20 DIAGNOSIS — R69 Illness, unspecified: Secondary | ICD-10-CM | POA: Diagnosis not present

## 2021-07-20 DIAGNOSIS — I10 Essential (primary) hypertension: Secondary | ICD-10-CM | POA: Diagnosis not present

## 2021-07-20 DIAGNOSIS — E782 Mixed hyperlipidemia: Secondary | ICD-10-CM

## 2021-07-20 DIAGNOSIS — L732 Hidradenitis suppurativa: Secondary | ICD-10-CM

## 2021-07-20 LAB — POCT GLYCOSYLATED HEMOGLOBIN (HGB A1C): Hemoglobin A1C: 6.1 % — AB (ref 4.0–5.6)

## 2021-07-20 LAB — GLUCOSE, CAPILLARY: Glucose-Capillary: 119 mg/dL — ABNORMAL HIGH (ref 70–99)

## 2021-07-20 MED ORDER — BUSPIRONE HCL 5 MG PO TABS: 5.0000 mg | ORAL_TABLET | Freq: Two times a day (BID) | ORAL | 2 refills | Status: DC

## 2021-07-20 MED ORDER — AMLODIPINE BESYLATE 5 MG PO TABS: 5.0000 mg | ORAL_TABLET | Freq: Every day | ORAL | 2 refills | Status: DC

## 2021-07-20 MED ORDER — OZEMPIC (2 MG/DOSE) 8 MG/3ML ~~LOC~~ SOPN: 2.0000 mg | PEN_INJECTOR | SUBCUTANEOUS | 5 refills | Status: DC

## 2021-07-20 MED ORDER — SERTRALINE HCL 100 MG PO TABS: 100.0000 mg | ORAL_TABLET | Freq: Every day | ORAL | 2 refills | Status: DC

## 2021-07-20 NOTE — Assessment & Plan Note (Signed)
Theresa Carter states she is doing well on her current dose of Crestor.   ? ?Assessment/plan: ?- We will need to obtain fasting lipid panel when she has a morning appointment.  Patient is not fasting at this time. ?

## 2021-07-20 NOTE — Patient Instructions (Addendum)
It was nice seeing you today! Thank you for choosing [facility] for your Primary Care.  ?  ?Today we talked about:  ? ?Diabetes:  ?Increase Ozempic to '2mg'$  weekly.  ?I will give the paper work to [place] ?Continue Metformin for now, but you can stop once you start the increased dose of Ozempic ? ?High blood pressure:  ?Continue Losartan ?Start Amlodipine, 1 tablet daily ? ?Anxiety:  ?Increase Zoloft to 100 mg daily ?Start Buspar, 1 tablet twice per day. Start this on the weekend so you know how if affects you first. It can cause sleepiness ? ?Magnesium: We will recheck this today ? ?Do not forget to schedule your pap smear!  ?

## 2021-07-20 NOTE — Assessment & Plan Note (Signed)
Well-controlled at this time with albuterol as needed, montelukast, and Flonase.  No change in management at this time. ?

## 2021-07-20 NOTE — Assessment & Plan Note (Signed)
Theresa Carter states she is no longer taking daily magnesium supplementation as she she did not realize her magnesium levels were still low. ? ?Assessment/plan: ?- Repeat magnesium level today ?

## 2021-07-20 NOTE — Assessment & Plan Note (Signed)
Heart rate improved on examination today.  We will continue to monitor at future visits. ?

## 2021-07-20 NOTE — Assessment & Plan Note (Signed)
BP: (!) 160/88 ? ?Ms. Kovich states that she continues to take losartan daily without any difficulty.  She wonders if her blood pressure is elevated due to increased anxiety.  Her husband was recently diagnosed with leukemia and this has been quite difficult for her and her family. ? ?Assessment/plan: ?Patient previously well managed with 2 agents (losartan-HCTZ), however HCTZ had to be discontinued due to electrolyte abnormalities.  I suspect her elevation at this time is secondary to treatment with only 1 agent.  I discussed this with her.  She would prefer to start her anxiety medications first.  I counseled her to check her blood pressure at home after 2 weeks of starting BuSpar.  If SBP is still above 135, I recommended she go ahead and start amlodipine. ? ?-Continue losartan 100 mg daily ?- Amlodipine 5 mg sent to pharmacy with instructions provided to patient as listed above ?

## 2021-07-20 NOTE — Assessment & Plan Note (Addendum)
Lab Results  ?Component Value Date  ? HGBA1C 6.1 (A) 07/20/2021  ? ?Theresa Carter states that she is taking metformin 1000 mg twice daily and Ozempic 1 mg weekly.  She denies any difficulty with her medication.  She is a little bit disappointed that her weight has not continued to decrease. ? ?Assessment/plan: ?A1c remains within prediabetic range at this point.  Likely in the setting of weight loss.  Given her desire to continue to lose weight, will increase Ozempic to 2 mg weekly.  At that time, I do not suspect she will need continued metformin as well. ? ?- Start Ozempic 2 mg weekly ?- Instructed Theresa Carter that once she is on increased Ozempic dose, she can discontinue metformin ?- Repeat A1c in 3 months ?- Follow-up in 3 months ?

## 2021-07-20 NOTE — Assessment & Plan Note (Signed)
Theresa Carter continues to follow with [facility] dermatology office for management of her HS.  She is on Humira, doxycycline and was recently changed from finasteride to dutasteride.  Her HS has improved significantly on current management, with only 2 active boils left.  She is having these surgically removed in the upcoming weeks. ? ?Assessment/plan: ?- Continue following [facility] dermatology office for further management ?

## 2021-07-20 NOTE — Assessment & Plan Note (Signed)
Theresa Carter states that her anxiety continues to be uncontrolled even after increasing Zoloft to 50 mg daily.  This increase occurred approximately 4 weeks ago.  She describes having episodes several times throughout the day where she feels like she is so anxious she cannot breathe.  Her anxiety has been recently worsened after her husband was diagnosed with leukemia last week.  She states that episodes of not being able to breathe due to anxiety were occurring before her husband's diagnosis though. ? ?Assessment/plan: ?Uncontrolled generalized anxiety disorder with possible overlying panic attack disorder.  We will further titrate Zoloft and start BuSpar. ? ?- Increase Zoloft to 100 mg daily ?- Start BuSpar 5 mg twice daily ?- Follow-up in 3 months or sooner if anxiety remains uncontrolled ?

## 2021-07-20 NOTE — Progress Notes (Signed)
? ?  CC: T2DM; HTN; GAD ? ?HPI: ? ?Theresa Carter is a 51 y.o. with a PMHx as listed below who presents to the clinic for T2DM; HTN; GAD.  ? ?Please see the Encounters tab for problem-based Assessment & Plan regarding status of patient's acute and chronic conditions. ? ?Past Medical History:  ?Diagnosis Date  ? Asthma   ? Chest pain 07/29/2018  ? Diabetes mellitus without complication ([place])   ? Ectopic pregnancy   ? Hypertension   ? ?Review of Systems: Review of Systems  ?Constitutional:  Negative for chills, fever and weight loss.  ?Respiratory:  Negative for cough and shortness of breath.   ?Cardiovascular:  Negative for chest pain and leg swelling.  ?Gastrointestinal:  Positive for constipation. Negative for abdominal pain, diarrhea, nausea and vomiting.  ?Neurological:  Negative for focal weakness and weakness.  ?Endo/Heme/Allergies:  Positive for environmental allergies.  ?Psychiatric/Behavioral:  Negative for depression. The patient is nervous/anxious.   ? ?Physical Exam: ? ?Vitals:  ? 07/20/21 1455 07/20/21 1558  ?BP: (!) 152/93 (!) 160/88  ?Pulse: (!) 101 94  ?Temp: 98.4 ?F (36.9 ?C)   ?TempSrc: Oral   ?Weight: 191 lb 1.6 oz (86.7 kg)   ? ?Physical Exam ?Vitals and nursing note reviewed.  ?Constitutional:   ?   General: She is not in acute distress. ?   Appearance: She is overweight.  ?HENT:  ?   Head: Normocephalic and atraumatic.  ?Cardiovascular:  ?   Rate and Rhythm: Normal rate and regular rhythm.  ?   Heart sounds: No murmur heard. ?Pulmonary:  ?   Effort: Pulmonary effort is normal. No respiratory distress.  ?   Breath sounds: Normal breath sounds. No wheezing, rhonchi or rales.  ?Abdominal:  ?   General: Bowel sounds are normal.  ?Musculoskeletal:  ?   Right lower leg: No edema.  ?   Left lower leg: No edema.  ?Skin: ?   General: Skin is warm and dry.  ?Neurological:  ?   General: No focal deficit present.  ?   Mental Status: She is alert and oriented to person, place, and time. Mental status is  at baseline.  ?Psychiatric:     ?   Attention and Perception: Attention and perception normal.     ?   Mood and Affect: Mood is anxious.     ?   Speech: Speech normal.     ?   Behavior: Behavior normal.     ?   Thought Content: Thought content normal.     ?   Cognition and Memory: Cognition and memory normal.     ?   Judgment: Judgment normal.  ? ? ?Assessment & Plan:  ? ?See Encounters Tab for problem based charting. ? ?Patient discussed with Dr.  Saverio Danker ? ?

## 2021-07-21 ENCOUNTER — Encounter: Payer: Self-pay | Admitting: Internal Medicine

## 2021-07-21 ENCOUNTER — Other Ambulatory Visit: Payer: Self-pay

## 2021-07-21 LAB — MAGNESIUM: Magnesium: 1.1 mg/dL — ABNORMAL LOW (ref 1.6–2.3)

## 2021-07-21 MED ORDER — LOSARTAN POTASSIUM 100 MG PO TABS
100.0000 mg | ORAL_TABLET | Freq: Every day | ORAL | 3 refills | Status: DC
Start: 2021-07-21 — End: 2022-10-18

## 2021-07-21 NOTE — Progress Notes (Signed)
Internal Medicine Clinic Attending  Case discussed with Dr. Basaraba  At the time of the visit.  We reviewed the resident's history and exam and pertinent patient test results.  I agree with the assessment, diagnosis, and plan of care documented in the resident's note.  

## 2021-07-21 NOTE — Progress Notes (Signed)
Magnesium remains low with etiology uncertain. Recommend restarting daily supplementation.

## 2021-08-04 ENCOUNTER — Other Ambulatory Visit: Payer: Self-pay | Admitting: Internal Medicine

## 2021-08-20 ENCOUNTER — Encounter: Payer: Self-pay | Admitting: Internal Medicine

## 2021-08-23 ENCOUNTER — Ambulatory Visit (INDEPENDENT_AMBULATORY_CARE_PROVIDER_SITE_OTHER): Payer: Medicare HMO | Admitting: Internal Medicine

## 2021-08-23 DIAGNOSIS — R69 Illness, unspecified: Secondary | ICD-10-CM | POA: Diagnosis not present

## 2021-08-23 DIAGNOSIS — F411 Generalized anxiety disorder: Secondary | ICD-10-CM | POA: Diagnosis not present

## 2021-08-23 MED ORDER — FLUOXETINE HCL 20 MG PO CAPS: 20.0000 mg | ORAL_CAPSULE | Freq: Every day | ORAL | 2 refills | Status: DC

## 2021-08-23 MED ORDER — CLONAZEPAM 0.5 MG PO TABS: 0.2500 mg | ORAL_TABLET | Freq: Two times a day (BID) | ORAL | 0 refills | Status: DC | PRN

## 2021-08-23 NOTE — Progress Notes (Signed)
?  [facility] Telephone Encounter ?Continuity Care Appointment ? ?HPI:  ?This telephone encounter was created for [name] on 08/23/2021 for the following purpose/cc general anxiety disorder w/ panic attacks.  ? ?Past Medical History:  ?Past Medical History:  ?Diagnosis Date  ? Asthma   ? Chest pain 07/29/2018  ? Diabetes mellitus without complication ([place])   ? Ectopic pregnancy   ? Hypertension   ?  ? ?ROS:  ?+ anxiety, palpitations, SOB  ? ?Assessment / Plan / Recommendations:  ?Please see A&P under problem oriented charting for assessment of the patient's acute and chronic medical conditions.  ?As always, pt is advised that if symptoms worsen or new symptoms arise, they should go to an urgent care facility or to to ER for further evaluation.  ? ?Consent and Medical Decision Making:  ?Patient discussed with Dr. Dareen Piano ?This is a telephone encounter between Theresa Carter and Jose Persia on 08/23/2021 for general anxiety disorder w/ panic attacks. The visit was conducted with the patient located at home and Jose Persia at [facility]. The patient's identity was confirmed using their DOB and current address. The patient has consented to being evaluated through a telephone encounter and understands the associated risks (an examination cannot be done and the patient may need to come in for an appointment) / benefits (allows the patient to remain at home, decreasing exposure to coronavirus). I personally spent 20 minutes on medical discussion.   ? ?

## 2021-08-25 NOTE — Assessment & Plan Note (Addendum)
Theresa Carter states that since her last visit in March 2023, her anxiety has not improved at all despite the increased Zoloft dose to 100 mg daily and the addition of BuSpar.  She states that when she takes BuSpar during the daytime, she does not notice any improvement in her anxiety, palpitations or shortness of breath.  When she takes it at nighttime, she notes that she has a headache in the middle of the night.  She continues to have anxiety on a majority of days that has become debilitating for her.  When she starts to feel anxious, she starts to experience palpitations with shortness of breath.  She states that in the past she could "talk herself down," but has been unable to do so in the last few months.  Most recent stressors include her husband's recent diagnosis of leukemia and his subsequent job change that keeps him away from home 5 out of the 7 days/week.  She is requesting a change in medication.  She notes that in the past, she was on Klonopin with significant improvement in her stress levels.  She only required it for short period after the passing of her family member. ? ?Assessment/plan: ?Overall, Theresa Carter has had no significant response to Zoloft.  Due to this, we will switch to a different SSRI at this time.  Zoloft was the first SSRI that she had tried.  We discussed switching to Prozac, and Theresa Carter is in agreement. ? ?In addition, we will trial a short course of Klonopin for symptomatic relief in the interim.  We discussed extensively the risks of Klonopin use including dizziness and drowsiness.  She was counseled to not use this medication while at work or while driving heavy machinery until she knows how it affects her.  Theresa Carter is in agreement. ? ?- Discontinue Zoloft ?- Discontinue BuSpar ?- Start Prozac 20 mg daily ?- Start Klonopin 0.25 milligrams twice daily as needed ?- Given lack of response to medication management, will request referral to psychiatry ?- 2-week follow-up via  telehealth ?

## 2021-08-28 NOTE — Progress Notes (Signed)
Internal Medicine Clinic Attending  Case discussed with Dr. Basaraba  At the time of the visit.  We reviewed the resident's history and exam and pertinent patient test results.  I agree with the assessment, diagnosis, and plan of care documented in the resident's note.  

## 2021-08-29 ENCOUNTER — Telehealth: Payer: Self-pay | Admitting: Behavioral Health

## 2021-08-29 NOTE — Telephone Encounter (Signed)
Lft msg w/Pt today @ 10:10am re: Physician request for Med Mgmt Referral. Informed Pt to [facility] to [facility] & we will discuss her needs. ? ?Dr. Theodis Shove ?

## 2021-09-06 ENCOUNTER — Ambulatory Visit (INDEPENDENT_AMBULATORY_CARE_PROVIDER_SITE_OTHER): Payer: Medicare HMO | Admitting: Internal Medicine

## 2021-09-06 ENCOUNTER — Telehealth: Payer: Self-pay

## 2021-09-06 ENCOUNTER — Telehealth: Payer: Self-pay | Admitting: Behavioral Health

## 2021-09-06 DIAGNOSIS — R69 Illness, unspecified: Secondary | ICD-10-CM | POA: Diagnosis not present

## 2021-09-06 DIAGNOSIS — F411 Generalized anxiety disorder: Secondary | ICD-10-CM | POA: Diagnosis not present

## 2021-09-06 MED ORDER — FLUOXETINE HCL 40 MG PO CAPS: 40.0000 mg | ORAL_CAPSULE | Freq: Every day | ORAL | 3 refills | Status: DC

## 2021-09-06 NOTE — Telephone Encounter (Signed)
Unable to speak w/Pt today or lv msg re: Referral to Psych Eval Provider & Med Mgmt source. Mailed Pt info about [facility] w/access info included. ? ?Dr. Theodis Shove ?

## 2021-09-06 NOTE — Progress Notes (Signed)
?  [facility] Telephone Encounter ?Continuity Care Appointment ? ?HPI:  ?This telephone encounter was created for [name] on 09/06/2021 for the following purpose/cc anxiety follow up.  ? ?Past Medical History:  ?Past Medical History:  ?Diagnosis Date  ? Asthma   ? Chest pain 07/29/2018  ? Diabetes mellitus without complication ([place])   ? Ectopic pregnancy   ? Hypertension   ?  ? ?ROS:  ?+ anxiety  ? ?Assessment / Plan / Recommendations:  ?Please see A&P under problem oriented charting for assessment of the patient's acute and chronic medical conditions.  ?As always, pt is advised that if symptoms worsen or new symptoms arise, they should go to an urgent care facility or to to ER for further evaluation.  ? ?Consent and Medical Decision Making:  ?Patient discussed with Dr.  Saverio Danker ?This is a telephone encounter between Theresa Carter and Jose Persia on 09/06/2021 for anxiety follow up. The visit was conducted with the patient located at home and Jose Persia at [facility]. The patient's identity was confirmed using their DOB and current address. The patient has consented to being evaluated through a telephone encounter and understands the associated risks (an examination cannot be done and the patient may need to come in for an appointment) / benefits (allows the patient to remain at home, decreasing exposure to coronavirus). I personally spent 10 minutes on medical discussion.   ? ?

## 2021-09-06 NOTE — Assessment & Plan Note (Signed)
Ms. Cressy states she has been doing much better in terms of anxiety since her last follow-up visit approximately 2 weeks ago.  At that time Zoloft and BuSpar were discontinued and we initiated Prozac and Klonopin.  She notes that since starting Prozac, she has noticed that her panic attacks have decreased in frequency.  She has only required Klonopin 4 times in the past 2 weeks.  She is very hopeful and happy with her improvement. ? ?Assessment/plan: ?In the last 2 weeks, patient has had improvement in her overall anxiety levels.  We discussed further titration of Prozac and continuing with Klonopin as needed for episodes of panic attacks. ? ?- Increase Prozac to 40 mg daily ?- Continue Klonopin 0.25 mg twice daily as needed ?- Long-term plan is to discontinue Klonopin once Prozac is at a therapeutic dose.  Patient is in agreement with this plan ?- Follow-up in 4 weeks ?

## 2021-09-06 NOTE — Telephone Encounter (Signed)
Received notification from [place] regarding approval for [place]. Patient assistance approved from 09/06/21 to 03/22/22. ? ?MEDICATION WILL SHIP TO [facility] IN 10-14 BUSINESS DAYS. PT AWARE WE WILL CONTACT HER WHEN MEDICATION IS READY FOR PICKUP. ? ?Phone: [phone] ? ?

## 2021-09-08 NOTE — Progress Notes (Signed)
Internal Medicine Clinic Attending  Case discussed with Dr. Basaraba  At the time of the visit.  We reviewed the resident's history and exam and pertinent patient test results.  I agree with the assessment, diagnosis, and plan of care documented in the resident's note.  

## 2021-09-09 ENCOUNTER — Other Ambulatory Visit: Payer: Self-pay | Admitting: Internal Medicine

## 2021-09-13 DIAGNOSIS — L732 Hidradenitis suppurativa: Secondary | ICD-10-CM | POA: Diagnosis not present

## 2021-09-13 DIAGNOSIS — Z79899 Other long term (current) drug therapy: Secondary | ICD-10-CM | POA: Diagnosis not present

## 2021-09-13 DIAGNOSIS — L910 Hypertrophic scar: Secondary | ICD-10-CM | POA: Diagnosis not present

## 2021-09-13 DIAGNOSIS — L700 Acne vulgaris: Secondary | ICD-10-CM | POA: Diagnosis not present

## 2021-09-20 ENCOUNTER — Telehealth: Payer: Self-pay

## 2021-09-20 NOTE — Telephone Encounter (Signed)
Left voicemail informing patient her novo nordisk medication is ready for pickup here at the office.  Call back [phone]  4 boxes of ozempic are ready and labeled in med room fridge.

## 2021-09-21 NOTE — Telephone Encounter (Signed)
Hi Camille,   I just tried to call Ms. Maniscalco but was unable to reach. LVM to pick up medication from the clinic. Please let me know if you need anything.   Thanks,  Kristeen Miss, [place] Cell: [phone]

## 2021-09-25 NOTE — Telephone Encounter (Signed)
Ozempic picked up by pt.

## 2021-10-08 ENCOUNTER — Encounter: Payer: Self-pay | Admitting: Internal Medicine

## 2021-10-11 ENCOUNTER — Encounter: Payer: Medicare HMO | Admitting: Internal Medicine

## 2021-10-17 ENCOUNTER — Ambulatory Visit (INDEPENDENT_AMBULATORY_CARE_PROVIDER_SITE_OTHER): Payer: Medicare HMO | Admitting: Internal Medicine

## 2021-10-17 DIAGNOSIS — F411 Generalized anxiety disorder: Secondary | ICD-10-CM

## 2021-10-17 DIAGNOSIS — L720 Epidermal cyst: Secondary | ICD-10-CM | POA: Diagnosis not present

## 2021-10-17 DIAGNOSIS — L732 Hidradenitis suppurativa: Secondary | ICD-10-CM | POA: Diagnosis not present

## 2021-10-17 DIAGNOSIS — R69 Illness, unspecified: Secondary | ICD-10-CM | POA: Diagnosis not present

## 2021-10-17 MED ORDER — CLONAZEPAM 0.5 MG PO TABS: 0.2500 mg | ORAL_TABLET | Freq: Two times a day (BID) | ORAL | 0 refills | Status: DC | PRN

## 2021-10-17 MED ORDER — FLUOXETINE HCL 20 MG PO TABS: 60.0000 mg | ORAL_TABLET | Freq: Every day | ORAL | 1 refills | Status: DC

## 2021-10-24 NOTE — Progress Notes (Signed)
Internal Medicine Clinic Attending  Case discussed with Dr. Charleen Kirks  at the time of the visit.  We reviewed the resident's history and pertinent patient test results.  I agree with the assessment, diagnosis, and plan of care documented in the resident's note.

## 2021-11-01 ENCOUNTER — Other Ambulatory Visit: Payer: Self-pay | Admitting: Internal Medicine

## 2021-11-15 DIAGNOSIS — L700 Acne vulgaris: Secondary | ICD-10-CM | POA: Diagnosis not present

## 2021-11-15 DIAGNOSIS — Z79899 Other long term (current) drug therapy: Secondary | ICD-10-CM | POA: Diagnosis not present

## 2021-11-15 DIAGNOSIS — L732 Hidradenitis suppurativa: Secondary | ICD-10-CM | POA: Diagnosis not present

## 2021-12-05 NOTE — Telephone Encounter (Signed)
RTC to patient to give her an appointment for this morning to be seen.  Message left that the Clinics had called to schedule her for an appointment this morning.  Will need to call to let the office know if she can make the appointment.

## 2021-12-06 ENCOUNTER — Ambulatory Visit (INDEPENDENT_AMBULATORY_CARE_PROVIDER_SITE_OTHER): Payer: Medicare HMO | Admitting: Student

## 2021-12-06 ENCOUNTER — Other Ambulatory Visit: Payer: Self-pay

## 2021-12-06 ENCOUNTER — Encounter: Payer: Self-pay | Admitting: Student

## 2021-12-06 VITALS — BP 130/85 | HR 98 | Temp 98.4°F | Ht 65.0 in | Wt 200.0 lb

## 2021-12-06 DIAGNOSIS — J029 Acute pharyngitis, unspecified: Secondary | ICD-10-CM | POA: Insufficient documentation

## 2021-12-06 LAB — GROUP A STREP BY PCR: Group A Strep by PCR: NOT DETECTED

## 2021-12-06 NOTE — Assessment & Plan Note (Addendum)
Patient reports with her for the past 3 days.  Associated with cough that is nonproductive.  Does feel slightly more short of breath and has been using albuterol more frequently.  Her son was found to have strep pharyngitis and was prescribed antibiotics.  She denies fevers or chills.  He is afebrile with clear lungs today.  Does have positive cervical lymphadenopathy but no exudates on exam.  We will check rapid GAS given her exposure.  Also check COVID test given he has been increased community spread recently.    -Rapid strep and COVID negative -Supportive treatment with Tylenol, medicine, dextromethorphan for symptom relief, encouraged rest and hydration

## 2021-12-06 NOTE — Progress Notes (Signed)
Established Patient Office Visit  Subjective   Patient ID: Theresa Carter, female    DOB: 10-23-1970  Age: 51 y.o. MRN: [id number]  Chief Complaint  Patient presents with   Sore Throat     Pt stated she has a sore throat for three days now ... She also stated that today she started having little tightness in her upper chest  near the base of her throat ...    Theresa Carter is a 51 year old person with PMH listed below who presents today for 3 days of sore throat. Please refer to problem based charting for further details and assessment and plan of current problem and chronic medical conditions.   Patient Active Problem List   Diagnosis Date Noted   Sore throat 12/06/2021   Generalized anxiety disorder 04/25/2021   Sinus tachycardia 04/20/2021   Hyperlipidemia 03/22/2021   Memory changes 03/21/2021   Hypomagnesemia 03/14/2021   Thyroid nodule 11/10/2020   Lumbar back pain 02/23/2020   Allergies 08/11/2019   Hidradenitis suppurativa 08/11/2019   Essential hypertension 07/29/2018   Asthma 07/29/2018   Diabetes mellitus type II, non insulin dependent ([place]) 07/29/2018   Chronic pain 07/29/2018   Review of Systems  Constitutional:  Negative for chills and fever.  HENT:  Positive for congestion and sore throat. Negative for ear discharge.   Eyes:  Negative for blurred vision and pain.  Respiratory:  Positive for cough and shortness of breath. Negative for sputum production.   Cardiovascular:  Negative for chest pain and palpitations.  Genitourinary:  Negative for dysuria and frequency.  All other systems reviewed and are negative.    Objective:     BP 130/85 (BP Location: Left Arm, Patient Position: Sitting, Cuff Size: Large)   Pulse 98   Temp 98.4 F (36.9 C) (Oral)   Ht '5\' 5"'$  (1.651 m)   Wt 200 lb (90.7 kg)   SpO2 100%   BMI 33.28 kg/m     Physical Exam Constitutional:      General: She is not in acute distress.    Appearance: She is well-developed.  HENT:      Head: Normocephalic and atraumatic.     Mouth/Throat:     Mouth: Mucous membranes are moist.     Pharynx: Posterior oropharyngeal erythema present. No oropharyngeal exudate.     Tonsils: No tonsillar exudate.  Cardiovascular:     Rate and Rhythm: Normal rate and regular rhythm.     Heart sounds: No murmur heard.    No friction rub. No gallop.  Pulmonary:     Effort: Pulmonary effort is normal.     Breath sounds: No wheezing or rhonchi.     Comments: Coughing intermittently  Abdominal:     General: Bowel sounds are normal.     Palpations: Abdomen is soft.     Tenderness: There is no abdominal tenderness.  Lymphadenopathy:     Cervical: Cervical adenopathy present.  Skin:    General: Skin is warm and dry.  Neurological:     General: No focal deficit present.     Mental Status: She is alert and oriented to person, place, and time.    Results for orders placed or performed in visit on 12/06/21  Group A Strep by PCR   Specimen: Throat; Sterile Swab  Result Value Ref Range   Group A Strep by PCR NOT DETECTED NOT DETECTED  Novel Coronavirus, NAA ([facility])   Specimen: Saline   Saline  Previously tes  Result  Value Ref Range   SARS-CoV-2, NAA Not Detected Not Detected     The 10-year ASCVD risk score (Arnett DK, et al., 2019) is: 5.3%    Assessment & Plan:   Problem List Items Addressed This Visit       Other   Sore throat - Primary    Patient reports with her for the past 3 days.  Associated with cough that is nonproductive.  Does feel slightly more short of breath and has been using albuterol more frequently.  Her son was found to have strep pharyngitis and was prescribed antibiotics.  She denies fevers or chills.  He is afebrile with clear lungs today.  Does have positive cervical lymphadenopathy but no exudates on exam.  We will check rapid GAS given her exposure.  Also check COVID test given he has been increased community spread recently.    -Rapid strep and COVID  negative -Supportive treatment with Tylenol, medicine, dextromethorphan for symptom relief, encouraged rest and hydration       Relevant Orders   Group A Strep by PCR (Completed)   Novel Coronavirus, NAA ([facility]) (Completed)    Return if symptoms worsen or fail to improve.    Iona Beard, MD

## 2021-12-06 NOTE — Patient Instructions (Addendum)
It was a pleasure seeing you in clinic today.   We will check a strep and COVID test today and I will call with results.  You can continue with tylenol, dextromethorphan, and guaifenesin to help with symptoms. Get plenty of rest and stay hydrated.

## 2021-12-07 NOTE — Progress Notes (Signed)
Internal Medicine Clinic Attending ? ?Case discussed with Dr. Liang  At the time of the visit.  We reviewed the resident?s history and exam and pertinent patient test results.  I agree with the assessment, diagnosis, and plan of care documented in the resident?s note. ? ?

## 2021-12-08 LAB — NOVEL CORONAVIRUS, NAA: SARS-CoV-2, NAA: NOT DETECTED

## 2021-12-12 ENCOUNTER — Other Ambulatory Visit: Payer: Self-pay | Admitting: Internal Medicine

## 2021-12-21 ENCOUNTER — Telehealth: Payer: Self-pay

## 2021-12-21 NOTE — Telephone Encounter (Signed)
Left message informing patient her novo nordisk shipment is ready for pickup.  4 boxes of ozempic ('2mg'$  doses) labeled and ready for pickup

## 2021-12-22 ENCOUNTER — Other Ambulatory Visit: Payer: Self-pay | Admitting: Internal Medicine

## 2021-12-26 ENCOUNTER — Other Ambulatory Visit: Payer: Self-pay | Admitting: Internal Medicine

## 2022-01-01 NOTE — Telephone Encounter (Signed)
Pt's spouse here to pick up medication. He was given 4 boxes of Ozempic 2 mg.

## 2022-01-03 ENCOUNTER — Other Ambulatory Visit: Payer: Self-pay | Admitting: Internal Medicine

## 2022-01-22 MED ORDER — CLONAZEPAM 0.5 MG PO TABS: 0.2500 mg | ORAL_TABLET | Freq: Two times a day (BID) | ORAL | 0 refills | Status: DC | PRN

## 2022-01-23 ENCOUNTER — Other Ambulatory Visit: Payer: Self-pay | Admitting: Student

## 2022-01-23 MED ORDER — OMEPRAZOLE 40 MG PO CPDR: 40.0000 mg | DELAYED_RELEASE_CAPSULE | Freq: Every day | ORAL | 0 refills | Status: DC

## 2022-01-31 ENCOUNTER — Encounter: Payer: Self-pay | Admitting: Internal Medicine

## 2022-01-31 ENCOUNTER — Ambulatory Visit (INDEPENDENT_AMBULATORY_CARE_PROVIDER_SITE_OTHER): Payer: Medicare HMO | Admitting: Internal Medicine

## 2022-01-31 DIAGNOSIS — K219 Gastro-esophageal reflux disease without esophagitis: Secondary | ICD-10-CM | POA: Insufficient documentation

## 2022-01-31 MED ORDER — OMEPRAZOLE 40 MG PO CPDR: 40.0000 mg | DELAYED_RELEASE_CAPSULE | Freq: Every day | ORAL | 2 refills | Status: DC

## 2022-01-31 NOTE — Assessment & Plan Note (Signed)
has been on omeprazole 40 for several years, in November was noted to be dehydrated and admitted. Omeprazole was held due to hypomagnesemia and she was started on pepcid, discharged with this, but the Pepcid does not work, she spoke with dr Charleen Kirks some months back and was given the okay to restart omeprazole, but rx was never called in. She was able to take some old pills and called clinic asking for refill but was told she needs appointment. She was able to get a 30 day supply until she could have this telehealth appointment. Wanting refill. Has been doing well with omeprazole. No symptoms. No trouble swallowing, pain, cough, hoarseness, wheeze, or dark/tarry stools.  Has been on omeprazole since at least 2019 Had egd 2017 at outside facility. Would recommend she repeat this. Would also like to test for h pylori but would need to be off PPI 2 weeks. Patient is willing to have EGD, but does not want to be off ppi for duration needed for accurate hypori test. - restart omeprazole - GI referral for EGD - h pylori test pending EGD findings

## 2022-01-31 NOTE — Progress Notes (Signed)
  [facility] Residency Telephone Encounter Continuity Care Appointment  HPI:  This telephone encounter was created for [name] on 01/31/2022 for the following purpose/cc medication.  has been on omeprazole 40 for several years, in November was noted to be dehydrated and admitted. Omeprazole was held due to hypomagnesemia and she was started on pepcid, discharged with this, but the Pepcid does not work, she spoke with dr Charleen Kirks some months back and was given the okay to restart omeprazole, but rx was never called in. She was able to take some old pills and called clinic asking for refill but was told she needs appointment. She was able to get a 30 day supply until she could have this telehealth appointment. Wanting refill. Has been doing well with omeprazole. No symptoms. No trouble swallowing, pain, cough, hoarseness, wheeze, or dark/tarry stools.     Past Medical History:  Past Medical History:  Diagnosis Date   Asthma    Chest pain 07/29/2018   Diabetes mellitus without complication ([place])    Ectopic pregnancy    Hypertension      ROS:  See hpi   Assessment / Plan / Recommendations:  Please see A&P under problem oriented charting for assessment of the patient's acute and chronic medical conditions.  Has been on omeprazole since at least 2019 Had egd 2017 at outside facility. Would recommend she repeat this. Would also like to test for h pylori but would need to be off PPI 2 weeks. Patient is willing to have EGD, but does not want to be off ppi for duration needed for accurate hypori test. - restart omeprazole - GI referral for EGD - h pylori test pending EGD findings As always, pt is advised that if symptoms worsen or new symptoms arise, they should go to an urgent care facility or to to ER for further evaluation.   Consent and Medical Decision Making:  Patient discussed with Dr.  Saverio Danker This is a telephone encounter between [name] and  Delene Ruffini on 01/31/2022 for medication. The visit was conducted with the patient located at home and Delene Ruffini at [facility]. The patient's identity was confirmed using their DOB and current address. The patient has consented to being evaluated through a telephone encounter and understands the associated risks (an examination cannot be done and the patient may need to come in for an appointment) / benefits (allows the patient to remain at home, decreasing exposure to coronavirus). I personally spent 15 minutes on medical discussion.

## 2022-02-05 NOTE — Addendum Note (Signed)
Addended by: Charise Killian on: 02/05/2022 03:54 PM   Modules accepted: Level of Service

## 2022-02-05 NOTE — Progress Notes (Signed)
Internal Medicine Clinic Attending  Case discussed with Dr. Elliot Gurney  At the time of the visit.  We reviewed the resident's history and exam and pertinent patient test results.  I agree with the assessment, diagnosis, and plan of care documented in the resident's note. Referral to GI given longstanding GERD, unable to titrate off PPI therapy and likely need for further evaluation of symptoms with EGD. Recommend Theresa Carter return to clinic for labs to check magnesium and f/u BP, DM, etc.

## 2022-02-09 DIAGNOSIS — Z8041 Family history of malignant neoplasm of ovary: Secondary | ICD-10-CM | POA: Diagnosis not present

## 2022-02-09 DIAGNOSIS — R69 Illness, unspecified: Secondary | ICD-10-CM | POA: Diagnosis not present

## 2022-02-09 DIAGNOSIS — Z6834 Body mass index (BMI) 34.0-34.9, adult: Secondary | ICD-10-CM | POA: Diagnosis not present

## 2022-02-09 DIAGNOSIS — Z124 Encounter for screening for malignant neoplasm of cervix: Secondary | ICD-10-CM | POA: Diagnosis not present

## 2022-02-09 DIAGNOSIS — Z0000 Encounter for general adult medical examination without abnormal findings: Secondary | ICD-10-CM | POA: Diagnosis not present

## 2022-02-09 DIAGNOSIS — Z01419 Encounter for gynecological examination (general) (routine) without abnormal findings: Secondary | ICD-10-CM | POA: Diagnosis not present

## 2022-02-14 DIAGNOSIS — Z79899 Other long term (current) drug therapy: Secondary | ICD-10-CM | POA: Diagnosis not present

## 2022-02-14 DIAGNOSIS — L732 Hidradenitis suppurativa: Secondary | ICD-10-CM | POA: Diagnosis not present

## 2022-02-14 DIAGNOSIS — L910 Hypertrophic scar: Secondary | ICD-10-CM | POA: Diagnosis not present

## 2022-02-14 DIAGNOSIS — L700 Acne vulgaris: Secondary | ICD-10-CM | POA: Diagnosis not present

## 2022-02-15 ENCOUNTER — Other Ambulatory Visit: Payer: Self-pay | Admitting: Obstetrics and Gynecology

## 2022-02-15 DIAGNOSIS — Z1231 Encounter for screening mammogram for malignant neoplasm of breast: Secondary | ICD-10-CM

## 2022-02-19 ENCOUNTER — Other Ambulatory Visit: Payer: Self-pay | Admitting: Student

## 2022-03-12 ENCOUNTER — Other Ambulatory Visit: Payer: Self-pay

## 2022-03-20 ENCOUNTER — Other Ambulatory Visit: Payer: Self-pay

## 2022-03-20 DIAGNOSIS — E119 Type 2 diabetes mellitus without complications: Secondary | ICD-10-CM

## 2022-03-20 MED ORDER — FLUTICASONE PROPIONATE 50 MCG/ACT NA SUSP: NASAL | 1 refills | Status: DC

## 2022-03-20 MED ORDER — BD PEN NEEDLE NANO 2ND GEN 32G X 4 MM MISC: 3 refills | Status: AC

## 2022-03-20 MED ORDER — ROSUVASTATIN CALCIUM 5 MG PO TABS: 5.0000 mg | ORAL_TABLET | Freq: Every day | ORAL | 1 refills | Status: DC

## 2022-03-20 MED ORDER — FLUOXETINE HCL 60 MG PO TABS: 60.0000 mg | ORAL_TABLET | Freq: Every day | ORAL | 2 refills | Status: DC

## 2022-03-21 MED ORDER — CYCLOBENZAPRINE HCL 10 MG PO TABS: 10.0000 mg | ORAL_TABLET | Freq: Every day | ORAL | 2 refills | Status: DC

## 2022-03-29 ENCOUNTER — Telehealth: Payer: Self-pay

## 2022-03-29 NOTE — Telephone Encounter (Signed)
Informed pt her novo nordisk shipment is ready for pickup.   4 boxes of ozempic '2mg'$  dose pens are labeled and ready in med room fridge.

## 2022-04-02 NOTE — Telephone Encounter (Signed)
Pt's husband picked up 4 boxes of Ozempic pens.

## 2022-04-11 ENCOUNTER — Ambulatory Visit
Admission: RE | Admit: 2022-04-11 | Discharge: 2022-04-11 | Disposition: A | Discharge status: Home / Self Care | Payer: Medicare HMO | Source: Ambulatory Visit | Attending: Obstetrics and Gynecology | Admitting: Obstetrics and Gynecology

## 2022-04-11 DIAGNOSIS — Z1231 Encounter for screening mammogram for malignant neoplasm of breast: Secondary | ICD-10-CM

## 2022-04-30 DIAGNOSIS — M791 Myalgia, unspecified site: Secondary | ICD-10-CM | POA: Diagnosis not present

## 2022-04-30 DIAGNOSIS — J069 Acute upper respiratory infection, unspecified: Secondary | ICD-10-CM | POA: Diagnosis not present

## 2022-04-30 DIAGNOSIS — R5383 Other fatigue: Secondary | ICD-10-CM | POA: Diagnosis not present

## 2022-05-02 DIAGNOSIS — Z79899 Other long term (current) drug therapy: Secondary | ICD-10-CM | POA: Diagnosis not present

## 2022-05-02 DIAGNOSIS — L910 Hypertrophic scar: Secondary | ICD-10-CM | POA: Diagnosis not present

## 2022-05-02 DIAGNOSIS — L700 Acne vulgaris: Secondary | ICD-10-CM | POA: Diagnosis not present

## 2022-05-02 DIAGNOSIS — L732 Hidradenitis suppurativa: Secondary | ICD-10-CM | POA: Diagnosis not present

## 2022-05-12 ENCOUNTER — Other Ambulatory Visit: Payer: Self-pay | Admitting: Internal Medicine

## 2022-05-25 ENCOUNTER — Other Ambulatory Visit: Payer: Self-pay | Admitting: *Deleted

## 2022-05-25 ENCOUNTER — Ambulatory Visit: Payer: Medicare HMO | Admitting: Medical

## 2022-05-28 MED ORDER — MONTELUKAST SODIUM 10 MG PO TABS: 10.0000 mg | ORAL_TABLET | Freq: Every day | ORAL | 3 refills | Status: DC

## 2022-05-29 ENCOUNTER — Other Ambulatory Visit: Payer: Self-pay | Admitting: Allergy

## 2022-05-29 ENCOUNTER — Other Ambulatory Visit (HOSPITAL_COMMUNITY): Payer: Self-pay

## 2022-05-29 MED ORDER — PAXLOVID (300/100) 20 X 150 MG & 10 X 100MG PO TBPK: 3.0000 | ORAL_TABLET | Freq: Two times a day (BID) | ORAL | 0 refills | Status: DC

## 2022-05-29 MED ORDER — PAXLOVID (300/100) 20 X 150 MG & 10 X 100MG PO TBPK
3.0000 | ORAL_TABLET | Freq: Two times a day (BID) | ORAL | 0 refills | Status: DC
  Filled 2022-05-29: qty 30, 5d supply, fill #0

## 2022-05-29 NOTE — Telephone Encounter (Signed)
Patient picked up prescription.

## 2022-05-29 NOTE — Progress Notes (Signed)
Advised patient to hold statin while taking paxlovid and also to monitor blood pressure. Discussed side effect of metallic taste that is common.

## 2022-05-29 NOTE — Addendum Note (Signed)
Addended by: Garnet Sierras on: 05/29/2022 11:44 AM   Modules accepted: Orders

## 2022-06-05 ENCOUNTER — Ambulatory Visit: Payer: Medicare HMO | Admitting: Medical

## 2022-06-13 ENCOUNTER — Ambulatory Visit (INDEPENDENT_AMBULATORY_CARE_PROVIDER_SITE_OTHER): Payer: Medicare HMO | Admitting: Medical

## 2022-06-13 ENCOUNTER — Encounter: Payer: Self-pay | Admitting: Medical

## 2022-06-13 VITALS — BP 146/80 | HR 92 | Resp 18 | Ht 65.0 in | Wt 217.0 lb

## 2022-06-13 DIAGNOSIS — F411 Generalized anxiety disorder: Secondary | ICD-10-CM | POA: Diagnosis not present

## 2022-06-13 DIAGNOSIS — J4520 Mild intermittent asthma, uncomplicated: Secondary | ICD-10-CM | POA: Diagnosis not present

## 2022-06-13 DIAGNOSIS — E119 Type 2 diabetes mellitus without complications: Secondary | ICD-10-CM

## 2022-06-13 DIAGNOSIS — L732 Hidradenitis suppurativa: Secondary | ICD-10-CM

## 2022-06-13 DIAGNOSIS — R69 Illness, unspecified: Secondary | ICD-10-CM | POA: Diagnosis not present

## 2022-06-13 DIAGNOSIS — T7840XA Allergy, unspecified, initial encounter: Secondary | ICD-10-CM

## 2022-06-13 DIAGNOSIS — K219 Gastro-esophageal reflux disease without esophagitis: Secondary | ICD-10-CM | POA: Diagnosis not present

## 2022-06-13 DIAGNOSIS — I10 Essential (primary) hypertension: Secondary | ICD-10-CM

## 2022-06-13 DIAGNOSIS — F419 Anxiety disorder, unspecified: Secondary | ICD-10-CM | POA: Diagnosis not present

## 2022-06-13 LAB — HEMOGLOBIN A1C: Hgb A1c MFr Bld: 6.7 % — ABNORMAL HIGH (ref 4.6–6.5)

## 2022-06-13 MED ORDER — BUSPIRONE HCL 7.5 MG PO TABS: 7.5000 mg | ORAL_TABLET | Freq: Three times a day (TID) | ORAL | 0 refills | Status: DC

## 2022-06-13 MED ORDER — BUDESONIDE-FORMOTEROL FUMARATE 160-4.5 MCG/ACT IN AERO: 2.0000 | INHALATION_SPRAY | Freq: Two times a day (BID) | RESPIRATORY_TRACT | 0 refills | Status: DC

## 2022-06-13 NOTE — Progress Notes (Signed)
Subjective:    Patient ID: Theresa Carter, female    DOB: October 11, 1970, 52 y.o.   MRN: [id number]  HPI Pt in for first time.  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   Diabetes- on  ozempic 2 mg weekly. Last a1-c 10 month.  Htn- losartan 100 mg daily. In past was given amlodipine 5 mg daily but pt never took.  Hyperlipidemia- Crestor 5 mg daily.   Seasonal allergies- on montelukast.  Anxiety- Gad 7 score 4. Pt was on buspar in the past but did not help much with anxiety. She was on ssri to control anxiety. But she associate wt gain with prozac.  Mood- depression- phq-9 5. 3 traumatic events. Husband on road truck driver. Brother in Sports coach and cousin got murdered. Pt was on sertraline and is on prozac. She is on 60 mg daily.  Gerd- on omeprazole.   Post covid had persisting cough and mild sob. Had covid on feb 6. Pt works with MD who gave her adviar and med combo sterid and albuterol inhaler.     Review of Systems  Constitutional:  Negative for chills, fatigue and fever.  HENT:  Negative for congestion and ear discharge.   Respiratory:  Negative for cough, chest tightness, shortness of breath and wheezing.   Cardiovascular:  Negative for chest pain.  Gastrointestinal:  Negative for abdominal pain.  Genitourinary:  Negative for dyspareunia, flank pain and genital sores.  Skin:  Negative for rash.  Neurological:  Negative for dizziness, light-headedness, numbness and headaches.  Hematological:  Negative for adenopathy. Does not bruise/bleed easily.  Psychiatric/Behavioral:  Positive for dysphoric mood and sleep disturbance. Negative for behavioral problems, confusion and suicidal ideas. The patient is nervous/anxious.     Past Medical History:  Diagnosis Date   Asthma    Chest pain 07/29/2018   Diabetes mellitus without complication ([place])    Ectopic pregnancy    Hypertension      Social History   Socioeconomic History   Marital status: Married    Spouse name: Not on  file   Number of children: Not on file   Years of education: Not on file   Highest education level: Not on file  Occupational History   Not on file  Tobacco Use   Smoking status: Former   Smokeless tobacco: Never   Tobacco comments:    Quit 2001.  Vaping Use   Vaping Use: Never used  Substance and Sexual Activity   Alcohol use: Yes    Comment: RARE   Drug use: Never   Sexual activity: Not on file  Other Topics Concern   Not on file  Social History Narrative   Not on file   Social Determinants of Health   Financial Resource Strain: Not on file  Food Insecurity: Not on file  Transportation Needs: Not on file  Physical Activity: Not on file  Stress: Not on file  Social Connections: Not on file  Intimate Partner Violence: Not on file    Past Surgical History:  Procedure Laterality Date   CHOLECYSTECTOMY     DILATION AND CURETTAGE OF UTERUS     ENDOMETRIAL ABLATION     HIP ARTHROSCOPY     x2   SALPINGECTOMY     TRIGGER FINGER RELEASE     TUBAL LIGATION     WRIST ARTHROSCOPY      Family History  Problem Relation Age of Onset   Venous thrombosis Mother    Breast cancer Mother  Lung cancer Father    Aneurysm Father        brain   Heart attack Brother    Stroke Brother    Venous thrombosis Son        Has antiphospholipid syndrome, and bechets disease.    No Known Allergies  Current Outpatient Medications on File Prior to Visit  Medication Sig Dispense Refill   mupirocin ointment (BACTROBAN) 2 % Apply topically.     acetaminophen (TYLENOL) 500 MG tablet Take 1,000 mg by mouth every 6 (six) hours as needed for mild pain.     Adalimumab (HUMIRA PEN) 40 MG/0.4ML PNKT Inject 40 mg into the skin once a week. Tuesday     albuterol (VENTOLIN HFA) 108 (90 Base) MCG/ACT inhaler Inhale 2 puffs into the lungs every 6 (six) hours as needed for wheezing or shortness of breath. 8 g 3   amLODipine (NORVASC) 5 MG tablet Take 1 tablet (5 mg total) by mouth daily. 30 tablet  2   Blood Glucose Monitoring Suppl (ONETOUCH VERIO) w/Device KIT 1 kit by Does not apply route daily. Check blood glucose once daily as needed 1 kit 0   clonazePAM (KLONOPIN) 0.5 MG tablet Take 0.5 tablets (0.25 mg total) by mouth 2 (two) times daily as needed for anxiety. 15 tablet 0   cyclobenzaprine (FLEXERIL) 10 MG tablet Take 1 tablet (10 mg total) by mouth at bedtime. 30 tablet 2   dutasteride (AVODART) 0.5 MG capsule Take one tablet daily on week days (Monday-Friday). Do not take if pregnant or trying to get pregnant.     FLUoxetine HCl 60 MG TABS Take 60 mg by mouth daily. 30 tablet 2   fluticasone (FLONASE) 50 MCG/ACT nasal spray USE 1 SPRAY INTO EACH NOSTRILS DAILY 16 mL 1   glucose blood (ONETOUCH VERIO) test strip Check blood glucose once daily as needed 100 each 12   Insulin Pen Needle (BD PEN NEEDLE NANO 2ND GEN) 32G X 4 MM MISC USE TO INJECT DIABETIC MEDICATION INTO THE SKIN 1 TIME DAILY 100 each 3   losartan (COZAAR) 100 MG tablet Take 1 tablet (100 mg total) by mouth daily. 100 tablet 3   metFORMIN (GLUCOPHAGE) 1000 MG tablet Take 1 tablet (1,000 mg total) by mouth 2 (two) times daily. 180 tablet 3   montelukast (SINGULAIR) 10 MG tablet Take 1 tablet (10 mg total) by mouth at bedtime. 90 tablet 3   omeprazole (PRILOSEC) 40 MG capsule TAKE 1 CAPSULE BY MOUTH DAILY 90 capsule 0   OneTouch Delica Lancets 99991111 MISC Check blood sugar once daily as needed 100 each 12   rosuvastatin (CRESTOR) 5 MG tablet Take 1 tablet (5 mg total) by mouth daily. 90 tablet 1   Semaglutide, 2 MG/DOSE, (OZEMPIC, 2 MG/DOSE,) 8 MG/3ML SOPN Inject 2 mg into the skin once a week. 3 mL 5   No current facility-administered medications on file prior to visit.    BP (!) 146/80   Pulse 92   Resp 18   Ht 5' 5"$  (1.651 m)   Wt 217 lb (98.4 kg)   SpO2 100%   BMI 36.11 kg/m        Objective:   Physical Exam  General Mental Status- Alert. General Appearance- Not in acute distress.   Skin General: Color-  Normal Color. Moisture- Normal Moisture.  Neck Carotid Arteries- Normal color. Moisture- Normal Moisture. No carotid bruits. No JVD.  Chest and Lung Exam Auscultation: Breath Sounds:-Normal.  Cardiovascular Auscultation:Rythm- Regular. Murmurs & Other  Heart Sounds:Auscultation of the heart reveals- No Murmurs.  Abdomen Inspection:-Inspeection Normal. Palpation/Percussion:Note:No mass. Palpation and Percussion of the abdomen reveal- Non Tender, Non Distended + BS, no rebound or guarding.   Neurologic Cranial Nerve exam:- CN III-XII intact(No nystagmus), symmetric smile. Strength:- 5/5 equal and symmetric strength both upper and lower extremities.       Assessment & Plan:   Patient Instructions  1. Generalized anxiety disorder I think it is worthwhile to get back on buspar at 7.5 mg dose three times daily. You may have been on too low of dose in past. Will see how you buspar. Continue prozac 60 mg daily for now.   2. Mild intermittent asthma without complication Continue with albuterol every 6 hours as needed. But with resent cough post covid will rx symbicort 2 inhalation twice daily for 2 weeks. Might consider taper medrol if sugar average tight controlled.  3. Allergy, initial encounter Continue montelukast.  4. Diabetes mellitus type II, non insulin dependent (HCC) Continue ozempic and check A1c with cmp.  5. Essential hypertension Very high bp iniitially then 146/80 when I checked. Continue losartan 100 mg daily and add on amlodipine 5 mg daily  6. Chronic GERD Omeprazole controlling symptoms.  7.Hidradenitis suppurativa. Continue humira and other meds dermatologist rx'd.  Follow up in 1 month or sooner if needed.     Mackie Pai, PA-C

## 2022-06-13 NOTE — Patient Instructions (Addendum)
1. Generalized anxiety disorder I think it is worthwhile to get back on buspar at 7.5 mg dose three times daily. You may have been on too low of dose in past. Will see how you buspar. Continue prozac 60 mg daily for now.   2. Mild intermittent asthma without complication Continue with albuterol every 6 hours as needed. But with resent cough post covid will rx symbicort 2 inhalation twice daily for 2 weeks. Might consider taper medrol if sugar average tight controlled.  3. Allergy, initial encounter Continue montelukast.  4. Diabetes mellitus type II, non insulin dependent (HCC) Continue ozempic and check A1c with cmp.  5. Essential hypertension Very high bp iniitially then 146/80 when I checked. Continue losartan 100 mg daily and add on amlodipine 5 mg daily  6. Chronic GERD Omeprazole controlling symptoms.  7.Hidradenitis suppurativa. Continue humira and other meds dermatologist rx'd.  Follow up in 1 month or sooner if needed.

## 2022-06-14 ENCOUNTER — Encounter: Payer: Self-pay | Admitting: Medical

## 2022-06-14 LAB — COMPREHENSIVE METABOLIC PANEL
ALT: 16 U/L (ref 0–35)
AST: 13 U/L (ref 0–37)
Albumin: 4.1 g/dL (ref 3.5–5.2)
Alkaline Phosphatase: 63 U/L (ref 39–117)
BUN: 16 mg/dL (ref 6–23)
CO2: 27 mEq/L (ref 19–32)
Calcium: 9.3 mg/dL (ref 8.4–10.5)
Chloride: 101 mEq/L (ref 96–112)
Creatinine, Ser: 0.68 mg/dL (ref 0.40–1.20)
GFR: 101.56 mL/min (ref >60.00)
Glucose, Bld: 85 mg/dL (ref 70–99)
Potassium: 4 mEq/L (ref 3.5–5.1)
Sodium: 138 mEq/L (ref 135–145)
Total Bilirubin: 0.5 mg/dL (ref 0.2–1.2)
Total Protein: 7 g/dL (ref 6.0–8.3)

## 2022-06-17 ENCOUNTER — Other Ambulatory Visit: Payer: Self-pay

## 2022-06-23 NOTE — Telephone Encounter (Signed)
Rx refill sent to pharmacy. 

## 2022-07-04 ENCOUNTER — Other Ambulatory Visit: Payer: Self-pay

## 2022-07-09 ENCOUNTER — Other Ambulatory Visit: Payer: Self-pay | Admitting: Medical

## 2022-07-13 ENCOUNTER — Encounter: Payer: Self-pay | Admitting: Medical

## 2022-07-13 ENCOUNTER — Telehealth (INDEPENDENT_AMBULATORY_CARE_PROVIDER_SITE_OTHER): Payer: Medicare HMO | Admitting: Medical

## 2022-07-13 VITALS — BP 130/90

## 2022-07-13 DIAGNOSIS — T7840XA Allergy, unspecified, initial encounter: Secondary | ICD-10-CM | POA: Diagnosis not present

## 2022-07-13 DIAGNOSIS — K219 Gastro-esophageal reflux disease without esophagitis: Secondary | ICD-10-CM

## 2022-07-13 DIAGNOSIS — J4520 Mild intermittent asthma, uncomplicated: Secondary | ICD-10-CM

## 2022-07-13 DIAGNOSIS — F411 Generalized anxiety disorder: Secondary | ICD-10-CM | POA: Diagnosis not present

## 2022-07-13 DIAGNOSIS — I10 Essential (primary) hypertension: Secondary | ICD-10-CM | POA: Diagnosis not present

## 2022-07-13 DIAGNOSIS — R69 Illness, unspecified: Secondary | ICD-10-CM | POA: Diagnosis not present

## 2022-07-13 MED ORDER — LEVOCETIRIZINE DIHYDROCHLORIDE 5 MG PO TABS: 5.0000 mg | ORAL_TABLET | Freq: Every evening | ORAL | 3 refills | Status: DC

## 2022-07-13 MED ORDER — BUSPIRONE HCL 15 MG PO TABS: 15.0000 mg | ORAL_TABLET | Freq: Two times a day (BID) | ORAL | 0 refills | Status: DC

## 2022-07-13 NOTE — Telephone Encounter (Signed)
Updated vitals in visit

## 2022-07-13 NOTE — Patient Instructions (Signed)
Generalized anxiety disorder  Mild intermittent asthma without complication  Allergy, initial encounter  Essential hypertension  Chronic GERD  Other orders -     busPIRone HCl; Take 1 tablet (15 mg total) by mouth 2 (two) times daily.  Dispense: 60 tablet; Refill: 0 -     Levocetirizine Dihydrochloride; Take 1 tablet (5 mg total) by mouth every evening.  Dispense: 90 tablet; Refill: 3    Anxiety about the same but on discussion you did decrease the Prozac from 60 mg daily to 30 mg daily.  Also you are taking 7.5 mg BuSpar twice daily as opposed to 3 times daily.  In light of this recommend increasing BuSpar to 15 mg twice daily and continue the Prozac 30 mg daily.  You do desire to get off the Prozac completely due to attributed weight gain.  I would hold off on doing that presently.  Have concern that your anxiety might increase.  Asthma and allergy.  Relatively well-controlled.  Continue current inhaler and oral med regimen.  Adding Xyzal antihistamine.  GERD well-controlled.  Continue current PPI.  Hypertension-continue losartan and recommend starting the amlodipine as we discussed on last visit.  Check blood pressure 1 week after starting amlodipine and send me a MyChart update on BP level.  Follow-up in 1 month or sooner if needed

## 2022-07-13 NOTE — Progress Notes (Signed)
   Subjective:    Patient ID: Theresa Carter, female    DOB: [birth year], 52 y.o.   MRN: [id number]  HPI  Virtual Visit via Video Note  I connected with Jeneen Colao on 07/13/22 at  1:00 PM EDT by a video enabled telemedicine application and verified that I am speaking with the correct person using two identifiers.  Location: Patient: office/her work in [place]. Provider: office   I discussed the limitations of evaluation and management by telemedicine and the availability of in person appointments. The patient expressed understanding and agreed to proceed.  History of Present Illness: Anxiety-  is about the same. She is on buspar 7.5 mg twice daily. She is not taking mid day dose. She cut back on prozac. Now only taking 30 mg daily. Pt decided to cut back as she attributed weight gain due to the prozac. She states has gained weight with.  Pt allergies and asthma relatively well controlled. On singuala, albuterol and symbicort. She we discussed and will add on xyzal.  Htn- pt on losartan 100 mg daily. She has not started amlodipine yet. She did not check bp since last visit.   Observations/Objective: General-no acute distress, pleasant, oriented. Lungs- on inspection lungs appear unlabored. Neck- no tracheal deviation or jvd on inspection. Neuro- gross motor function appears intact.   Assessment and Plan: Patient Instructions  Generalized anxiety disorder  Mild intermittent asthma without complication  Allergy, initial encounter  Essential hypertension  Chronic GERD  Other orders -     busPIRone HCl; Take 1 tablet (15 mg total) by mouth 2 (two) times daily.  Dispense: 60 tablet; Refill: 0 -     Levocetirizine Dihydrochloride; Take 1 tablet (5 mg total) by mouth every evening.  Dispense: 90 tablet; Refill: 3    Anxiety about the same but on discussion you did decrease the Prozac from 60 mg daily to 30 mg daily.  Also you are taking 7.5 mg BuSpar twice daily as opposed  to 3 times daily.  In light of this recommend increasing BuSpar to 15 mg twice daily and continue the Prozac 30 mg daily.  You do desire to get off the Prozac completely due to attributed weight gain.  I would hold off on doing that presently.  Have concern that your anxiety might increase.  Asthma and allergy.  Relatively well-controlled.  Continue current inhaler and oral med regimen.  Adding Xyzal antihistamine.  GERD well-controlled.  Continue current PPI.  Hypertension-continue losartan and recommend starting the amlodipine as we discussed on last visit.  Check blood pressure 1 week after starting amlodipine and send me a MyChart update on BP level.  Follow-up in 1 month or sooner if needed   Mackie Pai, PA-C   Follow Up Instructions:    I discussed the assessment and treatment plan with the patient. The patient was provided an opportunity to ask questions and all were answered. The patient agreed with the plan and demonstrated an understanding of the instructions.   The patient was advised to call back or seek an in-person evaluation if the symptoms worsen or if the condition fails to improve as anticipated.     Mackie Pai, PA-C   Review of Systems     Objective:   Physical Exam        Assessment & Plan:

## 2022-07-28 DIAGNOSIS — E119 Type 2 diabetes mellitus without complications: Secondary | ICD-10-CM | POA: Diagnosis not present

## 2022-07-28 LAB — HM DIABETES EYE EXAM

## 2022-08-02 ENCOUNTER — Encounter: Payer: Self-pay | Admitting: Medical

## 2022-08-05 ENCOUNTER — Other Ambulatory Visit: Payer: Self-pay | Admitting: Medical

## 2022-08-06 ENCOUNTER — Other Ambulatory Visit: Payer: Self-pay | Admitting: Medical

## 2022-08-10 ENCOUNTER — Other Ambulatory Visit: Payer: Self-pay | Admitting: Medical

## 2022-08-14 ENCOUNTER — Ambulatory Visit (INDEPENDENT_AMBULATORY_CARE_PROVIDER_SITE_OTHER): Payer: Medicare HMO | Admitting: Medical

## 2022-08-14 VITALS — BP 137/78 | HR 80 | Resp 18 | Ht 65.0 in | Wt 224.0 lb

## 2022-08-14 DIAGNOSIS — M542 Cervicalgia: Secondary | ICD-10-CM | POA: Diagnosis not present

## 2022-08-14 DIAGNOSIS — I10 Essential (primary) hypertension: Secondary | ICD-10-CM | POA: Diagnosis not present

## 2022-08-14 DIAGNOSIS — E119 Type 2 diabetes mellitus without complications: Secondary | ICD-10-CM | POA: Diagnosis not present

## 2022-08-14 DIAGNOSIS — R635 Abnormal weight gain: Secondary | ICD-10-CM

## 2022-08-14 DIAGNOSIS — F411 Generalized anxiety disorder: Secondary | ICD-10-CM | POA: Diagnosis not present

## 2022-08-14 DIAGNOSIS — R69 Illness, unspecified: Secondary | ICD-10-CM | POA: Diagnosis not present

## 2022-08-14 MED ORDER — AMLODIPINE BESYLATE 5 MG PO TABS: 5.0000 mg | ORAL_TABLET | Freq: Every day | ORAL | 3 refills | Status: DC

## 2022-08-14 MED ORDER — CYCLOBENZAPRINE HCL 5 MG PO TABS: ORAL_TABLET | ORAL | 1 refills | Status: DC

## 2022-08-14 MED ORDER — FLUOXETINE HCL 20 MG PO CAPS: 20.0000 mg | ORAL_CAPSULE | Freq: Every day | ORAL | 0 refills | Status: DC

## 2022-08-14 NOTE — Patient Instructions (Addendum)
1. Generalized anxiety disorder Increase buspar to 15 mg three times daily as you taper of prozac.   2. Diabetes mellitus type II, non insulin dependent Continue ozempic 2 mg weekly IM.  3. Essential hypertension Refilled amlodpine. Continue losartan.  4. Weight gain You associated with weight gain. Will taper you off prozac.  5. Neck pain Flexeril 5 mg to use if needed at night. On review has helped with insomnia as well. I think better to use lower dose.  Future cmp and A1c placed. To get done end of May.  Follow up date to be determined after lab review. Sooner if needed.    08-21-2022.   This is in Epic under care everywhere , pt is scheduled for MRI 09/19/2022 .Marland Kitchen Do we really need to schedule her because it looks like opthalmology has this handled?   ASSESSMENT/PLAN: 1. Type 2 diabetes mellitus without complication, without long-term current use of insulin (CMS/HCC)   2. Long term current use of oral hypoglycemic drug   3. Papilledema of both eyes OCT, Optic Nerve - OU - Both Eyes  Ambulatory referral to Neurology  MRI Orbit Face And/Or Neck W And WO Contrast  MRI Brain W And WO Contrast  MRV Head WO Contrast  CANCELED: OCT, Macula - OU - Both Eyes   4. Pseudotumor cerebri OCT, Optic Nerve - OU - Both Eyes  Ambulatory referral to Neurology  MRI Orbit Face And/Or Neck W And WO Contrast  MRI Brain W And WO Contrast   5. Vitreous floater, bilateral   6. Myopia of both eyes     08-21-2022. Interim reequest since last visit asking me to get pt in with neurologist and get mri moved up rather than waiting a month. Asking pt to come in office as I don't know if studies have been prior auth and I can't see that referral to neurology has been placed Type II Diabetic: No evidence of retinopathy, encourage control of A1c.  Papilledema both eyes: detailed discussion regarding bilateral disc edema, plan to order MRI/MRV, refer to neurology for eval and LP to further elucidate,  plan to start diamox 250mg  po bid, discussed chronic doxycycline use, will defer to neurology and dermatology at this time, plan to f/u in 6 weeks for baseline VF testing  Vitreous Floaters OU: No sign of retinal tears or detachment on an exam today. Patient encouraged of retinal detachment precautions.

## 2022-08-14 NOTE — Progress Notes (Signed)
Subjective:    Patient ID: Theresa Carter, female    DOB: 1970/05/20, 52 y.o.   MRN: [id number]  HPI  Pt in for follow up.  Pt states she recently gained about 10 lbs.  This is despite 2 mg IM weekly injection of ozempic. Pt lost 40 lbs. Pt states since August or September progressive weight gain.  Pt states June last year started prozac. She attributed weight gain to prozac. Med as started by Wynelle Bourgeois MD.   Pt stats she had/has anxiety. Not depression. Pt is on buspar 15 mg twice a day.  Pt has decreased prozac to 30 mg daily for 4-5 weeks(half of her 60 mg dose).  Anxiety and mood is stable. She wants to further taper off.  Pt has intermittent flare of left hip pain 2 hip surgeries and back pain. Herniated disc in neck and lower back. Pt has intermittent severe flare of pain on occasion. Pt needs to have note to park at her office. 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  Htn- pt ran out of amlodipine last night. Also on losartan 100 mg daily.  Hx of neck pain and hip pain. She over the years have been on flexeril 10 mg she states used about 4-5 days a week. She found over years helped with pain and helped her sleep.    Review of Systems  HENT:  Negative for dental problem and ear discharge.   Respiratory:  Negative for cough, choking and wheezing.   Cardiovascular:  Negative for chest pain and palpitations.  Gastrointestinal:  Negative for abdominal pain.  Genitourinary:  Negative for dysuria.  Musculoskeletal:  Positive for neck pain.  Neurological:  Negative for dizziness, seizures and light-headedness.  Hematological:  Negative for adenopathy. Does not bruise/bleed easily.  Psychiatric/Behavioral:  Positive for sleep disturbance. Negative for behavioral problems.     Past Medical History:  Diagnosis Date   Asthma    Chest pain 07/29/2018   Diabetes mellitus without complication    Ectopic pregnancy    Hypertension       Social History   Socioeconomic History   Marital status: Married    Spouse name: Not on file   Number of children: Not on file   Years of education: Not on file   Highest education level: GED or equivalent  Occupational History   Not on file  Tobacco Use   Smoking status: Former   Smokeless tobacco: Never   Tobacco comments:    Quit 2001.  Vaping Use   Vaping Use: Never used  Substance and Sexual Activity   Alcohol use: Yes    Comment: RARE   Drug use: Never   Sexual activity: Not on file  Other Topics Concern   Not on file  Social History Narrative   Not on file   Social Determinants of Health   Financial Resource Strain: Low Risk  (08/14/2022)   Overall Financial Resource Strain (CARDIA)    Difficulty of Paying Living Expenses: Not very hard  Food Insecurity: No Food Insecurity (08/14/2022)   Hunger Vital Sign    Worried About Running Out of Food in the Last Year: Never true    Ran Out of Food in the Last Year: Never true  Transportation Needs: No Transportation Needs (08/14/2022)   PRAPARE - Administrator, Civil Service (Medical): No    Lack of Transportation (Non-Medical): No  Physical Activity: Insufficiently Active (08/14/2022)   Exercise Vital  Sign    Days of Exercise per Week: 1 day    Minutes of Exercise per Session: 60 min  Stress: No Stress Concern Present (08/14/2022)   [employer] of Occupational Health - Occupational Stress Questionnaire    Feeling of Stress : Only a little  Social Connections: Moderately Isolated (08/14/2022)   Social Connection and Isolation Panel [NHANES]    Frequency of Communication with Friends and Family: More than three times a week    Frequency of Social Gatherings with Friends and Family: Once a week    Attends Religious Services: Never    Database administrator or Organizations: No    Attends Engineer, structural: Not on file    Marital Status: Married  Catering manager Violence: Not on file     Past Surgical History:  Procedure Laterality Date   CHOLECYSTECTOMY     DILATION AND CURETTAGE OF UTERUS     ENDOMETRIAL ABLATION     HIP ARTHROSCOPY     x2   SALPINGECTOMY     TRIGGER FINGER RELEASE     TUBAL LIGATION     WRIST ARTHROSCOPY      Family History  Problem Relation Age of Onset   Venous thrombosis Mother    Breast cancer Mother    Lung cancer Father    Aneurysm Father        brain   Heart attack Brother    Stroke Brother    Venous thrombosis Son        Has antiphospholipid syndrome, and bechets disease.    No Known Allergies  Current Outpatient Medications on File Prior to Visit  Medication Sig Dispense Refill   acetaminophen (TYLENOL) 500 MG tablet Take 1,000 mg by mouth every 6 (six) hours as needed for mild pain.     Adalimumab (HUMIRA PEN) 40 MG/0.4ML PNKT Inject 40 mg into the skin once a week. Tuesday     albuterol (VENTOLIN HFA) 108 (90 Base) MCG/ACT inhaler Inhale 2 puffs into the lungs every 6 (six) hours as needed for wheezing or shortness of breath. 8 g 3   Blood Glucose Monitoring Suppl (ONETOUCH VERIO) w/Device KIT 1 kit by Does not apply route daily. Check blood glucose once daily as needed 1 kit 0   budesonide-formoterol (SYMBICORT) 160-4.5 MCG/ACT inhaler INHALE 2 PUFFS BY MOUTH TWICE A DAY 10.2 g 0   busPIRone (BUSPAR) 15 MG tablet TAKE 1 TABLET BY MOUTH 2 TIMES A DAY 60 tablet 0   clonazePAM (KLONOPIN) 0.5 MG tablet Take 0.5 tablets (0.25 mg total) by mouth 2 (two) times daily as needed for anxiety. 15 tablet 0   cyclobenzaprine (FLEXERIL) 10 MG tablet Take 1 tablet (10 mg total) by mouth at bedtime. 30 tablet 2   dutasteride (AVODART) 0.5 MG capsule Take one tablet daily on week days (Monday-Friday). Do not take if pregnant or trying to get pregnant.     FLUoxetine HCl 60 MG TABS TAKE 1 TABLET BY MOUTH DAILY 30 tablet 2   fluticasone (FLONASE) 50 MCG/ACT nasal spray USE 1 SPRAY INTO EACH NOSTRILS DAILY 16 mL 1   glucose blood (ONETOUCH  VERIO) test strip Check blood glucose once daily as needed 100 each 12   Insulin Pen Needle (BD PEN NEEDLE NANO 2ND GEN) 32G X 4 MM MISC USE TO INJECT DIABETIC MEDICATION INTO THE SKIN 1 TIME DAILY 100 each 3   levocetirizine (XYZAL) 5 MG tablet Take 1 tablet (5 mg total) by mouth every evening.  90 tablet 3   losartan (COZAAR) 100 MG tablet Take 1 tablet (100 mg total) by mouth daily. 100 tablet 3   montelukast (SINGULAIR) 10 MG tablet Take 1 tablet (10 mg total) by mouth at bedtime. 90 tablet 3   mupirocin ointment (BACTROBAN) 2 % Apply topically.     omeprazole (PRILOSEC) 40 MG capsule TAKE 1 CAPSULE BY MOUTH DAILY 90 capsule 0   OneTouch Delica Lancets 30G MISC Check blood sugar once daily as needed 100 each 12   rosuvastatin (CRESTOR) 5 MG tablet Take 1 tablet (5 mg total) by mouth daily. 90 tablet 1   Semaglutide, 2 MG/DOSE, (OZEMPIC, 2 MG/DOSE,) 8 MG/3ML SOPN Inject 2 mg into the skin once a week. 3 mL 5   No current facility-administered medications on file prior to visit.    BP 137/78   Pulse 80   Resp 18   Ht 5\' 5"  (1.651 m)   Wt 224 lb (101.6 kg)   SpO2 100%   BMI 37.28 kg/m        Objective:   Physical Exam  General Mental Status- Alert. General Appearance- Not in acute distress.   Skin General: Color- Normal Color. Moisture- Normal Moisture.  Neck Carotid Arteries- Normal color. Moisture- Normal Moisture. No carotid bruits. No JVD.  Chest and Lung Exam Auscultation: Breath Sounds:-Normal.  Cardiovascular Auscultation:Rythm- Regular. Murmurs & Other Heart Sounds:Auscultation of the heart reveals- No Murmurs.  Abdomen Inspection:-Inspeection Normal. Palpation/Percussion:Note:No mass. Palpation and Percussion of the abdomen reveal- Non Tender, Non Distended + BS, no rebound or guarding.  Neurologic Cranial Nerve exam:- CN III-XII intact(No nystagmus), symmetric smile. Strength:- 5/5 equal and symmetric strength both upper and lower extremities.        Assessment & Plan:   Patient Instructions  1. Generalized anxiety disorder Increase buspar to 15 mg three times daily as you taper of prozac.   2. Diabetes mellitus type II, non insulin dependent Continue ozempic 2 mg weekly IM.  3. Essential hypertension Refilled amlodpine. Continue losartan.  4. Weight gain You associated with weight gain. Will taper you off prozac.  5. Neck pain Flexeril 5 mg to use if needed at night. On review has helped with insomnia as well. I think better to use lower dose.  Future cmp and A1c placed. To get done end of May.  Follow up date to be determined after lab review. Sooner if needed.        Esperanza Richters, PA-C

## 2022-08-14 NOTE — Addendum Note (Signed)
Addended by: Gwenevere Abbot on: 08/14/2022 01:42 PM   Modules accepted: Orders

## 2022-08-17 DIAGNOSIS — G932 Benign intracranial hypertension: Secondary | ICD-10-CM | POA: Insufficient documentation

## 2022-08-20 DIAGNOSIS — H4710 Unspecified papilledema: Secondary | ICD-10-CM | POA: Diagnosis not present

## 2022-08-20 DIAGNOSIS — H43393 Other vitreous opacities, bilateral: Secondary | ICD-10-CM | POA: Diagnosis not present

## 2022-08-20 DIAGNOSIS — H5213 Myopia, bilateral: Secondary | ICD-10-CM | POA: Diagnosis not present

## 2022-08-20 DIAGNOSIS — G932 Benign intracranial hypertension: Secondary | ICD-10-CM | POA: Diagnosis not present

## 2022-08-20 DIAGNOSIS — E119 Type 2 diabetes mellitus without complications: Secondary | ICD-10-CM | POA: Diagnosis not present

## 2022-08-20 DIAGNOSIS — Z7984 Long term (current) use of oral hypoglycemic drugs: Secondary | ICD-10-CM | POA: Diagnosis not present

## 2022-08-21 ENCOUNTER — Encounter: Payer: Self-pay | Admitting: Medical

## 2022-08-21 NOTE — Telephone Encounter (Signed)
This is in Epic under care everywhere , pt is scheduled for MRI 09/19/2022 .Marland Kitchen Do we really need to schedule her because it looks like opthalmology has this handled?  ASSESSMENT/PLAN: 1. Type 2 diabetes mellitus without complication, without long-term current use of insulin (CMS/HCC)   2. Long term current use of oral hypoglycemic drug   3. Papilledema of both eyes OCT, Optic Nerve - OU - Both Eyes  Ambulatory referral to Neurology  MRI Orbit Face And/Or Neck W And WO Contrast  MRI Brain W And WO Contrast  MRV Head WO Contrast  CANCELED: OCT, Macula - OU - Both Eyes   4. Pseudotumor cerebri OCT, Optic Nerve - OU - Both Eyes  Ambulatory referral to Neurology  MRI Orbit Face And/Or Neck W And WO Contrast  MRI Brain W And WO Contrast   5. Vitreous floater, bilateral   6. Myopia of both eyes    Type II Diabetic: No evidence of retinopathy, encourage control of A1c.  Papilledema both eyes: detailed discussion regarding bilateral disc edema, plan to order MRI/MRV, refer to neurology for eval and LP to further elucidate, plan to start diamox 250mg  po bid, discussed chronic doxycycline use, will defer to neurology and dermatology at this time, plan to f/u in 6 weeks for baseline VF testing  Vitreous Floaters OU: No sign of retinal tears or detachment on an exam today. Patient encouraged of retinal detachment precautions.

## 2022-08-22 DIAGNOSIS — L910 Hypertrophic scar: Secondary | ICD-10-CM | POA: Diagnosis not present

## 2022-08-22 DIAGNOSIS — Z79899 Other long term (current) drug therapy: Secondary | ICD-10-CM | POA: Diagnosis not present

## 2022-08-22 DIAGNOSIS — L732 Hidradenitis suppurativa: Secondary | ICD-10-CM | POA: Diagnosis not present

## 2022-08-22 DIAGNOSIS — H4710 Unspecified papilledema: Secondary | ICD-10-CM | POA: Diagnosis not present

## 2022-08-22 DIAGNOSIS — L987 Excessive and redundant skin and subcutaneous tissue: Secondary | ICD-10-CM | POA: Diagnosis not present

## 2022-08-24 ENCOUNTER — Emergency Department (HOSPITAL_BASED_OUTPATIENT_CLINIC_OR_DEPARTMENT_OTHER): Payer: Medicare HMO

## 2022-08-24 ENCOUNTER — Encounter (HOSPITAL_BASED_OUTPATIENT_CLINIC_OR_DEPARTMENT_OTHER): Payer: Self-pay

## 2022-08-24 ENCOUNTER — Emergency Department (HOSPITAL_BASED_OUTPATIENT_CLINIC_OR_DEPARTMENT_OTHER)
Admission: EM | Admit: 2022-08-24 | Discharge: 2022-08-25 | Disposition: A | Discharge status: Home / Self Care | Payer: Medicare HMO | Attending: Emergency Medicine | Admitting: Emergency Medicine

## 2022-08-24 ENCOUNTER — Other Ambulatory Visit: Payer: Self-pay

## 2022-08-24 DIAGNOSIS — H4710 Unspecified papilledema: Secondary | ICD-10-CM | POA: Diagnosis not present

## 2022-08-24 DIAGNOSIS — M542 Cervicalgia: Secondary | ICD-10-CM | POA: Diagnosis not present

## 2022-08-24 DIAGNOSIS — R519 Headache, unspecified: Secondary | ICD-10-CM

## 2022-08-24 DIAGNOSIS — Z794 Long term (current) use of insulin: Secondary | ICD-10-CM | POA: Insufficient documentation

## 2022-08-24 DIAGNOSIS — I10 Essential (primary) hypertension: Secondary | ICD-10-CM | POA: Diagnosis not present

## 2022-08-24 LAB — COMPREHENSIVE METABOLIC PANEL
ALT: 18 U/L (ref 0–44)
AST: 15 U/L (ref 15–41)
Albumin: 4.4 g/dL (ref 3.5–5.0)
Alkaline Phosphatase: 72 U/L (ref 38–126)
Anion gap: 9 (ref 5–15)
BUN: 16 mg/dL (ref 6–20)
CO2: 22 mmol/L (ref 22–32)
Calcium: 9.6 mg/dL (ref 8.9–10.3)
Chloride: 107 mmol/L (ref 98–111)
Creatinine, Ser: 0.94 mg/dL (ref 0.44–1.00)
GFR, Estimated: >60 mL/min (ref >60)
Glucose, Bld: 141 mg/dL — ABNORMAL HIGH (ref 70–99)
Potassium: 3.9 mmol/L (ref 3.5–5.1)
Sodium: 138 mmol/L (ref 135–145)
Total Bilirubin: 0.5 mg/dL (ref 0.3–1.2)
Total Protein: 7.9 g/dL (ref 6.5–8.1)

## 2022-08-24 LAB — CBC WITH DIFFERENTIAL/PLATELET
Abs Immature Granulocytes: 0.05 10*3/uL (ref 0.00–0.07)
Basophils Absolute: 0 10*3/uL (ref 0.0–0.1)
Basophils Relative: 0 %
Eosinophils Absolute: 0.2 10*3/uL (ref 0.0–0.5)
Eosinophils Relative: 2 %
HCT: 38.3 % (ref 36.0–46.0)
Hemoglobin: 12.3 g/dL (ref 12.0–15.0)
Immature Granulocytes: 1 %
Lymphocytes Relative: 37 %
Lymphs Abs: 4 10*3/uL (ref 0.7–4.0)
MCH: 29.8 pg (ref 26.0–34.0)
MCHC: 32.1 g/dL (ref 30.0–36.0)
MCV: 92.7 fL (ref 80.0–100.0)
Monocytes Absolute: 0.6 10*3/uL (ref 0.1–1.0)
Monocytes Relative: 5 %
Neutro Abs: 6.1 10*3/uL (ref 1.7–7.7)
Neutrophils Relative %: 55 %
Platelets: 396 10*3/uL (ref 150–400)
RBC: 4.13 MIL/uL (ref 3.87–5.11)
RDW: 13.2 % (ref 11.5–15.5)
WBC: 10.9 10*3/uL — ABNORMAL HIGH (ref 4.0–10.5)
nRBC: 0 % (ref 0.0–0.2)

## 2022-08-24 MED ORDER — PROCHLORPERAZINE EDISYLATE 10 MG/2ML IJ SOLN
10.0000 mg | Freq: Once | INTRAMUSCULAR | Status: AC
  Administered 2022-08-24: 10 mg via INTRAVENOUS
  Filled 2022-08-24: qty 2

## 2022-08-24 MED ORDER — DIPHENHYDRAMINE HCL 50 MG/ML IJ SOLN
25.0000 mg | Freq: Once | INTRAMUSCULAR | Status: AC
  Administered 2022-08-24: 25 mg via INTRAVENOUS
  Filled 2022-08-24: qty 1

## 2022-08-24 NOTE — ED Triage Notes (Signed)
Pt states she was sitting on the couch this evening, began feeling lightheaded & nauseous. Reports occipital HA, mouth/ nose numbness. States she went to routine eye exam earlier this week, advised her optic nerves were found to "be swollen in the back."

## 2022-08-24 NOTE — ED Provider Notes (Signed)
[place] EMERGENCY DEPARTMENT AT [facility] Provider Note   CSN: [id number] Arrival date & time: 08/24/22  1842     History  Chief Complaint  Patient presents with   Headache   Nausea    Theresa Carter is a 52 y.o. female.  52 yo F with a chief complaints of a headache.  This is mostly occipital but she also feels like she has pain in her eyes.  She has had trouble with headaches for a while but feels like this suddenly got worse about 6:00.  She denies one-sided numbness or weakness denies difficulty speech or swallowing.  Denies trauma denies fevers denies cough or congestion.  She has had a sensation of being subjectively hot and cold.  She recently saw an ophthalmologist and was diagnosed with bilateral papilledema.  She is scheduled to see neurology next week for MRI and likely LP.   Headache      Home Medications Prior to Admission medications   Medication Sig Start Date End Date Taking? Authorizing Provider  acetaminophen (TYLENOL) 500 MG tablet Take 1,000 mg by mouth every 6 (six) hours as needed for mild pain.    [provider]  Adalimumab (HUMIRA PEN) 40 MG/0.4ML PNKT Inject 40 mg into the skin once a week. Tuesday 09/29/20   [provider]  albuterol (VENTOLIN HFA) 108 (90 Base) MCG/ACT inhaler Inhale 2 puffs into the lungs every 6 (six) hours as needed for wheezing or shortness of breath. 11/10/20   Verdene Lennert, MD  amLODipine (NORVASC) 5 MG tablet Take 1 tablet (5 mg total) by mouth daily. 08/14/22 08/14/23  Saguier, Ramon Dredge, PA-C  Blood Glucose Monitoring Suppl (ONETOUCH VERIO) w/Device KIT 1 kit by Does not apply route daily. Check blood glucose once daily as needed 12/21/20   Gardenia Phlegm, MD  budesonide-formoterol [facility]) 160-4.5 MCG/ACT inhaler INHALE 2 PUFFS BY MOUTH TWICE A DAY 08/06/22   Saguier, Ramon Dredge, PA-C  busPIRone (BUSPAR) 15 MG tablet TAKE 1 TABLET BY MOUTH 2 TIMES A DAY 08/10/22   Saguier, Ramon Dredge, PA-C  clonazePAM  (KLONOPIN) 0.5 MG tablet Take 0.5 tablets (0.25 mg total) by mouth 2 (two) times daily as needed for anxiety. 01/22/22   Merrilyn Puma, MD  cyclobenzaprine (FLEXERIL) 5 MG tablet 1 tab po q hs prn neck pain 08/14/22   Saguier, Ramon Dredge, PA-C  dutasteride (AVODART) 0.5 MG capsule Take one tablet daily on week days (Monday-Friday). Do not take if pregnant or trying to get pregnant. 04/26/21   [provider]  FLUoxetine (PROZAC) 20 MG capsule Take 1 capsule (20 mg total) by mouth daily. 08/14/22   Saguier, Ramon Dredge, PA-C  FLUoxetine HCl 60 MG TABS TAKE 1 TABLET BY MOUTH DAILY 06/23/22   Saguier, Ramon Dredge, PA-C  fluticasone [facility]) 50 MCG/ACT nasal spray USE 1 SPRAY INTO EACH NOSTRILS DAILY 03/20/22   Lyndle Herrlich, MD  glucose blood (ONETOUCH VERIO) test strip Check blood glucose once daily as needed 12/21/20   Gardenia Phlegm, MD  Insulin Pen Needle (BD PEN NEEDLE NANO 2ND GEN) 32G X 4 MM MISC USE TO INJECT DIABETIC MEDICATION INTO THE SKIN 1 TIME DAILY 03/20/22   Lyndle Herrlich, MD  levocetirizine (XYZAL) 5 MG tablet Take 1 tablet (5 mg total) by mouth every evening. 07/13/22   Saguier, Ramon Dredge, PA-C  losartan (COZAAR) 100 MG tablet Take 1 tablet (100 mg total) by mouth daily. 07/21/21   Verdene Lennert, MD  montelukast (SINGULAIR) 10 MG tablet Take 1 tablet (10 mg total)  by mouth at bedtime. 05/28/22   Lyndle Herrlich, MD  mupirocin ointment (BACTROBAN) 2 % Apply topically. 11/15/21   [provider]  omeprazole (PRILOSEC) 40 MG capsule TAKE 1 CAPSULE BY MOUTH DAILY 05/15/22   Lyndle Herrlich, MD  OneTouch Delica Lancets 30G MISC Check blood sugar once daily as needed 12/21/20   Gardenia Phlegm, MD  rosuvastatin (CRESTOR) 5 MG tablet Take 1 tablet (5 mg total) by mouth daily. 03/20/22   Lyndle Herrlich, MD  Semaglutide, 2 MG/DOSE, (OZEMPIC, 2 MG/DOSE,) 8 MG/3ML SOPN Inject 2 mg into the skin once a week. 07/20/21   Verdene Lennert, MD      Allergies    Patient  has no known allergies.    Review of Systems   Review of Systems  Neurological:  Positive for headaches.    Physical Exam Updated Vital Signs BP (!) 148/78   Pulse 77   Temp 98.1 F (36.7 C) (Oral)   Resp 18   SpO2 100%  Physical Exam Vitals and nursing note reviewed.  Constitutional:      General: She is not in acute distress.    Appearance: She is well-developed. She is not diaphoretic.  HENT:     Head: Normocephalic and atraumatic.  Eyes:     Pupils: Pupils are equal, round, and reactive to light.  Cardiovascular:     Rate and Rhythm: Normal rate and regular rhythm.     Heart sounds: No murmur heard.    No friction rub. No gallop.  Pulmonary:     Effort: Pulmonary effort is normal.     Breath sounds: No wheezing or rales.  Abdominal:     General: There is no distension.     Palpations: Abdomen is soft.     Tenderness: There is no abdominal tenderness.  Musculoskeletal:        General: No tenderness.     Cervical back: Normal range of motion and neck supple.  Skin:    General: Skin is warm and dry.  Neurological:     Mental Status: She is alert and oriented to person, place, and time.     GCS: GCS eye subscore is 4. GCS verbal subscore is 5. GCS motor subscore is 6.     Cranial Nerves: Cranial nerves 2-12 are intact.     Sensory: Sensation is intact.     Motor: Motor function is intact.     Coordination: Coordination is intact.     Comments: Benign neurologic exam  Psychiatric:        Behavior: Behavior normal.     ED Results / Procedures / Treatments   Labs (all labs ordered are listed, but only abnormal results are displayed) Labs Reviewed  COMPREHENSIVE METABOLIC PANEL - Abnormal; Notable for the following components:      Result Value   Glucose, Bld 141 (*)    All other components within normal limits  CBC WITH DIFFERENTIAL/PLATELET - Abnormal; Notable for the following components:   WBC 10.9 (*)    All other components within normal limits     EKG None  Radiology CT Cervical Spine Wo Contrast  Result Date: 08/24/2022 CLINICAL DATA:  Neck pain, acute, no red flags EXAM: CT CERVICAL SPINE WITHOUT CONTRAST TECHNIQUE: Multidetector CT imaging of the cervical spine was performed without intravenous contrast. Multiplanar CT image reconstructions were also generated. RADIATION DOSE REDUCTION: This exam was performed according to the departmental dose-optimization program which includes automated exposure control, adjustment of the mA and/or kV  according to patient size and/or use of iterative reconstruction technique. COMPARISON:  None Available. FINDINGS: Alignment: 2 mm retrolisthesis C5-6, likely degenerative in nature. Overall straightening of the cervical spine. Skull base and vertebrae: Craniocervical alignment is normal. The atlantodental interval is not widened. No acute fracture of the cervical spine. Vertebral body height is preserved. Soft tissues and spinal canal: Posterior disc osteophyte complex at C5-6 results in moderate central canal stenosis with abutment and mild flattening of the thecal sac. AP diameter of the spinal canal at this level measures 6 mm. No canal hematoma. No prevertebral soft tissue swelling. Disc levels: Intervertebral disc space narrowing and endplate remodeling at C5-6 in keeping with moderate degenerative disc disease. Milder degenerative changes are seen at C4-5 and C6-7. Prevertebral soft tissues are not thickened on sagittal reformats. Uncovertebral arthrosis results in severe right neuroforaminal narrowing at C5-6. Moderate left neuroforaminal narrowing at C3-4 and C5-6. Upper chest: Negative. Other: Multiple thyroid nodules, largest 17 mm nodule noted within the right thyroid lobe, not well characterized on this exam. IMPRESSION: 1. No acute fracture or listhesis of the cervical spine. 2. Multilevel degenerative disc and degenerative joint disease resulting in multilevel neuroforaminal narrowing as described  above, most severe on the right at C5-6. 3. Posterior disc osteophyte complex at C5-6 results in moderate central canal stenosis with abutment and mild flattening of the thecal sac. 4. Incidental right thyroid nodule measuring 1.7 cm. Recommend non-emergent thyroid ultrasound. Reference: J Am Coll Radiol. 2015 Feb;12(2): 143-50 Electronically Signed   By: Helyn Numbers M.D.   On: 08/24/2022 20:28   CT Head Wo Contrast  Result Date: 08/24/2022 CLINICAL DATA:  Headache EXAM: CT HEAD WITHOUT CONTRAST TECHNIQUE: Contiguous axial images were obtained from the base of the skull through the vertex without intravenous contrast. RADIATION DOSE REDUCTION: This exam was performed according to the departmental dose-optimization program which includes automated exposure control, adjustment of the mA and/or kV according to patient size and/or use of iterative reconstruction technique. COMPARISON:  None Available. FINDINGS: Brain: No evidence of acute infarction, hemorrhage, hydrocephalus, extra-axial collection or mass lesion/mass effect. Vascular: No hyperdense vessel or unexpected calcification. Skull: Normal. Negative for fracture or focal lesion. Sinuses/Orbits: No acute finding. Other: None. IMPRESSION: No acute intracranial abnormality. Electronically Signed   By: Darliss Cheney M.D.   On: 08/24/2022 20:23    Procedures Procedures    Medications Ordered in ED Medications  prochlorperazine (COMPAZINE) injection 10 mg (10 mg Intravenous Given 08/24/22 2204)  diphenhydrAMINE (BENADRYL) injection 25 mg (25 mg Intravenous Given 08/24/22 2204)    ED Course/ Medical Decision Making/ A&P                             Medical Decision Making Amount and/or Complexity of Data Reviewed Labs: ordered. Radiology: ordered.  Risk Prescription drug management.   52 yo F with a chief complaints of headache.  This been going on for a few hours now.  She actually has had headaches off and on for some time but feels like  this got worse a few hours ago.  She had a CT scan done here that was negative for subarachnoid.  She has a benign neurologic exam.  On my record review she had recently seen ophthalmology and was found to have bilateral papilledema.  She is currently scheduled to see neurology next week for MRI MRV and LP.  Discussed case with Dr.Bhagat, recommends MRI with and without contrast of  the brain.  MRV.  I discussed case with Dr. Jacqulyn Bath who accepts patient in ED to ED transfer.  The patients results and plan were reviewed and discussed.   Any x-rays performed were independently reviewed by myself.   Differential diagnosis were considered with the presenting HPI.  Medications  prochlorperazine (COMPAZINE) injection 10 mg (10 mg Intravenous Given 08/24/22 2204)  diphenhydrAMINE (BENADRYL) injection 25 mg (25 mg Intravenous Given 08/24/22 2204)    Vitals:   08/24/22 1949 08/24/22 2130  BP: (!) 182/93 (!) 148/78  Pulse: 92 77  Resp: (!) 22 18  Temp: 98.1 F (36.7 C)   TempSrc: Oral   SpO2: 98% 100%    Final diagnoses:  Papilledema            Final Clinical Impression(s) / ED Diagnoses Final diagnoses:  Papilledema    Rx / DC Orders ED Discharge Orders     None         Melene Plan, DO 08/24/22 2329

## 2022-08-25 ENCOUNTER — Emergency Department (HOSPITAL_COMMUNITY): Payer: Medicare HMO

## 2022-08-25 DIAGNOSIS — I10 Essential (primary) hypertension: Secondary | ICD-10-CM | POA: Diagnosis not present

## 2022-08-25 MED ORDER — GADOBUTROL 1 MMOL/ML IV SOLN: 10.0000 mL | Freq: Once | INTRAVENOUS | Status: DC | PRN

## 2022-08-25 MED ORDER — GADOBUTROL 1 MMOL/ML IV SOLN
10.0000 mL | Freq: Once | INTRAVENOUS | Status: AC | PRN
  Administered 2022-08-25: 10 mL via INTRAVENOUS

## 2022-08-25 MED ORDER — PROMETHAZINE HCL 25 MG PO TABS: 25.0000 mg | ORAL_TABLET | Freq: Three times a day (TID) | ORAL | 0 refills | Status: DC | PRN

## 2022-08-25 NOTE — Discharge Instructions (Addendum)
Please follow up with neurologist on 5/9 Dr Alton Revere for further evaluation   It was a pleasure caring for you today in the emergency department.  Please return to the emergency department for any worsening or worrisome symptoms.

## 2022-08-25 NOTE — ED Provider Notes (Signed)
  Provider Note MRN:  [id number]  Arrival date & time: 08/25/22    ED Course and Medical Decision Making  Assumed care from Dr Adela Lank as transfer for MRI  See note from prior team for complete details, in brief:  52 yo female Papilledema at optho  Ha/ nausea Sent here for MRI after d/w neuro Dr Iver Nestle MRI resulted, concern for IIH  Plan per prior physician f/u MRI  MRI reviewed D/w Dr Iver Nestle with neurology, okay for discharge at this point as symptoms improved, ha nearly resolved, vision improved Has appt w/ neuro on 5/9 for further eval/LP Pt ready to go home at this point   The patient improved significantly and was discharged in stable condition. Detailed discussions were had with the patient regarding current findings, and need for close f/u with PCP or on call doctor. The patient has been instructed to return immediately if the symptoms worsen in any way for re-evaluation. Patient verbalized understanding and is in agreement with current care plan. All questions answered prior to discharge.    Procedures  Final Clinical Impressions(s) / ED Diagnoses     ICD-10-CM   1. Papilledema  H47.10     2. Nonintractable headache, unspecified chronicity pattern, unspecified headache type  R51.9       ED Discharge Orders          Ordered    promethazine (PHENERGAN) 25 MG tablet  Every 8 hours PRN        08/25/22 0446              Discharge Instructions      Please follow up with neurologist on 5/9 Dr Alton Revere for further evaluation   It was a pleasure caring for you today in the emergency department.  Please return to the emergency department for any worsening or worrisome symptoms.          Sloan Leiter, DO 08/25/22 [phone]

## 2022-08-27 ENCOUNTER — Encounter: Payer: Self-pay | Admitting: Medical

## 2022-08-28 MED ORDER — OMEPRAZOLE 40 MG PO CPDR: 40.0000 mg | DELAYED_RELEASE_CAPSULE | Freq: Every day | ORAL | 3 refills | Status: DC

## 2022-08-28 NOTE — Addendum Note (Signed)
Addended by: Gwenevere Abbot on: 08/28/2022 12:51 PM   Modules accepted: Orders

## 2022-08-30 DIAGNOSIS — H4710 Unspecified papilledema: Secondary | ICD-10-CM | POA: Diagnosis not present

## 2022-08-30 DIAGNOSIS — G932 Benign intracranial hypertension: Secondary | ICD-10-CM | POA: Insufficient documentation

## 2022-09-04 ENCOUNTER — Other Ambulatory Visit: Payer: Self-pay | Admitting: Medical

## 2022-09-05 ENCOUNTER — Telehealth: Payer: Self-pay | Admitting: Medical

## 2022-09-05 NOTE — Telephone Encounter (Signed)
Called patient to schedule Medicare Annual Wellness Visit (AWV). Left message for patient to call back and schedule Medicare Annual Wellness Visit (AWV).  Due AWV-I as of : 10/21/2021  Please schedule an appointment at any time with [facility].  If any questions, please contact me at [phone].  Thank you ,  Randon Goldsmith Care Guide [facility] AWV TEAM Direct Dial: [phone]

## 2022-09-09 ENCOUNTER — Other Ambulatory Visit: Payer: Self-pay | Admitting: Medical

## 2022-09-14 ENCOUNTER — Other Ambulatory Visit: Payer: Self-pay

## 2022-09-14 DIAGNOSIS — H4710 Unspecified papilledema: Secondary | ICD-10-CM | POA: Diagnosis not present

## 2022-09-14 DIAGNOSIS — G932 Benign intracranial hypertension: Secondary | ICD-10-CM | POA: Diagnosis not present

## 2022-09-17 NOTE — Telephone Encounter (Signed)
Rx refill crestor sent.

## 2022-09-18 ENCOUNTER — Telehealth: Payer: Self-pay

## 2022-09-18 ENCOUNTER — Ambulatory Visit (INDEPENDENT_AMBULATORY_CARE_PROVIDER_SITE_OTHER): Payer: Medicare HMO | Admitting: Medical

## 2022-09-18 ENCOUNTER — Encounter: Payer: Self-pay | Admitting: Medical

## 2022-09-18 VITALS — BP 136/72 | HR 74 | Resp 18 | Ht 65.0 in | Wt 225.0 lb

## 2022-09-18 DIAGNOSIS — E041 Nontoxic single thyroid nodule: Secondary | ICD-10-CM

## 2022-09-18 DIAGNOSIS — F419 Anxiety disorder, unspecified: Secondary | ICD-10-CM | POA: Diagnosis not present

## 2022-09-18 DIAGNOSIS — M5410 Radiculopathy, site unspecified: Secondary | ICD-10-CM | POA: Diagnosis not present

## 2022-09-18 DIAGNOSIS — M542 Cervicalgia: Secondary | ICD-10-CM | POA: Diagnosis not present

## 2022-09-18 DIAGNOSIS — Z7985 Long-term (current) use of injectable non-insulin antidiabetic drugs: Secondary | ICD-10-CM | POA: Diagnosis not present

## 2022-09-18 DIAGNOSIS — E669 Obesity, unspecified: Secondary | ICD-10-CM

## 2022-09-18 DIAGNOSIS — R5383 Other fatigue: Secondary | ICD-10-CM | POA: Diagnosis not present

## 2022-09-18 DIAGNOSIS — Z6837 Body mass index (BMI) 37.0-37.9, adult: Secondary | ICD-10-CM

## 2022-09-18 DIAGNOSIS — E119 Type 2 diabetes mellitus without complications: Secondary | ICD-10-CM

## 2022-09-18 NOTE — Telephone Encounter (Signed)
Theresa Carter I spoke to the patient she stated she is approved for ozempic until December 2024. Patient stated the provider she is seeing is requesting information from [place] in order to start the process there, Im not sure what information they need is there a number for Theresa Carter to call or can her information be transferred to the practice she is now going to. If needed please call the patient.

## 2022-09-18 NOTE — Telephone Encounter (Signed)
Patient called she is requesting samples of Ozempic, patient stated she needs them asap.

## 2022-09-18 NOTE — Patient Instructions (Addendum)
1. Thyroid nodule Evaluate by [place] per radiologist advisement. Will include lab as wel. - US THYROID; Future - TSH - T4, free  2. Fatigue, unspecified type  - TSH - T4, free - B12 - Vitamin B1 - Iron  Repeat cbc and cmp appear good just 3 weeks ago. No signicant abnormality.   3. Anxiety Controlled with buspar.  4. Diabetes mellitus type II, non insulin dependent (HCC) Last A1c controlled. Continue ozempic. Let us know if having trouble getting refilled thru old pcp office or if we need to somehow get med shipped to our office  5. Neck pain with radicular type upper ext symptoms. Continue with flexeril.  Refer to neurosurgeon.   6. Obesity (BMI 30-39.9) Continue diet for wt loss. Getting secondary benefit from ozempic and weight lost of 5 pounds as you stopped prozac.  Follow up in  3 month or sooner if needed

## 2022-09-18 NOTE — Telephone Encounter (Signed)
Spoke with pt regarding PAP re-enrollment. Informed patient she will have to renew with her new PCP. Her enrollment ended Dec 2023. Pt agreed to have application sent to her via mychart and she will take provider pages to new office for completion.

## 2022-09-18 NOTE — Progress Notes (Signed)
Subjective:    Patient ID: Theresa Carter, female    DOB: 01-Aug-1970, 52 y.o.   MRN: [id number]  HPI  Pt in for follow up on last visit.   Last AVS was below in ".  " 1. Generalized anxiety disorder Increase buspar to 15 mg three times daily as you taper of prozac.    2. Diabetes mellitus type II, non insulin dependent Continue ozempic 2 mg weekly IM.   3. Essential hypertension Refilled amlodpine. Continue losartan.   4. Weight gain You associated with weight gain. Will taper you off prozac.   5. Neck pain Flexeril 5 mg to use if needed at night. On review has helped with insomnia as well. I think better to use lower dose.   Future cmp and A1c placed. To get done end of May.   Follow up date to be determined after lab review. Sooner if needed.    "    Pt has on recent CT neck. Other: Multiple thyroid nodules, largest 17 mm nodule noted within the right thyroid lobe, not well characterized on this exam.   IMPRESSION: 1. No acute fracture or listhesis of the cervical spine. 2. Multilevel degenerative disc and degenerative joint disease resulting in multilevel neuroforaminal narrowing as described above, most severe on the right at C5-6. 3. Posterior disc osteophyte complex at C5-6 results in moderate central canal stenosis with abutment and mild flattening of the thecal sac. 4. Incidental right thyroid nodule measuring 1.7 cm. Recommend non-emergent thyroid ultrasound.   Also has seen neurologist since last visit.   "MRI brain/MRV brain 08/25/22:[place]: Partial empty sella and narrowing of the distal transverse  sinuses near the transverse-sigmoid junction, which are nonspecific  but can be seen in the setting of idiopathic intracranial  hypertension.   Pt does have some neck pain with some pain that radiates to both hands. Sometimes hands feel week with decreased grip strength. Pt states she had neck pain for years.    IMPRESSION:  Papilledema/pseudotumor  PLAN:  LP under flouro, ASAP Weight loss Increase Diamox 250 mg bid  Follow up 4 weeks"   Anxiety is tolerarable/ controlled with buspar alone   Pt did stop prozac an did loose 5 pounds.   Diabetes- still on ozempic. Last A1c was 6.0.  Htn- bp controlled on amlodipine.   Pt does have some neck pain with some pain that radiates to both hands. Sometimes hands feel week with decreased grip strength. Pt states she had neck pain for years. Reviewed CT neck findings with pt today.  Pt does not daily fatigue in addition to the above.     Review of Systems  Constitutional:  Positive for fatigue. Negative for chills and fever.  Respiratory:  Negative for chest tightness, shortness of breath and wheezing.   Cardiovascular:  Negative for chest pain and palpitations.  Gastrointestinal: Negative.  Negative for abdominal pain.  Genitourinary:  Negative for dyspareunia, dysuria, flank pain, frequency and hematuria.  Musculoskeletal:  Positive for neck pain. Negative for back pain, joint swelling and myalgias.  Skin:  Negative for rash.  Neurological:  Negative for tremors, seizures, facial asymmetry, speech difficulty, weakness and light-headedness.       Radicular pain.    Past Medical History:  Diagnosis Date   Asthma    Chest pain 07/29/2018   Diabetes mellitus without complication [facility])    Ectopic pregnancy    Hypertension      Social History   Socioeconomic History  Marital status: Married    Spouse name: Not on file   Number of children: Not on file   Years of education: Not on file   Highest education level: GED or equivalent  Occupational History   Not on file  Tobacco Use   Smoking status: Former   Smokeless tobacco: Never   Tobacco comments:    Quit 2001.  Vaping Use   Vaping Use: Never used  Substance and Sexual Activity   Alcohol use: Yes    Comment: RARE   Drug use: Never   Sexual activity: Not on file   Other Topics Concern   Not on file  Social History Narrative   Not on file   Social Determinants of Health   Financial Resource Strain: Low Risk  (08/14/2022)   Overall Financial Resource Strain (CARDIA)    Difficulty of Paying Living Expenses: Not very hard  Food Insecurity: No Food Insecurity (08/14/2022)   Hunger Vital Sign    Worried About Running Out of Food in the Last Year: Never true    Ran Out of Food in the Last Year: Never true  Transportation Needs: No Transportation Needs (08/14/2022)   PRAPARE - Administrator, Civil Service (Medical): No    Lack of Transportation (Non-Medical): No  Physical Activity: Insufficiently Active (08/14/2022)   Exercise Vital Sign    Days of Exercise per Week: 1 day    Minutes of Exercise per Session: 60 min  Stress: No Stress Concern Present (08/14/2022)   [employer] of Occupational Health - Occupational Stress Questionnaire    Feeling of Stress : Only a little  Social Connections: Moderately Isolated (08/14/2022)   Social Connection and Isolation Panel [NHANES]    Frequency of Communication with Friends and Family: More than three times a week    Frequency of Social Gatherings with Friends and Family: Once a week    Attends Religious Services: Never    Database administrator or Organizations: No    Attends Engineer, structural: Not on file    Marital Status: Married  Catering manager Violence: Not on file    Past Surgical History:  Procedure Laterality Date   CHOLECYSTECTOMY     DILATION AND CURETTAGE OF UTERUS     ENDOMETRIAL ABLATION     HIP ARTHROSCOPY     x2   SALPINGECTOMY     TRIGGER FINGER RELEASE     TUBAL LIGATION     WRIST ARTHROSCOPY      Family History  Problem Relation Age of Onset   Venous thrombosis Mother    Breast cancer Mother    Lung cancer Father    Aneurysm Father        brain   Heart attack Brother    Stroke Brother    Venous thrombosis Son        Has antiphospholipid  syndrome, and bechets disease.    No Known Allergies  Current Outpatient Medications on File Prior to Visit  Medication Sig Dispense Refill   acetaminophen (TYLENOL) 500 MG tablet Take 1,000 mg by mouth every 6 (six) hours as needed for mild pain.     Adalimumab (HUMIRA PEN) 40 MG/0.4ML PNKT Inject 40 mg into the skin once a week. Tuesday     albuterol (VENTOLIN HFA) 108 (90 Base) MCG/ACT inhaler Inhale 2 puffs into the lungs every 6 (six) hours as needed for wheezing or shortness of breath. 8 g 3   amLODipine (NORVASC) 5 MG tablet  Take 1 tablet (5 mg total) by mouth daily. 90 tablet 3   Blood Glucose Monitoring Suppl (ONETOUCH VERIO) w/Device KIT 1 kit by Does not apply route daily. Check blood glucose once daily as needed 1 kit 0   budesonide-formoterol (SYMBICORT) 160-4.5 MCG/ACT inhaler INHALE 2 PUFFS BY MOUTH TWICE A DAY 10.2 g 0   busPIRone (BUSPAR) 15 MG tablet TAKE 1 TABLET BY MOUTH 2 TIMES A DAY 60 tablet 0   clonazePAM (KLONOPIN) 0.5 MG tablet Take 0.5 tablets (0.25 mg total) by mouth 2 (two) times daily as needed for anxiety. 15 tablet 0   cyclobenzaprine (FLEXERIL) 5 MG tablet 1 tab po q hs prn neck pain 20 tablet 1   dutasteride (AVODART) 0.5 MG capsule Take one tablet daily on week days (Monday-Friday). Do not take if pregnant or trying to get pregnant.     FLUoxetine (PROZAC) 20 MG capsule Take 1 capsule (20 mg total) by mouth daily. 14 capsule 0   FLUoxetine HCl 60 MG TABS TAKE 1 TABLET BY MOUTH DAILY 30 tablet 2   fluticasone (FLONASE) 50 MCG/ACT nasal spray USE 1 SPRAY INTO EACH NOSTRILS DAILY 16 mL 1   glucose blood (ONETOUCH VERIO) test strip Check blood glucose once daily as needed 100 each 12   Insulin Pen Needle (BD PEN NEEDLE NANO 2ND GEN) 32G X 4 MM MISC USE TO INJECT DIABETIC MEDICATION INTO THE SKIN 1 TIME DAILY 100 each 3   levocetirizine (XYZAL) 5 MG tablet Take 1 tablet (5 mg total) by mouth every evening. 90 tablet 3   losartan (COZAAR) 100 MG tablet Take 1  tablet (100 mg total) by mouth daily. 100 tablet 3   montelukast (SINGULAIR) 10 MG tablet Take 1 tablet (10 mg total) by mouth at bedtime. 90 tablet 3   mupirocin ointment (BACTROBAN) 2 % Apply topically.     omeprazole (PRILOSEC) 40 MG capsule Take 1 capsule (40 mg total) by mouth daily. 90 capsule 3   OneTouch Delica Lancets 30G MISC Check blood sugar once daily as needed 100 each 12   promethazine (PHENERGAN) 25 MG tablet Take 1 tablet (25 mg total) by mouth every 8 (eight) hours as needed (headache). 10 tablet 0   rosuvastatin (CRESTOR) 5 MG tablet TAKE 1 TABLET BY MOUTH DAILY 90 tablet 1   Semaglutide, 2 MG/DOSE, (OZEMPIC, 2 MG/DOSE,) 8 MG/3ML SOPN Inject 2 mg into the skin once a week. 3 mL 5   No current facility-administered medications on file prior to visit.    BP 136/72   Pulse 74   Resp 18   Ht 5\' 5"  (1.651 m)   Wt 225 lb (102.1 kg)   SpO2 100%   BMI 37.44 kg/m        Objective:   Physical Exam  General Mental Status- Alert. General Appearance- Not in acute distress.   Skin General: Color- Normal Color. Moisture- Normal Moisture.  Neck Carotid Arteries- Normal color. Moisture- Normal Moisture. No carotid bruits. No JVD.  Chest and Lung Exam Auscultation: Breath Sounds:-Normal.  Cardiovascular Auscultation:Rythm- Regular. Murmurs & Other Heart Sounds:Auscultation of the heart reveals- No Murmurs.  Abdomen Inspection:-Inspeection Normal. Palpation/Percussion:Note:No mass. Palpation and Percussion of the abdomen reveal- Non Tender, Non Distended + BS, no rebound or guarding.   Neurologic Cranial Nerve exam:- CN III-XII intact(No nystagmus), symmetric smile. Strength:- 5/5 equal and symmetric strength both upper and lower extremities.       Assessment & Plan:   Patient Instructions  1. Thyroid nodule  Evaluate by [place] per radiologist advisement. Will include lab as wel. - US THYROID; Future - TSH - T4, free  2. Fatigue, unspecified type  - TSH -  T4, free - B12 - Vitamin B1 - Iron  Repeat cbc and cmp appear good just 3 weeks ago. No signicant abnormality.   3. Anxiety Controlled with buspar.  4. Diabetes mellitus type II, non insulin dependent (HCC) Last A1c controlled. Continue ozempic. Let us know if having trouble getting refilled thru old pcp office or if we need to somehow get med shipped to our office  5. Neck pain with radicular type upper ext symptoms. Continue with flexeril.  Refer to neurosurgeon.   6. Obesity (BMI 30-39.9) Continue diet for wt loss. Getting secondary benefit from ozempic and weight lost of 5 pounds as you stopped prozac.  Follow up in  3 month or sooner if needed     [employer], PA-C

## 2022-09-18 NOTE — Telephone Encounter (Signed)
Called patient and lvm regarding the previous message.

## 2022-09-19 ENCOUNTER — Telehealth (HOSPITAL_BASED_OUTPATIENT_CLINIC_OR_DEPARTMENT_OTHER): Payer: Self-pay

## 2022-09-19 LAB — T4, FREE: Free T4: 0.67 ng/dL (ref 0.60–1.60)

## 2022-09-19 LAB — VITAMIN B12: Vitamin B-12: 150 pg/mL — ABNORMAL LOW (ref 211–911)

## 2022-09-19 LAB — IRON: Iron: 58 ug/dL (ref 42–145)

## 2022-09-19 LAB — TSH: TSH: 0.44 u[IU]/mL (ref 0.35–5.50)

## 2022-09-19 MED ORDER — OZEMPIC (2 MG/DOSE) 8 MG/3ML ~~LOC~~ SOPN: 2.0000 mg | PEN_INJECTOR | SUBCUTANEOUS | 1 refills | Status: DC

## 2022-09-19 NOTE — Telephone Encounter (Signed)
Left message on machine to call back  

## 2022-09-19 NOTE — Telephone Encounter (Signed)
Patient stated that she will print out and bring paperwork tomorrow for re-enrollment.  She asked if we could send in a prescription so she will not run out into [employer].  Rx sent in.

## 2022-09-20 ENCOUNTER — Ambulatory Visit (HOSPITAL_BASED_OUTPATIENT_CLINIC_OR_DEPARTMENT_OTHER)
Admission: RE | Admit: 2022-09-20 | Discharge: 2022-09-20 | Disposition: A | Discharge status: Home / Self Care | Payer: Medicare HMO | Source: Ambulatory Visit | Attending: Medical | Admitting: Medical

## 2022-09-20 ENCOUNTER — Telehealth: Payer: Self-pay | Admitting: Medical

## 2022-09-20 DIAGNOSIS — E041 Nontoxic single thyroid nodule: Secondary | ICD-10-CM | POA: Diagnosis not present

## 2022-09-20 NOTE — Telephone Encounter (Signed)
PT dropped off paperwork to be filled out by PCP([employer] Application 4pgs.) Pt would like document to be faxed with spouse document, pt spoke with CMA Sheketia. Document put at front office tray under provider name.

## 2022-09-20 NOTE — Telephone Encounter (Signed)
Form given to provider to sign. °

## 2022-09-20 NOTE — Telephone Encounter (Signed)
Forms placed on sheketia's desk

## 2022-09-21 ENCOUNTER — Ambulatory Visit (HOSPITAL_BASED_OUTPATIENT_CLINIC_OR_DEPARTMENT_OTHER): Payer: Medicare HMO

## 2022-09-21 ENCOUNTER — Encounter: Payer: Self-pay | Admitting: Medical

## 2022-09-21 LAB — VITAMIN B1: Vitamin B1 (Thiamine): 11 nmol/L (ref 8–30)

## 2022-10-03 ENCOUNTER — Other Ambulatory Visit: Payer: Self-pay | Admitting: Medical

## 2022-10-04 ENCOUNTER — Ambulatory Visit (INDEPENDENT_AMBULATORY_CARE_PROVIDER_SITE_OTHER): Payer: Medicare HMO

## 2022-10-04 DIAGNOSIS — E538 Deficiency of other specified B group vitamins: Secondary | ICD-10-CM

## 2022-10-04 DIAGNOSIS — G932 Benign intracranial hypertension: Secondary | ICD-10-CM | POA: Diagnosis not present

## 2022-10-04 LAB — HM DIABETES EYE EXAM

## 2022-10-04 MED ORDER — CYANOCOBALAMIN 1000 MCG/ML IJ SOLN
1000.0000 ug | Freq: Once | INTRAMUSCULAR | Status: AC
Start: 2022-10-04 — End: 2022-10-04
  Administered 2022-10-04: 1000 ug via INTRAMUSCULAR

## 2022-10-04 NOTE — Progress Notes (Signed)
Pt here today for 1 of 4 weekly B12 injections.   Cyanocobalamin 1mL injected into L deltoid IM. Pt tolerated injection well.   Next in 1 week.

## 2022-10-05 ENCOUNTER — Encounter: Payer: Self-pay | Admitting: Medical

## 2022-10-08 ENCOUNTER — Other Ambulatory Visit (HOSPITAL_COMMUNITY): Payer: Self-pay

## 2022-10-08 MED ORDER — CYANOCOBALAMIN 1000 MCG/ML IJ SOLN
1000.0000 ug | Freq: Once | INTRAMUSCULAR | 0 refills | Status: AC
  Filled 2022-10-08: qty 3, 21d supply, fill #0

## 2022-10-08 MED ORDER — "LUER LOCK SAFETY SYRINGES 25G X 1"" 3 ML MISC"
1.0000 | 0 refills | Status: AC
  Filled 2022-10-08: qty 10, 70d supply, fill #0

## 2022-10-09 ENCOUNTER — Encounter: Payer: Self-pay | Admitting: Plastic Surgery

## 2022-10-11 ENCOUNTER — Other Ambulatory Visit: Payer: Self-pay | Admitting: Medical

## 2022-10-11 ENCOUNTER — Ambulatory Visit: Payer: Medicare HMO

## 2022-10-12 DIAGNOSIS — R519 Headache, unspecified: Secondary | ICD-10-CM | POA: Diagnosis not present

## 2022-10-12 DIAGNOSIS — G932 Benign intracranial hypertension: Secondary | ICD-10-CM | POA: Diagnosis not present

## 2022-10-14 ENCOUNTER — Other Ambulatory Visit: Payer: Self-pay | Admitting: Medical

## 2022-10-15 ENCOUNTER — Institutional Professional Consult (permissible substitution): Payer: Medicare HMO | Admitting: Plastic Surgery

## 2022-10-18 ENCOUNTER — Encounter: Payer: Self-pay | Admitting: Medical

## 2022-10-18 MED ORDER — LOSARTAN POTASSIUM 100 MG PO TABS: 100.0000 mg | ORAL_TABLET | Freq: Every day | ORAL | 3 refills | Status: DC

## 2022-10-18 NOTE — Addendum Note (Signed)
Addended by: Gwenevere Abbot on: 10/18/2022 10:09 PM   Modules accepted: Orders

## 2022-10-23 ENCOUNTER — Other Ambulatory Visit: Payer: Self-pay | Admitting: Family Medicine

## 2022-10-23 ENCOUNTER — Ambulatory Visit: Payer: Self-pay

## 2022-10-23 DIAGNOSIS — M25552 Pain in left hip: Secondary | ICD-10-CM

## 2022-10-23 DIAGNOSIS — M25532 Pain in left wrist: Secondary | ICD-10-CM

## 2022-10-23 DIAGNOSIS — M25562 Pain in left knee: Secondary | ICD-10-CM

## 2022-10-23 DIAGNOSIS — M79672 Pain in left foot: Secondary | ICD-10-CM

## 2022-11-05 DIAGNOSIS — Z1330 Encounter for screening examination for mental health and behavioral disorders, unspecified: Secondary | ICD-10-CM | POA: Diagnosis not present

## 2022-11-05 DIAGNOSIS — G932 Benign intracranial hypertension: Secondary | ICD-10-CM | POA: Diagnosis not present

## 2022-11-08 DIAGNOSIS — I10 Essential (primary) hypertension: Secondary | ICD-10-CM | POA: Diagnosis not present

## 2022-11-08 DIAGNOSIS — E785 Hyperlipidemia, unspecified: Secondary | ICD-10-CM | POA: Diagnosis not present

## 2022-11-08 DIAGNOSIS — Z6836 Body mass index (BMI) 36.0-36.9, adult: Secondary | ICD-10-CM | POA: Diagnosis not present

## 2022-11-08 DIAGNOSIS — E119 Type 2 diabetes mellitus without complications: Secondary | ICD-10-CM | POA: Diagnosis not present

## 2022-11-08 DIAGNOSIS — E6609 Other obesity due to excess calories: Secondary | ICD-10-CM | POA: Diagnosis not present

## 2022-11-08 DIAGNOSIS — G932 Benign intracranial hypertension: Secondary | ICD-10-CM | POA: Diagnosis not present

## 2022-11-15 ENCOUNTER — Other Ambulatory Visit: Payer: Self-pay | Admitting: Medical

## 2022-11-16 ENCOUNTER — Encounter: Payer: Self-pay | Admitting: Medical

## 2022-11-19 DIAGNOSIS — G932 Benign intracranial hypertension: Secondary | ICD-10-CM | POA: Diagnosis not present

## 2022-11-21 ENCOUNTER — Encounter (INDEPENDENT_AMBULATORY_CARE_PROVIDER_SITE_OTHER): Payer: Self-pay

## 2022-11-23 ENCOUNTER — Ambulatory Visit: Payer: Medicare HMO

## 2022-11-23 DIAGNOSIS — E538 Deficiency of other specified B group vitamins: Secondary | ICD-10-CM | POA: Diagnosis not present

## 2022-11-23 MED ORDER — CYANOCOBALAMIN 1000 MCG/ML IJ SOLN
1000.0000 ug | Freq: Once | INTRAMUSCULAR | Status: AC
Start: 2022-11-23 — End: 2022-11-23
  Administered 2022-11-23: 1000 ug via INTRAMUSCULAR

## 2022-11-23 NOTE — Progress Notes (Signed)
Pt here for monthly B12 injection per Ramon Dredge  B12 given L deltoid IM, and pt tolerated injection well.  Next B12 injection scheduled for 1 month

## 2022-11-25 ENCOUNTER — Other Ambulatory Visit: Payer: Self-pay | Admitting: Medical

## 2022-11-28 ENCOUNTER — Other Ambulatory Visit: Payer: Self-pay | Admitting: Medical

## 2022-11-28 DIAGNOSIS — G932 Benign intracranial hypertension: Secondary | ICD-10-CM | POA: Diagnosis not present

## 2022-12-06 ENCOUNTER — Encounter (INDEPENDENT_AMBULATORY_CARE_PROVIDER_SITE_OTHER): Payer: Self-pay

## 2022-12-09 ENCOUNTER — Encounter: Payer: Self-pay | Admitting: Medical

## 2022-12-10 ENCOUNTER — Other Ambulatory Visit: Payer: Self-pay | Admitting: Medical

## 2022-12-10 DIAGNOSIS — G932 Benign intracranial hypertension: Secondary | ICD-10-CM | POA: Diagnosis not present

## 2022-12-13 ENCOUNTER — Other Ambulatory Visit: Payer: Self-pay | Admitting: Medical

## 2022-12-17 ENCOUNTER — Institutional Professional Consult (permissible substitution): Payer: Medicare HMO | Admitting: Plastic Surgery

## 2022-12-19 ENCOUNTER — Encounter (HOSPITAL_BASED_OUTPATIENT_CLINIC_OR_DEPARTMENT_OTHER): Payer: Self-pay | Admitting: Physician Assistant

## 2022-12-19 ENCOUNTER — Ambulatory Visit (INDEPENDENT_AMBULATORY_CARE_PROVIDER_SITE_OTHER): Payer: Medicare Other | Admitting: Medical

## 2022-12-19 ENCOUNTER — Ambulatory Visit (HOSPITAL_BASED_OUTPATIENT_CLINIC_OR_DEPARTMENT_OTHER)
Admission: RE | Admit: 2022-12-19 | Discharge: 2022-12-19 | Disposition: A | Discharge status: Home / Self Care | Payer: Medicare Other | Source: Ambulatory Visit | Attending: Medical | Admitting: Medical

## 2022-12-19 VITALS — BP 140/74 | HR 80 | Resp 18 | Ht 65.0 in | Wt 216.0 lb

## 2022-12-19 DIAGNOSIS — K219 Gastro-esophageal reflux disease without esophagitis: Secondary | ICD-10-CM

## 2022-12-19 DIAGNOSIS — R059 Cough, unspecified: Secondary | ICD-10-CM | POA: Insufficient documentation

## 2022-12-19 DIAGNOSIS — J040 Acute laryngitis: Secondary | ICD-10-CM | POA: Diagnosis not present

## 2022-12-19 DIAGNOSIS — E538 Deficiency of other specified B group vitamins: Secondary | ICD-10-CM

## 2022-12-19 DIAGNOSIS — G932 Benign intracranial hypertension: Secondary | ICD-10-CM

## 2022-12-19 DIAGNOSIS — R739 Hyperglycemia, unspecified: Secondary | ICD-10-CM

## 2022-12-19 DIAGNOSIS — Z79899 Other long term (current) drug therapy: Secondary | ICD-10-CM

## 2022-12-19 LAB — HEMOGLOBIN A1C: Hgb A1c MFr Bld: 7.2 % — ABNORMAL HIGH (ref 4.6–6.5)

## 2022-12-19 MED ORDER — AZITHROMYCIN 250 MG PO TABS: ORAL_TABLET | ORAL | 0 refills | Status: AC

## 2022-12-19 MED ORDER — BENZONATATE 100 MG PO CAPS: 100.0000 mg | ORAL_CAPSULE | Freq: Three times a day (TID) | ORAL | 0 refills | Status: DC | PRN

## 2022-12-19 MED ORDER — FLUTICASONE PROPIONATE 50 MCG/ACT NA SUSP: 2.0000 | Freq: Every day | NASAL | 1 refills | Status: DC

## 2022-12-19 MED ORDER — FAMOTIDINE 20 MG PO TABS: 20.0000 mg | ORAL_TABLET | Freq: Two times a day (BID) | ORAL | 0 refills | Status: DC

## 2022-12-19 MED ORDER — CYANOCOBALAMIN 1000 MCG/ML IJ SOLN
1000.0000 ug | Freq: Once | INTRAMUSCULAR | Status: AC
Start: 2022-12-19 — End: 2022-12-19
  Administered 2022-12-19: 1000 ug via INTRAMUSCULAR

## 2022-12-19 MED ORDER — ALBUTEROL SULFATE HFA 108 (90 BASE) MCG/ACT IN AERS: 2.0000 | INHALATION_SPRAY | Freq: Four times a day (QID) | RESPIRATORY_TRACT | 3 refills | Status: DC | PRN

## 2022-12-19 NOTE — Patient Instructions (Addendum)
Chronic Cough(possible infection/bronchitis) Persistent for six weeks, associated with voice changes and production of thick phlegm. No associated fever, chills, or sweats. Cough onset after discontinuation of Symbicort, but not resolved with resumption of medication. No current smoking, but history of 10 years of half-pack per day smoking until 2001. -Order chest X-ray. -Prescribe azithromycin for 5 days. -Prescribe benzonatate for symptomatic relief. -Refer to ENT for potential laryngoscopy.  Asthma Patient self-discontinued Symbicort, but has since resumed. Cough not resolved with resumption of medication. -Continue Symbicort as prescribed.  Gastroesophageal Reflux Disease (GERD) Well-controlled on omeprazole, but needs to switch to famotidine due to upcoming surgery and concurrent Plavix use. -Switch omeprazole to famotidine.  Pseudotumor Cerebri Upcoming neurosurgery scheduled in two weeks. Concerns about potential interaction between Plavix and omeprazole per neursurgeon office. -Continue current management plan as directed by neurosurgeon.  Vitamin B12 Deficiency Last injection one month ago, with a level of 150 three months ago. -Administer B12 injection today, as it is the last day it can be given prior to surgery.    Follow up in one month or sooner if needed.

## 2022-12-19 NOTE — Progress Notes (Signed)
Subjective:    Patient ID: Theresa Carter, female    DOB: 1970/06/18, 52 y.o.   MRN: [id number]  HPI  Discussed the use of AI scribe software for clinical note transcription with the patient, who gave verbal consent to proceed.  History of Present Illness   The patient, with a history of asthma and pseudotumor cerebri, presents with a persistent cough of six weeks duration. The cough, which is associated with voice changes and a sensation of strain, is productive of thick, colorless phlegm. The patient denies any associated fevers, chills, or sweats, and reports that the cough is not worse at any particular time of day.  The cough onset coincided with the patient's decision to discontinue Symbicort, an asthma medication. Despite resuming the medication, the cough has persisted for six weeks. The patient denies  heartburn flare symptoms  and reports that her omeprazole has been effective in managing her reflux.  The patient has a history of smoking, having quit in 2001 after approximately 10 years of half a pack per day usage. She also reports a hoarse voice, which she describes as feeling strained.  The patient also has a known B12 deficiency, with a level of 150 three months ago. She has been receiving B12 injections, with the last one administered a little less than a month prior to the current consultation.         Review of Systems  Constitutional:  Negative for chills, fatigue and fever.  HENT:  Negative for congestion and drooling.   Respiratory:  Negative for cough, chest tightness and wheezing.   Cardiovascular:  Negative for chest pain and palpitations.  Gastrointestinal:  Negative for abdominal pain.  Musculoskeletal:  Negative for back pain and joint swelling.  Neurological:  Negative for dizziness, speech difficulty, weakness and light-headedness.  Hematological:  Negative for adenopathy. Does not bruise/bleed easily.  Psychiatric/Behavioral:  Negative for behavioral  problems and decreased concentration.        Objective:   Physical Exam General Mental Status- Alert. General Appearance- Not in acute distress.   Skin General: Color- Normal Color. Moisture- Normal Moisture.  Neck Carotid Arteries- Normal color. Moisture- Normal Moisture. No carotid bruits. No JVD.  Chest and Lung Exam Auscultation: Breath Sounds:-Normal.  Cardiovascular Auscultation:Rythm- Regular. Murmurs & Other Heart Sounds:Auscultation of the heart reveals- No Murmurs.  Abdomen Inspection:-Inspeection Normal. Palpation/Percussion:Note:No mass. Palpation and Percussion of the abdomen reveal- Non Tender, Non Distended + BS, no rebound or guarding.   Neurologic Cranial Nerve exam:- CN III-XII intact(No nystagmus), symmetric smile. Finger to Nose:- Normal/Intact Strength:- 5/5 equal and symmetric strength both upper and lower extremities.        Assessment & Plan:   Assessment and Plan    Chronic Cough(possible infection/bronchitis) Persistent for six weeks, associated with voice changes and production of thick phlegm. No associated fever, chills, or sweats. Cough onset after discontinuation of Symbicort, but not resolved with resumption of medication. No current smoking, but history of 10 years of half-pack per day smoking until 2001. -Order chest X-ray. -Prescribe azithromycin for 5 days. -Prescribe benzonatate for symptomatic relief. -Refer to ENT for potential laryngoscopy.  Asthma Patient self-discontinued Symbicort, but has since resumed. Cough not resolved with resumption of medication. -Continue Symbicort as prescribed.  Gastroesophageal Reflux Disease (GERD) Well-controlled on omeprazole, but needs to switch to famotidine due to upcoming surgery and concurrent Plavix use. -Switch omeprazole to famotidine.  Pseudotumor Cerebri Upcoming neurosurgery scheduled in two weeks. Concerns about potential interaction between Plavix and omeprazole  per  neursurgeon office. -Continue current management plan as directed by neurosurgeon.  Vitamin B12 Deficiency Last injection one month ago, with a level of 150 three months ago. -Administer B12 injection today, as it is the last day it can be given prior to surgery.     Follow up in one month or sooner if needed.        Theresa Richters, PA-C

## 2022-12-19 NOTE — Addendum Note (Signed)
Addended by: Maximino Sarin on: 12/19/2022 01:49 PM   Modules accepted: Orders

## 2022-12-28 ENCOUNTER — Ambulatory Visit: Payer: Medicare HMO

## 2022-12-30 ENCOUNTER — Encounter: Payer: Self-pay | Admitting: Medical

## 2023-01-02 HISTORY — PX: BRAIN SURGERY: SHX531

## 2023-01-03 ENCOUNTER — Telehealth: Payer: Self-pay | Admitting: Medical

## 2023-01-03 NOTE — Telephone Encounter (Signed)
Cindreka from [employer] called and wanted to inform Ramon Dredge that the pt's application is still pending for the benefit verification which will take between 24 to 48 hrs. She mentioned they are still needing the pt to provide clarity for who will be the pt's authorized representative. They are advising the pt to complete the form as it was sent incomplete.

## 2023-01-07 ENCOUNTER — Encounter: Payer: Self-pay | Admitting: *Deleted

## 2023-01-07 ENCOUNTER — Telehealth: Payer: Self-pay | Admitting: *Deleted

## 2023-01-07 NOTE — Transitions of Care (Post Inpatient/ED Visit) (Signed)
01/07/2023  Name: Theresa Carter MRN: [id number] DOB: 1970/12/09  Today's TOC FU Call Status: Today's TOC FU Call Status:: Successful TOC FU Call Completed TOC FU Call Complete Date: 01/07/23 Patient's Name and Date of Birth confirmed.  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  I called the neurosurgeon's office to update them today; I am waiting for them to call me back with the follow up appointment- I don't really think I need to see my PCP until I see the neurosurgeon") Any questions or concerns?: No  Items Reviewed: Did you receive and understand the discharge instructions provided?: Yes (briefly reviewed with patient who verbalizes good understanding of same - outside hospital AVS) Medications obtained,verified, and reconciled?: Partial Review Completed (Partial medication review completed; confirmed patient obtained/ is taking all newly Rx'd medications as instructed; self-manages medications and denies questions/ concerns around medications today: decliunes full medication review during TOC call) Reason for Partial Mediation Review: patient declined- states she is lying down and resting- does not want to review during TOC call Any new allergies since your discharge?: No Dietary orders reviewed?: Yes Type of Diet Ordered:: "Pretty much regular" Do you have support at home?: Yes People in Home: child(ren), adult, spouse Name of Support/Comfort Primary Source: Reports essentially independent in self-care activities post-hospital discharge; prior to surgery was independent;  supportive spouse and adult children assists as/ if needed/ indicated  Medications Reviewed Today: Medications Reviewed Today     Reviewed by Michaela Corner, RN (Registered Nurse) on 01/07/23 at 1432  Med List Status: <None>   Medication Order Taking? Sig Documenting Provider Last Dose Status Informant  acetaminophen (TYLENOL) 500 MG tablet [id number]  Take 1,000 mg by mouth every 6 (six) hours as needed for mild pain. [provider]  Active   Adalimumab (HUMIRA PEN) 40 MG/0.4ML PNKT [id number]  Inject 40 mg into the skin once a week. Tuesday [provider]  Active   albuterol (VENTOLIN HFA) 108 (90 Base) MCG/ACT inhaler [id number]  Inhale 2 puffs into the lungs every 6 (six) hours as needed for wheezing or shortness of breath. Saguier, Ramon Dredge, PA-C  Active   amLODipine (NORVASC) 5 MG tablet [id number]  Take 1 tablet (5 mg total) by mouth daily. Saguier, Ramon Dredge, PA-C  Active   benzonatate (TESSALON) 100 MG capsule [id number]  Take 1 capsule (100 mg total) by mouth 3 (three) times daily as needed for cough. Saguier, Ramon Dredge, PA-C  Active   Blood Glucose Monitoring Suppl [facility] VERIO) w/Device KIT [id number]  1 kit by Does not apply route daily. Check blood glucose once daily as needed Gardenia Phlegm, MD  Active   budesonide-formoterol [facility]) 160-4.5 MCG/ACT inhaler [id number]  INHALE 2 PUFFS BY MOUTH TWICE A DAY Saguier, Kateri Mc  Active   busPIRone (BUSPAR) 15 MG tablet [id number]  Take 1 tablet (15 mg total) by mouth 2 (two) times daily. Saguier, Ramon Dredge, PA-C  Active   cyclobenzaprine (FLEXERIL) 5 MG tablet [id number]  TAKE 1 TABLET BY MOUTH EVERY NIGHT AT BEDTIME AS NEEDED FOR NECK PAIN Saguier, Ramon Dredge, PA-C  Active   dutasteride (AVODART) 0.5 MG capsule [id number]  Take one tablet daily on week days (Monday-Friday). Do  not take if pregnant or trying to get pregnant. [provider]  Active   famotidine (PEPCID) 20 MG tablet [id number]  Take 1 tablet (20 mg total) by  mouth 2 (two) times daily. Saguier, Ramon Dredge, PA-C  Active   fluticasone [facility]) 50 MCG/ACT nasal spray [id number]  USE 1 SPRAY INTO EACH NOSTRILS DAILY Lyndle Herrlich, MD  Active   fluticasone (FLONASE) 50 MCG/ACT nasal spray [id number]  Place 2 sprays into both nostrils daily. Saguier, Ramon Dredge, PA-C  Active   glucose blood (ONETOUCH VERIO) test strip [id number]  Check blood glucose once daily as needed Gardenia Phlegm, MD  Active   Insulin Pen Needle (BD PEN NEEDLE NANO 2ND GEN) 32G X 4 MM MISC [id number]  USE TO INJECT DIABETIC MEDICATION INTO THE SKIN 1 TIME DAILY Lyndle Herrlich, MD  Active   levocetirizine (XYZAL) 5 MG tablet [id number]  Take 1 tablet (5 mg total) by mouth every evening. Saguier, Ramon Dredge, PA-C  Active   losartan (COZAAR) 100 MG tablet [id number]  Take 1 tablet (100 mg total) by mouth daily. Saguier, Ramon Dredge, PA-C  Active   montelukast (SINGULAIR) 10 MG tablet [id number]  Take 1 tablet (10 mg total) by mouth at bedtime. Lyndle Herrlich, MD  Active   mupirocin ointment (BACTROBAN) 2 % [id number]  Apply topically. [provider]  Active   omeprazole (PRILOSEC) 40 MG capsule [id number]  Take 1 capsule (40 mg total) by mouth daily. Saguier, Ramon Dredge, PA-C  Active   OneTouch Delica Lancets 30G MISC [id number]  Check blood sugar once daily as needed Gardenia Phlegm, MD  Active   [facility], 2 MG/DOSE, 8 MG/3ML Namon Cirri [id number]  DIAL AND INJECT UNDER THE SKIN 2 MG WEEKLY Saguier, Kateri Mc  Active   promethazine (PHENERGAN) 25 MG tablet [id number]  Take 1 tablet (25 mg total) by mouth every 8 (eight) hours as needed (headache). Sloan Leiter, DO  Active   rosuvastatin (CRESTOR) 5 MG tablet [id number]  TAKE 1 TABLET BY MOUTH DAILY Saguier, Edward, PA-C  Active   SYRINGE-NEEDLE, DISP, 3 ML (LUER LOCK SAFETY SYRINGES) 25G X 1" 3 ML MISC [id number]  Use as directed with weekly b12 injections Saguier, Kateri Mc  Active   ticagrelor (BRILINTA) 90 MG TABS tablet [id number] Yes  Take 90 mg by mouth 2 (two) times daily. 01/07/23: reports during [facility] call this was started after recent hospitalization at [facility]; discharged 01/03/23 Esperanza Richters, PA-C Taking Active Self           Med Note Michaela Corner   Mon Jan 07, 2023  2:21 PM) 01/07/23: reports during [facility] call this was started after recent hospitalization at [facility]; discharged 01/03/23            Home Care and Equipment/Supplies: Were Home Health Services Ordered?: No Any new equipment or medical supplies ordered?: No  Functional Questionnaire: Do you need assistance with bathing/showering or dressing?: No (adult daughter assisting as indicated after recent surgery) Do you need assistance with meal preparation?: No (adult daughter assisting as indicated after recent surgery) Do you need assistance with eating?: No Do you have difficulty maintaining continence: No Do you need assistance with getting out of bed/getting out of a chair/moving?: No (adult daughter assisting as indicated after recent surgery) Do you have difficulty managing or taking your medications?: No  Follow up appointments reviewed: PCP Follow-up appointment confirmed?: NA (verified not indicated per hospital discharging provider discharge notes) Specialist Hospital Follow-up appointment confirmed?: No Reason Specialist Follow-Up Not Confirmed: Patient  has Specialist Provider Number and will Call for Appointment (confirmed patient has phone number and has contacted neurosurgery provider at [facility]; she reports currently awaiting call  back) Do you need transportation to your follow-up appointment?: No Do you understand care options if your condition(s) worsen?: Yes-patient verbalized understanding  SDOH Interventions Today    Flowsheet Row Most Recent Value  SDOH Interventions   Food Insecurity Interventions Intervention Not Indicated  Transportation Interventions Intervention Not Indicated  [drives self at baseline,  family assisting as  needed post-recent surgery]      TOC Interventions Today    Flowsheet Row Most Recent Value  TOC Interventions   TOC Interventions Discussed/Reviewed TOC Interventions Discussed  [Patient declines need for ongoing/ further care coordination outreach]      Interventions Today    Flowsheet Row Most Recent Value  Chronic Disease   Chronic disease during today's visit Other  [idiopathic intracranial HTN with surgical cranial stent placement]  General Interventions   General Interventions Discussed/Reviewed General Interventions Discussed, Durable Medical Equipment (DME), Doctor Visits  Doctor Visits Discussed/Reviewed PCP, Specialist, Doctor Visits Discussed  Durable Medical Equipment (DME) Dan Humphreys, Other  [reports has cane/ walker at her home for prn use-- confirmed not currently requiring/ using assistive devices]  PCP/Specialist Visits Compliance with follow-up visit  Nutrition Interventions   Nutrition Discussed/Reviewed Nutrition Discussed  Pharmacy Interventions   Pharmacy Dicussed/Reviewed Pharmacy Topics Discussed  Safety Interventions   Safety Discussed/Reviewed Safety Discussed, Fall Risk  [recent surgery]      Caryl Pina, RN, BSN, CCRN Alumnus RN CM Care Coordination/ Transition of Care- [facility] Care Management [phone]: direct office

## 2023-01-09 ENCOUNTER — Other Ambulatory Visit: Payer: Self-pay | Admitting: Medical

## 2023-01-15 ENCOUNTER — Telehealth: Payer: Self-pay

## 2023-01-15 NOTE — Telephone Encounter (Signed)
Pt called and made aware that Ozempic is ready for pick up. Placed in frig.

## 2023-01-19 ENCOUNTER — Other Ambulatory Visit: Payer: Self-pay | Admitting: Medical

## 2023-01-20 ENCOUNTER — Other Ambulatory Visit: Payer: Self-pay | Admitting: Medical

## 2023-01-31 ENCOUNTER — Other Ambulatory Visit: Payer: Self-pay

## 2023-01-31 MED ORDER — BRILINTA 90 MG PO TABS
90.0000 mg | ORAL_TABLET | Freq: Two times a day (BID) | ORAL | 2 refills | Status: DC
Start: 2023-01-31 — End: 2023-04-26
  Filled 2023-01-31: qty 60, 30d supply, fill #0
  Filled 2023-02-26: qty 30, 15d supply, fill #1
  Filled 2023-03-07: qty 60, 30d supply, fill #2
  Filled 2023-03-14: qty 30, 15d supply, fill #2

## 2023-02-07 ENCOUNTER — Institutional Professional Consult (permissible substitution) (INDEPENDENT_AMBULATORY_CARE_PROVIDER_SITE_OTHER): Payer: Medicare HMO

## 2023-02-11 ENCOUNTER — Other Ambulatory Visit: Payer: Self-pay | Admitting: Medical

## 2023-02-18 ENCOUNTER — Institutional Professional Consult (permissible substitution): Payer: Medicare HMO | Admitting: Plastic Surgery

## 2023-02-26 ENCOUNTER — Encounter: Payer: Self-pay | Admitting: Medical

## 2023-02-26 ENCOUNTER — Other Ambulatory Visit: Payer: Self-pay

## 2023-02-27 ENCOUNTER — Other Ambulatory Visit: Payer: Self-pay

## 2023-02-28 MED ORDER — FLUTICASONE PROPIONATE HFA 220 MCG/ACT IN AERO: 2.0000 | INHALATION_SPRAY | Freq: Two times a day (BID) | RESPIRATORY_TRACT | 12 refills | Status: DC

## 2023-02-28 NOTE — Addendum Note (Signed)
Addended by: Gwenevere Abbot on: 02/28/2023 08:20 PM   Modules accepted: Orders

## 2023-02-28 NOTE — Addendum Note (Signed)
Addended by: Gwenevere Abbot on: 02/28/2023 11:41 AM   Modules accepted: Orders

## 2023-03-01 ENCOUNTER — Telehealth: Payer: Self-pay

## 2023-03-01 NOTE — Progress Notes (Signed)
   Care Guide Note  03/01/2023 Name: Ima Lapiana MRN: [id number] DOB: 11/06/1970  Referred by: Esperanza Richters, PA-C Reason for referral : Care Coordination (Outreach to schedule with Pharm d )   Emmalise Renze is a 52 y.o. year old female who is a primary care patient of Saguier, Kateri Mc. Naomia Rigor was referred to the pharmacist for assistance related to DM.    Successful contact was made with the patient to discuss pharmacy services including being ready for the pharmacist to call at least 5 minutes before the scheduled appointment time, to have medication bottles and any blood sugar or blood pressure readings ready for review. The patient agreed to meet with the pharmacist via with the pharmacist via telephone visit on (date/time).  03/05/2023  Penne Lash, RMA Care Guide [address] [facility]  [place], [place] [address] Direct Dial: [phone] [email or web address]

## 2023-03-02 MED ORDER — BUDESONIDE-FORMOTEROL FUMARATE 160-4.5 MCG/ACT IN AERO
2.0000 | INHALATION_SPRAY | Freq: Two times a day (BID) | RESPIRATORY_TRACT | 3 refills | Status: DC
Start: 2023-03-02 — End: 2023-05-13

## 2023-03-02 NOTE — Addendum Note (Signed)
Addended by: Gwenevere Abbot on: 03/02/2023 06:33 AM   Modules accepted: Orders

## 2023-03-05 ENCOUNTER — Other Ambulatory Visit: Payer: Medicare Other | Admitting: Pharmacist

## 2023-03-05 ENCOUNTER — Encounter: Payer: Self-pay | Admitting: Pharmacist

## 2023-03-05 NOTE — Progress Notes (Signed)
03/05/2023 Name: Theresa Carter MRN: [id number] DOB: 19-Jan-1971  Chief Complaint  Patient presents with   Medication Management    Theresa Carter is a 52 y.o. year old female who presented for a telephone visit.   They were referred to the pharmacist by their PCP for assistance in managing medication access.    Subjective: Patient has Medicare Part A and B but does not currently have Part D coverage for medications. [place] get [place] but she was denied. [place] and [place] are requesting a copy of Theresa Carter and a letter stating that Theresa Carter is not eligible for Theresa Carter.   She hopes that she will be able to be added to her husbands insurnace which would start April 24, 2023.   She also reports her other medications are expensive. She is getting Ozempic from medication assistance program thru [employer] and will continue until 04/23/2023 - she reports she has enough Ozempci to last until January 2025.   For generic medications she usually uses [facility] because they will work with her or use GoodRx discount.    Medication Access/Adherence  Current Pharmacy:  [facility] [address] [place] [address] Phone: [phone] Fax: [phone]  [facility] [id number] [place], [place] - [address] [place] [address] Phone: [phone] Fax: [phone]  [id number] [place], [place] - [address] AT [facility] [address] [facility] [address] [place] [address] Phone: [phone] Fax: [phone]   Patient reports affordability concerns with their medications: Yes  Patient reports access/transportation concerns to their pharmacy: No  Patient reports adherence concerns with their medications:  Yes  due to cost    Objective:  Lab Results  Component Value Date   HGBA1C 7.2 (H) 12/19/2022    Lab Results   Component Value Date   CREATININE 0.94 08/24/2022   BUN 16 08/24/2022   NA 138 08/24/2022   K 3.9 08/24/2022   CL 107 08/24/2022   CO2 22 08/24/2022    Lab Results  Component Value Date   CHOL 216 (H) 01/19/2021   HDL 39 (L) 01/19/2021   LDLCALC 123 (H) 01/19/2021   TRIG 305 (H) 01/19/2021   CHOLHDL 5.5 (H) 01/19/2021    Medications Reviewed Today     Reviewed by Henrene Pastor, RPH-CPP (Pharmacist) on 03/05/23 at 1049  Med List Status: <None>   Medication Order Taking? Sig Documenting Provider Last Dose Status Informant  acetaminophen (TYLENOL) 500 MG tablet [id number]  Take 1,000 mg by mouth every 6 (six) hours as needed for mild pain. [provider]  Active   Adalimumab (HUMIRA PEN) 40 MG/0.4ML PNKT [id number]  Inject 40 mg into the skin once a week. Tuesday [provider]  Active            Med Note Clydie Braun, [facility]   Tue Mar 05, 2023 10:44 AM) [facility] Assist [facility]  albuterol (VENTOLIN HFA) 108 [phone] Base) MCG/ACT inhaler [id number] Yes Inhale 2 puffs into the lungs every 6 (six) hours as needed for wheezing or shortness of breath. Saguier, Ramon Dredge, PA-C Taking Active   amLODipine (NORVASC) 5 MG tablet [id number] Yes Take 1 tablet (5 mg total) by mouth daily. Saguier, Ramon Dredge, PA-C Taking Active   benzonatate (TESSALON) 100 MG capsule [id number] Yes Take 1 capsule (100 mg total) by mouth  3 (three) times daily as needed for cough. Saguier, Ramon Dredge, PA-C Taking Active   Blood Glucose Monitoring Suppl [facility] VERIO) w/Device KIT [id number]  1 kit by Does not apply route daily. Check blood glucose once daily as needed Gardenia Phlegm, MD  Active   budesonide-formoterol [facility]) 160-4.5 MCG/ACT inhaler [id number] Yes Inhale 2 puffs into the lungs 2 (two) times daily. Saguier, Ramon Dredge, PA-C Taking Active   busPIRone (BUSPAR) 15 MG tablet [id number] Yes TAKE 1 TABLET BY MOUTH 2 TIMES A DAY Saguier, Ramon Dredge, PA-C Taking Active   cyclobenzaprine (FLEXERIL) 5 MG tablet  [id number] Yes TAKE 1 TABLET BY MOUTH EVERY NIGHT AT BEDTIME AS NEEDED FOR NECK PAIN Saguier, Ramon Dredge, PA-C Taking Active   dutasteride (AVODART) 0.5 MG capsule [id number] Yes Take one tablet daily on week days (Monday-Friday). Do not take if pregnant or trying to get pregnant. [provider] Taking Active   fluticasone (FLONASE) 50 MCG/ACT nasal spray [id number] Yes Place 2 sprays into both nostrils daily. Saguier, Ramon Dredge, PA-C Taking Active   glucose blood (ONETOUCH VERIO) test strip [id number]  Check blood glucose once daily as needed Gardenia Phlegm, MD  Active   Insulin Pen Needle (BD PEN NEEDLE NANO 2ND GEN) 32G X 4 MM MISC [id number]  USE TO INJECT DIABETIC MEDICATION INTO THE SKIN 1 TIME DAILY Lyndle Herrlich, MD  Active   levocetirizine (XYZAL) 5 MG tablet [id number] Yes Take 1 tablet (5 mg total) by mouth every evening. Saguier, Ramon Dredge, PA-C Taking Active   losartan (COZAAR) 100 MG tablet [id number] Yes Take 1 tablet (100 mg total) by mouth daily. Saguier, Ramon Dredge, PA-C Taking Active   montelukast (SINGULAIR) 10 MG tablet [id number] Yes Take 1 tablet (10 mg total) by mouth at bedtime. Lyndle Herrlich, MD Taking Active   mupirocin ointment (BACTROBAN) 2 % [id number] Yes Apply topically. [provider] Taking Active   omeprazole (PRILOSEC) 40 MG capsule [id number] Yes Take 1 capsule (40 mg total) by mouth daily. Saguier, Ramon Dredge, PA-C Taking Active   OneTouch Delica Lancets 30G MISC [id number]  Check blood sugar once daily as needed Gardenia Phlegm, MD  Active   [facility], 2 MG/DOSE, 8 MG/3ML Namon Cirri [id number] Yes DIAL AND INJECT UNDER THE SKIN 2 MG WEEKLY Saguier, Ramon Dredge, PA-C Taking Active   promethazine (PHENERGAN) 25 MG tablet [id number] No Take 1 tablet (25 mg total) by mouth every 8 (eight) hours as needed (headache).  Patient not taking: Reported on 03/05/2023   Sloan Leiter, DO Not Taking Active   rosuvastatin (CRESTOR) 5 MG tablet [id number] Yes TAKE 1 TABLET BY MOUTH  DAILY Saguier, Ramon Dredge, PA-C Taking Active   SYRINGE-NEEDLE, DISP, 3 ML (LUER LOCK SAFETY SYRINGES) 25G X 1" 3 ML MISC [id number]  Use as directed with weekly b12 injections Saguier, Kateri Mc  Active   ticagrelor (BRILINTA) 90 MG TABS tablet [id number] Yes Take 1 tablet (90 mg total) by mouth 2 (two) times daily.  Taking Active               Assessment/Plan:   Medication Management and access: - reviewed medication list and refill history. Discussed adherence - Assisted patient with signing up for Symbicort $35 card - can be used by patients without insurance.  - Created letter for [place] program outlining [place] Carter Carter and provided information that shows Mrs. Chinea is not eligible for 2024. Will have PCP review and sign if agrees and fax to [facility] and Me program - Discussed [facility] low cost  medication list. Sent information to patient thru MyChart. She will compare and use pharmacy with lowest cost.    Follow Up Plan: 1 week to see if [place] app was approved.   Henrene Pastor, PharmD Clinical Pharmacist [facility]  Population Health [phone]

## 2023-03-07 ENCOUNTER — Other Ambulatory Visit: Payer: Self-pay

## 2023-03-12 ENCOUNTER — Encounter: Payer: Self-pay | Admitting: Pharmacist

## 2023-03-12 ENCOUNTER — Other Ambulatory Visit: Payer: Self-pay | Admitting: Pharmacist

## 2023-03-13 ENCOUNTER — Other Ambulatory Visit: Payer: Self-pay | Admitting: Medical

## 2023-03-14 ENCOUNTER — Encounter: Payer: Self-pay | Admitting: Pharmacist

## 2023-03-14 ENCOUNTER — Other Ambulatory Visit: Payer: Self-pay

## 2023-03-18 ENCOUNTER — Other Ambulatory Visit: Payer: Self-pay | Admitting: Medical

## 2023-03-21 NOTE — Telephone Encounter (Signed)
Rx refill flexeril sent to pharmacy.

## 2023-03-27 ENCOUNTER — Encounter: Payer: Self-pay | Admitting: Medical

## 2023-03-27 ENCOUNTER — Other Ambulatory Visit (HOSPITAL_COMMUNITY): Payer: Self-pay

## 2023-03-27 MED ORDER — ROSUVASTATIN CALCIUM 5 MG PO TABS
5.0000 mg | ORAL_TABLET | Freq: Every day | ORAL | 1 refills | Status: DC
  Filled 2023-03-27: qty 90, 90d supply, fill #0

## 2023-03-27 MED ORDER — ROSUVASTATIN CALCIUM 5 MG PO TABS: 5.0000 mg | ORAL_TABLET | Freq: Every day | ORAL | 1 refills | Status: DC

## 2023-03-27 NOTE — Addendum Note (Signed)
Addended by: Maximino Sarin on: 03/27/2023 11:51 AM   Modules accepted: Orders

## 2023-03-28 ENCOUNTER — Encounter: Payer: Self-pay | Admitting: Medical

## 2023-04-02 ENCOUNTER — Encounter: Payer: Self-pay | Admitting: Pharmacist

## 2023-04-02 NOTE — Progress Notes (Signed)
04/02/2023 Name: Theresa Carter MRN: [id number] DOB: 02-11-1971  Chief Complaint  Patient presents with   Medication Management    Theresa Carter is a 52 y.o. year old female who presented for a telephone visit.   They were referred to the pharmacist by their PCP for assistance in managing medication access.    Subjective: Patient has Medicare Part A and B but does not currently have Part D coverage for medications. She will be enrolled in a Medicare part D plan in January 2025.   She [place] Program to get St[place]ards but was denied.  We tried to appeal decision but she appeal was also denied.  Provided coupon card for her to use to get Astra Zeneca inhaler / Breztri at $35 since she currently has no insurnace at our last visit (see below for card info)  In 2024 she was enrolled in [employer] medication assistance program to received Ozempic. Provided patient with link to application for 2025 at our last interactions.   She endorses that she has enough Ozempic to last until January 2025.   For generic medications she usually uses [facility] because they will work with her or use GoodRx discount. She also has been provided with [facility] medication cost to compare to current pharmacy costs.    Medication Access/Adherence  Current Pharmacy:  [facility] [address] [place] [address] Phone: [phone] Fax: [phone]  [facility] [id number] [place], [place] - [address] [place] [address] Phone: [phone] Fax: [phone]  [facility][id number] [place], [place] - [address] AT [facility] [address] [facility] [address] [place] [address] Phone: [phone] Fax: [phone]   Patient reports affordability concerns with their medications: Yes  Patient reports access/transportation concerns to their pharmacy: No  Patient reports  adherence concerns with their medications:  Yes  due to cost    Objective:  Lab Results  Component Value Date   HGBA1C 7.2 (H) 12/19/2022    Lab Results  Component Value Date   CREATININE 0.94 08/24/2022   BUN 16 08/24/2022   NA 138 08/24/2022   K 3.9 08/24/2022   CL 107 08/24/2022   CO2 22 08/24/2022    Lab Results  Component Value Date   CHOL 216 (H) 01/19/2021   HDL 39 (L) 01/19/2021   LDLCALC 123 (H) 01/19/2021   TRIG 305 (H) 01/19/2021   CHOLHDL 5.5 (H) 01/19/2021    Medications Reviewed Today     Reviewed by Henrene Pastor, RPH-CPP (Pharmacist) on 04/02/23 at 0831  Med List Status: <None>   Medication Order Taking? Sig Documenting Provider Last Dose Status Informant  acetaminophen (TYLENOL) 500 MG tablet [id number] No Take 1,000 mg by mouth every 6 (six) hours as needed for mild pain. [provider] Taking Active   Adalimumab (HUMIRA PEN) 40 MG/0.4ML PNKT [id number] No Inject 40 mg into the skin once a week. Tuesday [provider] Taking Active            Med Note Clydie Braun, [place] B   Tue Mar 05, 2023 10:44 AM) [facility]  albuterol (VENTOLIN HFA) 108 (90 Base) MCG/ACT inhaler [id number] No Inhale 2 puffs into the lungs every 6 (six) hours as needed for wheezing or shortness of breath. Saguier, Ramon Dredge, PA-C Taking Active   amLODipine (NORVASC) 5 MG tablet [id number] No  Take 1 tablet (5 mg total) by mouth daily. Saguier, Ramon Dredge, PA-C Taking Active   benzonatate (TESSALON) 100 MG capsule [id number] No Take 1 capsule (100 mg total) by mouth 3 (three) times daily as needed for cough. Saguier, Ramon Dredge, PA-C Taking Active   Blood Glucose Monitoring Suppl (ONETOUCH VERIO) w/Device KIT [id number] No 1 kit by Does not apply route daily. Check blood glucose once daily as needed Gardenia Phlegm, MD Taking Active   budesonide-formoterol [facility]) 160-4.5 MCG/ACT inhaler [id number] No Inhale 2 puffs into the lungs 2 (two) times daily. Saguier, Ramon Dredge,  PA-C Taking Active   busPIRone (BUSPAR) 15 MG tablet [id number]  TAKE 1 TABLET BY MOUTH 2 TIMES A DAY Olive Bass, FNP  Active   cyclobenzaprine (FLEXERIL) 5 MG tablet [id number]  TAKE 1 TABLET BY MOUTH EVERY NIGHT AT BEDTIME AS NEEDED FOR NECK PAIN Saguier, Ramon Dredge, PA-C  Active   dutasteride (AVODART) 0.5 MG capsule [id number] No Take one tablet daily on week days (Monday-Friday). Do not take if pregnant or trying to get pregnant. [provider] Taking Active   fluticasone (FLONASE) 50 MCG/ACT nasal spray [id number] No Place 2 sprays into both nostrils daily. Saguier, Ramon Dredge, PA-C Taking Active   glucose blood (ONETOUCH VERIO) test strip [id number] No Check blood glucose once daily as needed Gardenia Phlegm, MD Taking Active   Insulin Pen Needle (BD PEN NEEDLE NANO 2ND GEN) 32G X 4 MM MISC [id number] No USE TO INJECT DIABETIC MEDICATION INTO THE SKIN 1 TIME DAILY Lyndle Herrlich, MD Taking Active   levocetirizine (XYZAL) 5 MG tablet [id number] No Take 1 tablet (5 mg total) by mouth every evening. Saguier, Ramon Dredge, PA-C Taking Active   losartan (COZAAR) 100 MG tablet [id number] No Take 1 tablet (100 mg total) by mouth daily. Saguier, Ramon Dredge, PA-C Taking Active   montelukast (SINGULAIR) 10 MG tablet [id number] No Take 1 tablet (10 mg total) by mouth at bedtime. Lyndle Herrlich, MD Taking Active   mupirocin ointment (BACTROBAN) 2 % [id number] No Apply topically. [provider] Taking Active   omeprazole (PRILOSEC) 40 MG capsule [id number] No Take 1 capsule (40 mg total) by mouth daily. Saguier, Ramon Dredge, PA-C Taking Active   OneTouch Delica Lancets 30G MISC [id number] No Check blood sugar once daily as needed Gardenia Phlegm, MD Taking Active   OZEMPIC, 2 MG/DOSE, 8 MG/3ML Namon Cirri [id number] No DIAL AND INJECT UNDER THE SKIN 2 MG WEEKLY Saguier, Ramon Dredge, PA-C Taking Active            Med Note Clydie Braun, Tikisha Molinaro B   Tue Mar 05, 2023 10:50 AM) Getting thru [employer] 04/23/2023   promethazine (PHENERGAN) 25 MG tablet [id number] No Take 1 tablet (25 mg total) by mouth every 8 (eight) hours as needed (headache).  Patient not taking: Reported on 03/05/2023   Sloan Leiter, DO Not Taking Active   rosuvastatin (CRESTOR) 5 MG tablet [id number]  Take 1 tablet (5 mg total) by mouth daily. Saguier, Ramon Dredge, PA-C  Active   SYRINGE-NEEDLE, DISP, 3 ML (LUER LOCK SAFETY SYRINGES) 25G X 1" 3 ML MISC [id number]  Use as directed with weekly b12 injections Saguier, Kateri Mc  Active   ticagrelor (BRILINTA) 90 MG TABS tablet [id number] No Take 1 tablet (90 mg total) by mouth 2 (two) times daily.  Taking Active               Assessment/Plan:   Medication Management and access: - reviewed medication list and refill history.  - Completed provider  portion of [employer] 2025 application. Forwarding to PCP to review and sign.  Follow Up Plan: 1 month to F/U [employer] application.   Henrene Pastor, PharmD Clinical Pharmacist [facility]  Population Health [phone]

## 2023-04-12 ENCOUNTER — Encounter: Payer: Self-pay | Admitting: Pharmacist

## 2023-04-12 NOTE — Addendum Note (Signed)
Addended by: Gwenevere Abbot on: 04/12/2023 12:49 PM   Modules accepted: Orders

## 2023-04-14 NOTE — Telephone Encounter (Signed)
I signed patient assistance packet filled out by Tammy. Will you fax it and save in case we need in future.

## 2023-04-15 NOTE — Telephone Encounter (Signed)
Form faxed and placed in my drawer

## 2023-04-16 ENCOUNTER — Other Ambulatory Visit: Payer: Self-pay

## 2023-04-16 ENCOUNTER — Emergency Department (HOSPITAL_BASED_OUTPATIENT_CLINIC_OR_DEPARTMENT_OTHER): Payer: Medicare HMO

## 2023-04-16 ENCOUNTER — Encounter (HOSPITAL_BASED_OUTPATIENT_CLINIC_OR_DEPARTMENT_OTHER): Payer: Self-pay | Admitting: Emergency Medicine

## 2023-04-16 ENCOUNTER — Emergency Department (HOSPITAL_BASED_OUTPATIENT_CLINIC_OR_DEPARTMENT_OTHER)
Admission: EM | Admit: 2023-04-16 | Discharge: 2023-04-16 | Disposition: A | Discharge status: Home / Self Care | Payer: Medicare HMO | Attending: Emergency Medicine | Admitting: Emergency Medicine

## 2023-04-16 DIAGNOSIS — Z79899 Other long term (current) drug therapy: Secondary | ICD-10-CM | POA: Diagnosis not present

## 2023-04-16 DIAGNOSIS — R42 Dizziness and giddiness: Secondary | ICD-10-CM | POA: Diagnosis present

## 2023-04-16 DIAGNOSIS — E119 Type 2 diabetes mellitus without complications: Secondary | ICD-10-CM | POA: Diagnosis not present

## 2023-04-16 DIAGNOSIS — R519 Headache, unspecified: Secondary | ICD-10-CM | POA: Diagnosis not present

## 2023-04-16 DIAGNOSIS — I10 Essential (primary) hypertension: Secondary | ICD-10-CM | POA: Diagnosis not present

## 2023-04-16 DIAGNOSIS — R531 Weakness: Secondary | ICD-10-CM | POA: Insufficient documentation

## 2023-04-16 DIAGNOSIS — G932 Benign intracranial hypertension: Secondary | ICD-10-CM | POA: Diagnosis not present

## 2023-04-16 DIAGNOSIS — E042 Nontoxic multinodular goiter: Secondary | ICD-10-CM | POA: Diagnosis not present

## 2023-04-16 LAB — CBC WITH DIFFERENTIAL/PLATELET
Abs Immature Granulocytes: 0.02 10*3/uL (ref 0.00–0.07)
Basophils Absolute: 0 10*3/uL (ref 0.0–0.1)
Basophils Relative: 0 %
Eosinophils Absolute: 0.1 10*3/uL (ref 0.0–0.5)
Eosinophils Relative: 1 %
HCT: 36.7 % (ref 36.0–46.0)
Hemoglobin: 11.7 g/dL — ABNORMAL LOW (ref 12.0–15.0)
Immature Granulocytes: 0 %
Lymphocytes Relative: 33 %
Lymphs Abs: 2.6 10*3/uL (ref 0.7–4.0)
MCH: 30.2 pg (ref 26.0–34.0)
MCHC: 31.9 g/dL (ref 30.0–36.0)
MCV: 94.8 fL (ref 80.0–100.0)
Monocytes Absolute: 0.5 10*3/uL (ref 0.1–1.0)
Monocytes Relative: 6 %
Neutro Abs: 4.7 10*3/uL (ref 1.7–7.7)
Neutrophils Relative %: 60 %
Platelets: 325 10*3/uL (ref 150–400)
RBC: 3.87 MIL/uL (ref 3.87–5.11)
RDW: 12.3 % (ref 11.5–15.5)
WBC: 7.9 10*3/uL (ref 4.0–10.5)
nRBC: 0 % (ref 0.0–0.2)

## 2023-04-16 LAB — BASIC METABOLIC PANEL
Anion gap: 7 (ref 5–15)
BUN: 11 mg/dL (ref 6–20)
CO2: 29 mmol/L (ref 22–32)
Calcium: 9.4 mg/dL (ref 8.9–10.3)
Chloride: 104 mmol/L (ref 98–111)
Creatinine, Ser: 0.8 mg/dL (ref 0.44–1.00)
GFR, Estimated: >60 mL/min (ref >60)
Glucose, Bld: 148 mg/dL — ABNORMAL HIGH (ref 70–99)
Potassium: 3.7 mmol/L (ref 3.5–5.1)
Sodium: 140 mmol/L (ref 135–145)

## 2023-04-16 LAB — CBG MONITORING, ED: Glucose-Capillary: 114 mg/dL — ABNORMAL HIGH (ref 70–99)

## 2023-04-16 LAB — TROPONIN I (HIGH SENSITIVITY)
Troponin I (High Sensitivity): <2 ng/L (ref <18)
Troponin I (High Sensitivity): 2 ng/L (ref <18)

## 2023-04-16 MED ORDER — LORAZEPAM 2 MG/ML IJ SOLN
1.0000 mg | Freq: Once | INTRAMUSCULAR | Status: AC
  Administered 2023-04-16: 1 mg via INTRAVENOUS
  Filled 2023-04-16: qty 1

## 2023-04-16 MED ORDER — METOCLOPRAMIDE HCL 5 MG/ML IJ SOLN
10.0000 mg | Freq: Once | INTRAMUSCULAR | Status: AC
  Administered 2023-04-16: 10 mg via INTRAVENOUS
  Filled 2023-04-16: qty 2

## 2023-04-16 MED ORDER — IOHEXOL 350 MG/ML SOLN
100.0000 mL | Freq: Once | INTRAVENOUS | Status: AC | PRN
  Administered 2023-04-16: 100 mL via INTRAVENOUS

## 2023-04-16 MED ORDER — LORAZEPAM 1 MG PO TABS: 1.0000 mg | ORAL_TABLET | Freq: Two times a day (BID) | ORAL | 0 refills | Status: DC | PRN

## 2023-04-16 NOTE — ED Provider Notes (Addendum)
[place] EMERGENCY DEPARTMENT AT [facility] Provider Note   CSN: [id number] Arrival date & time: 04/16/23  1603     History  Chief Complaint  Patient presents with   Dizziness    Theresa Carter is a 52 y.o. female.  HPI     52 year old female comes in with chief complaint of dizziness, headaches, feeling unwell.  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  Patient states that since her surgery in September, she has been feeling well.  However about 5 or 6 days ago she started feeling unwell.  Patient would have intermittent episodes of feeling hot, followed by headaches, dizziness, nausea and even chest pain.  Symptoms will last for few minutes and then resolve, but she would still feel weak.  Symptoms were improving over the weekend but then restarted again -and she decided to come to the ER.  Patient is not on any new medications.  She was on Brilinta, which was discontinued earlier this month.   Patient denies any trauma, fevers, chills, URI-like symptoms, abdominal pain.  No history of similar symptoms.  The current symptoms are not similar to her IIH headache.  Home Medications Prior to Admission medications   Medication Sig Start Date End Date Taking? Authorizing Provider  LORazepam (ATIVAN) 1 MG tablet Take 1 tablet (1 mg total) by mouth 2 (two) times daily as needed for anxiety. 04/16/23  Yes Derwood Kaplan, MD  acetaminophen (TYLENOL) 500 MG tablet Take 1,000 mg by mouth every 6 (six) hours as needed for mild pain.    [provider]  Adalimumab (HUMIRA PEN) 40 MG/0.4ML PNKT Inject 40 mg into the skin once a week. Tuesday 09/29/20   [provider]  albuterol (VENTOLIN HFA) 108 (90 Base) MCG/ACT inhaler Inhale 2 puffs into the lungs every 6 (six) hours as needed for wheezing or shortness of breath. 12/19/22   Saguier, Ramon Dredge, PA-C  amLODipine  (NORVASC) 5 MG tablet Take 1 tablet (5 mg total) by mouth daily. 08/14/22 08/14/23  Saguier, Ramon Dredge, PA-C  benzonatate (TESSALON) 100 MG capsule Take 1 capsule (100 mg total) by mouth 3 (three) times daily as needed for cough. 12/19/22   Saguier, Ramon Dredge, PA-C  Blood Glucose Monitoring Suppl (ONETOUCH VERIO) w/Device KIT 1 kit by Does not apply route daily. Check blood glucose once daily as needed 12/21/20   Gardenia Phlegm, MD  budesonide-formoterol [facility]) 160-4.5 MCG/ACT inhaler Inhale 2 puffs into the lungs 2 (two) times daily. 03/02/23   Saguier, Ramon Dredge, PA-C  busPIRone (BUSPAR) 15 MG tablet TAKE 1 TABLET BY MOUTH 2 TIMES A DAY 03/13/23   Olive Bass, FNP  cyclobenzaprine (FLEXERIL) 5 MG tablet TAKE 1 TABLET BY MOUTH EVERY NIGHT AT BEDTIME AS NEEDED FOR NECK PAIN 03/21/23   Saguier, Ramon Dredge, PA-C  dutasteride (AVODART) 0.5 MG capsule Take one tablet daily on week days (Monday-Friday). Do not take if pregnant or trying to get pregnant. 04/26/21   [provider]  fluticasone (FLONASE) 50 MCG/ACT nasal spray Place 2 sprays into both nostrils daily. 12/19/22   Saguier, Ramon Dredge, PA-C  glucose blood [facility] VERIO) test strip Check blood glucose once daily as needed 12/21/20   Gardenia Phlegm, MD  Insulin Pen Needle (BD PEN NEEDLE NANO 2ND GEN) 32G X 4 MM MISC USE TO INJECT DIABETIC MEDICATION INTO THE SKIN 1 TIME DAILY 03/20/22   Lyndle Herrlich, MD  levocetirizine (XYZAL) 5 MG tablet  Take 1 tablet (5 mg total) by mouth every evening. 07/13/22   Saguier, Ramon Dredge, PA-C  losartan (COZAAR) 100 MG tablet Take 1 tablet (100 mg total) by mouth daily. 10/18/22   Saguier, Ramon Dredge, PA-C  montelukast (SINGULAIR) 10 MG tablet Take 1 tablet (10 mg total) by mouth at bedtime. 05/28/22   Lyndle Herrlich, MD  mupirocin ointment (BACTROBAN) 2 % Apply topically. 11/15/21   [provider]  omeprazole (PRILOSEC) 40 MG capsule Take 1 capsule (40 mg total) by mouth daily. 08/28/22   Saguier,  Ramon Dredge, PA-C  OneTouch Delica Lancets 30G MISC Check blood sugar once daily as needed 12/21/20   Gardenia Phlegm, MD  [facility], 2 MG/DOSE, 8 MG/3ML [facility] DIAL AND INJECT UNDER THE SKIN 2 MG WEEKLY 11/29/22   Saguier, Ramon Dredge, PA-C  promethazine (PHENERGAN) 25 MG tablet Take 1 tablet (25 mg total) by mouth every 8 (eight) hours as needed (headache). Patient not taking: Reported on 03/05/2023 08/25/22   Tanda Rockers A, DO  rosuvastatin (CRESTOR) 5 MG tablet Take 1 tablet (5 mg total) by mouth daily. 03/27/23   Saguier, Ramon Dredge, PA-C  SYRINGE-NEEDLE, DISP, 3 ML (LUER LOCK SAFETY SYRINGES) 25G X 1" 3 ML MISC Use as directed with weekly b12 injections 10/08/22   Saguier, Ramon Dredge, PA-C  ticagrelor (BRILINTA) 90 MG TABS tablet Take 1 tablet (90 mg total) by mouth 2 (two) times daily. 01/31/23         Allergies    Patient has no known allergies.    Review of Systems   Review of Systems  All other systems reviewed and are negative.   Physical Exam Updated Vital Signs BP 124/89   Pulse 90   Temp 98 F (36.7 C)   Resp 18   Ht 5\' 6"  (1.676 m)   Wt 96.6 kg   SpO2 96%   BMI 34.38 kg/m  Physical Exam Vitals and nursing note reviewed.  Constitutional:      Appearance: She is well-developed. She is ill-appearing. She is not toxic-appearing.  HENT:     Head: Atraumatic.  Eyes:     Extraocular Movements: Extraocular movements intact.     Pupils: Pupils are equal, round, and reactive to light.  Cardiovascular:     Rate and Rhythm: Normal rate.  Pulmonary:     Effort: Pulmonary effort is normal.  Musculoskeletal:     Cervical back: Normal range of motion and neck supple.  Skin:    General: Skin is warm and dry.  Neurological:     Mental Status: She is alert and oriented to person, place, and time.     Cranial Nerves: No cranial nerve deficit.     Sensory: No sensory deficit.     Motor: No weakness.     Coordination: Coordination normal.     ED Results / Procedures / Treatments   Labs (all  labs ordered are listed, but only abnormal results are displayed) Labs Reviewed  BASIC METABOLIC PANEL - Abnormal; Notable for the following components:      Result Value   Glucose, Bld 148 (*)    All other components within normal limits  CBC WITH DIFFERENTIAL/PLATELET - Abnormal; Notable for the following components:   Hemoglobin 11.7 (*)    All other components within normal limits  CBG MONITORING, ED - Abnormal; Notable for the following components:   Glucose-Capillary 114 (*)    All other components within normal limits  TROPONIN I (HIGH SENSITIVITY)  TROPONIN I (HIGH SENSITIVITY)  EKG EKG Interpretation Date/Time:  Tuesday April 16 2023 16:19:50 EST Ventricular Rate:  100 PR Interval:  173 QRS Duration:  111 QT Interval:  349 QTC Calculation: 451 R Axis:   61  Text Interpretation: Sinus tachycardia No acute changes No significant change since last tracing Nonspecific ST and T wave abnormality q3t3 is not new Confirmed by Derwood Kaplan [phone]) on 04/16/2023 6:20:17 PM  Radiology CT ANGIO HEAD NECK W WO CM Result Date: 04/16/2023 CLINICAL DATA:  Right-sided headache, history of idiopathic intracranial hypertension with stent on the right; recently stopped taking blood thinners EXAM: CT ANGIOGRAPHY HEAD AND NECK CT VENOGRAM TECHNIQUE: Multidetector CT imaging of the head and neck was performed using the standard protocol during bolus administration of intravenous contrast. Multiplanar CT image reconstructions and MIPs were obtained to evaluate the vascular anatomy. Carotid stenosis measurements (when applicable) are obtained utilizing NASCET criteria, using the distal internal carotid diameter as the denominator. Venographic phase images of the brain were obtained following the administration of intravenous contrast. Multiplanar reformats and maximum intensity projections were generated. RADIATION DOSE REDUCTION: This exam was performed according to the departmental  dose-optimization program which includes automated exposure control, adjustment of the mA and/or kV according to patient size and/or use of iterative reconstruction technique. CONTRAST:  OMNIPAQUE IOHEXOL 350 MG/ML SOLN COMPARISON:  08/24/2022 CT head, 08/25/2022 MRI head and venogram FINDINGS: CT HEAD FINDINGS Brain: No evidence of acute infarct, hemorrhage, mass, mass effect, or midline shift. No hydrocephalus or extra-axial fluid collection. Partial empty sella, similar to the prior exam. Normal craniocervical junction. Vascular: No hyperdense vessel. Skull: Normal. Negative for fracture or focal lesion. Sinuses/Orbits: Mucosal thickening in the ethmoid air cells. Mildly tortuous optic nerves. No other acute finding in the orbits. Other: The mastoid air cells are well aerated. CTA NECK FINDINGS Aortic arch: Standard branching. Imaged portion shows no evidence of aneurysm or dissection. No significant stenosis of the major arch vessel origins. Right carotid system: No evidence of dissection, occlusion, or hemodynamically significant stenosis (greater than 50%). Left carotid system: No evidence of dissection, occlusion, or hemodynamically significant stenosis (greater than 50%). Vertebral arteries: No evidence of dissection, occlusion, or hemodynamically significant stenosis (greater than 50%). Skeleton: No acute osseous abnormality. Other neck: Thyroid nodules, which were most recently evaluated on the 09/20/2022 ultrasound. Otherwise negative. Upper chest: No focal pulmonary opacity or pleural effusion. Review of the MIP images confirms the above findings CTA HEAD FINDINGS Anterior circulation: Both internal carotid arteries are patent to the termini, without significant stenosis. A1 segments patent. Normal anterior communicating artery. Anterior cerebral arteries are patent to their distal aspects. No M1 stenosis or occlusion. MCA branches perfused and symmetric. Posterior circulation: Vertebral arteries  patent to the vertebrobasilar junction without stenosis. Basilar patent to its distal aspect. Superior cerebellar arteries patent proximally. Patent P1 segments, hypoplastic on the right. Near fetal origin of the right PCA from the right posterior communicating artery. The left posterior communicating artery is also patent. Evaluation of the PCAs past the P2 segments is limited by venous contamination, but the P1 and P 2 segments do not appear to have significant stenosis. Anatomic variants: None significant. Review of the MIP images confirms the above findings CTV FINDINGS Superior sagittal sinus: Patent. Straight sinus: Patent. Inferior sagittal sinus, vein of Galen and internal cerebral veins: Patent. Transverse sinuses: Interval placement of a stent in the right transverse sinus, which is patent; no filling defect is seen to suggest in stent thrombosis the left transverse sinus is  patent, with narrowing distally, near the transverse-sigmoid junction. Sigmoid sinuses: Patent. Visualized jugular veins: Patent. IMPRESSION: 1. Status post interval placement of a stent in the right transverse sinus, which is patent. 2. Persistent findings suggestive of idiopathic intracranial hypertension, with partial empty sella, tortuous optic nerves, and narrowing of the left distal transverse venous sinus. 3. No acute intracranial process. 4. No intracranial large vessel occlusion or significant stenosis. 5. No hemodynamically significant stenosis in the neck. Electronically Signed   By: Wiliam Ke M.D.   On: 04/16/2023 19:23   CT VENOGRAM HEAD Result Date: 04/16/2023 CLINICAL DATA:  Right-sided headache, history of idiopathic intracranial hypertension with stent on the right; recently stopped taking blood thinners EXAM: CT ANGIOGRAPHY HEAD AND NECK CT VENOGRAM TECHNIQUE: Multidetector CT imaging of the head and neck was performed using the standard protocol during bolus administration of intravenous contrast. Multiplanar  CT image reconstructions and MIPs were obtained to evaluate the vascular anatomy. Carotid stenosis measurements (when applicable) are obtained utilizing NASCET criteria, using the distal internal carotid diameter as the denominator. Venographic phase images of the brain were obtained following the administration of intravenous contrast. Multiplanar reformats and maximum intensity projections were generated. RADIATION DOSE REDUCTION: This exam was performed according to the departmental dose-optimization program which includes automated exposure control, adjustment of the mA and/or kV according to patient size and/or use of iterative reconstruction technique. CONTRAST:  OMNIPAQUE IOHEXOL 350 MG/ML SOLN COMPARISON:  08/24/2022 CT head, 08/25/2022 MRI head and venogram FINDINGS: CT HEAD FINDINGS Brain: No evidence of acute infarct, hemorrhage, mass, mass effect, or midline shift. No hydrocephalus or extra-axial fluid collection. Partial empty sella, similar to the prior exam. Normal craniocervical junction. Vascular: No hyperdense vessel. Skull: Normal. Negative for fracture or focal lesion. Sinuses/Orbits: Mucosal thickening in the ethmoid air cells. Mildly tortuous optic nerves. No other acute finding in the orbits. Other: The mastoid air cells are well aerated. CTA NECK FINDINGS Aortic arch: Standard branching. Imaged portion shows no evidence of aneurysm or dissection. No significant stenosis of the major arch vessel origins. Right carotid system: No evidence of dissection, occlusion, or hemodynamically significant stenosis (greater than 50%). Left carotid system: No evidence of dissection, occlusion, or hemodynamically significant stenosis (greater than 50%). Vertebral arteries: No evidence of dissection, occlusion, or hemodynamically significant stenosis (greater than 50%). Skeleton: No acute osseous abnormality. Other neck: Thyroid nodules, which were most recently evaluated on the 09/20/2022 ultrasound.  Otherwise negative. Upper chest: No focal pulmonary opacity or pleural effusion. Review of the MIP images confirms the above findings CTA HEAD FINDINGS Anterior circulation: Both internal carotid arteries are patent to the termini, without significant stenosis. A1 segments patent. Normal anterior communicating artery. Anterior cerebral arteries are patent to their distal aspects. No M1 stenosis or occlusion. MCA branches perfused and symmetric. Posterior circulation: Vertebral arteries patent to the vertebrobasilar junction without stenosis. Basilar patent to its distal aspect. Superior cerebellar arteries patent proximally. Patent P1 segments, hypoplastic on the right. Near fetal origin of the right PCA from the right posterior communicating artery. The left posterior communicating artery is also patent. Evaluation of the PCAs past the P2 segments is limited by venous contamination, but the P1 and P 2 segments do not appear to have significant stenosis. Anatomic variants: None significant. Review of the MIP images confirms the above findings CTV FINDINGS Superior sagittal sinus: Patent. Straight sinus: Patent. Inferior sagittal sinus, vein of Galen and internal cerebral veins: Patent. Transverse sinuses: Interval placement of a stent in the right  transverse sinus, which is patent; no filling defect is seen to suggest in stent thrombosis the left transverse sinus is patent, with narrowing distally, near the transverse-sigmoid junction. Sigmoid sinuses: Patent. Visualized jugular veins: Patent. IMPRESSION: 1. Status post interval placement of a stent in the right transverse sinus, which is patent. 2. Persistent findings suggestive of idiopathic intracranial hypertension, with partial empty sella, tortuous optic nerves, and narrowing of the left distal transverse venous sinus. 3. No acute intracranial process. 4. No intracranial large vessel occlusion or significant stenosis. 5. No hemodynamically significant stenosis  in the neck. Electronically Signed   By: Wiliam Ke M.D.   On: 04/16/2023 19:23    Procedures Procedures    Medications Ordered in ED Medications  metoCLOPramide (REGLAN) injection 10 mg (10 mg Intravenous Given 04/16/23 1808)  iohexol (OMNIPAQUE) 350 MG/ML injection 100 mL (100 mLs Intravenous Contrast Given 04/16/23 1846)  LORazepam (ATIVAN) injection 1 mg (1 mg Intravenous Given 04/16/23 1924)    ED Course/ Medical Decision Making/ A&P                                 Medical Decision Making Amount and/or Complexity of Data Reviewed Labs: ordered. Radiology: ordered.  Risk Prescription drug management.   This patient presents to the ED with chief complaint(s) of generalized weakness, and multiple other associated symptoms with pertinent past medical history of diabetes, IIH with recent neurosurgical stent placement.patient has no acute neurodeficits.  Generalized weakness, nausea are more persistent, rest of the symptoms are intermittent.  The complaint involves an extensive differential diagnosis and also carries with it a high risk of complications and morbidity.    The differential diagnosis includes : Dural venous thrombosis, stent thrombosis, stent migration, IIH is less likely, but considered in the differential.  Symptoms are not present now for 6 days, which makes meningitis less likely -but that too is considered in the differential.    The initial plan is to get CT scan of the brain, CT venogram, CT angiogram and basic labs.   Additional history obtained: Additional history obtained from spouse Records reviewed Care Everywhere/External Records  Independent labs interpretation:  The following labs were independently interpreted: CBC, BMP are reassuring.  Independent visualization and interpretation of imaging: - I independently visualized the following imaging with scope of interpretation limited to determining acute life threatening conditions related to  emergency care: CT scan angiogram, which revealed no evidence of brain bleed.  Per radiologist, the stent is patent.  There is some narrowing post stent.  There is also findings consistent with IIH.  Treatment and Reassessment: Patient reassessed after the CT scan.  Results discussed with her.  She received Ativan and Reglan, and has felt better.  She was sleeping during my reassessment.  Consultation: - Consulted or discussed management/test interpretation with external professional: Spoke with Dr. De-Vitt -neurosurgery.  I shared patient's wake symptoms with her and also discussed the CT findings including slight narrowing post stent, but patent stent on the CT and findings of IIH.  She recommended close follow-up with neurosurgery next week.  This recommendation was discussed with the patient.  She is comfortable with the plan.  Patient and family is requesting something for anxiety, as the Ativan did help her and she has not had any repeat spells of headaches.  I will give her a few Ativan tablets, but advised that until neurosurgery clears her, we do not feel comfortable calling  her symptoms anxiety related.  The patient appears reasonably screened and/or stabilized for discharge and I doubt any other medical condition or other [place] requiring further screening, evaluation, or treatment in the ED at this time prior to discharge.   Results from the ER workup discussed with the patient face to face and all questions answered to the best of my ability. The patient is safe for discharge with strict return precautions.   Final Clinical Impression(s) / ED Diagnoses Final diagnoses:  IIH (idiopathic intracranial hypertension)  Generalized weakness    Rx / DC Orders ED Discharge Orders          Ordered    LORazepam (ATIVAN) 1 MG tablet  2 times daily PRN        04/16/23 2205              Derwood Kaplan, MD 04/16/23 2204    Derwood Kaplan, MD 04/16/23 2206

## 2023-04-16 NOTE — ED Triage Notes (Signed)
C/o dizziness, nausea, CP and headache since Saturday. Had intracranial hypertension and had stent placed in September. Recently stopped taking blood thinners. States she feels the same as she did before the stent was placed.

## 2023-04-16 NOTE — Discharge Instructions (Signed)
Please return to the ER if the headache gets severe and is not improving, you have associated new one sided numbness, tingling, weakness or confusion, seizures, poor balance or poor vision/vision change.  Please also return to the ER if you start having fevers, near fainting or fainting.  Please call neurosurgery team for a follow-up next week.

## 2023-04-18 ENCOUNTER — Telehealth: Payer: Self-pay

## 2023-04-18 ENCOUNTER — Other Ambulatory Visit (INDEPENDENT_AMBULATORY_CARE_PROVIDER_SITE_OTHER): Payer: Medicare HMO

## 2023-04-18 DIAGNOSIS — E538 Deficiency of other specified B group vitamins: Secondary | ICD-10-CM

## 2023-04-18 DIAGNOSIS — I10 Essential (primary) hypertension: Secondary | ICD-10-CM | POA: Diagnosis not present

## 2023-04-18 DIAGNOSIS — E119 Type 2 diabetes mellitus without complications: Secondary | ICD-10-CM | POA: Diagnosis not present

## 2023-04-18 LAB — COMPREHENSIVE METABOLIC PANEL
ALT: 20 U/L (ref 0–35)
AST: 13 U/L (ref 0–37)
Albumin: 4.2 g/dL (ref 3.5–5.2)
Alkaline Phosphatase: 76 U/L (ref 39–117)
BUN: 16 mg/dL (ref 6–23)
CO2: 29 meq/L (ref 19–32)
Calcium: 8.9 mg/dL (ref 8.4–10.5)
Chloride: 104 meq/L (ref 96–112)
Creatinine, Ser: 0.81 mg/dL (ref 0.40–1.20)
GFR: 84.15 mL/min (ref >60.00)
Glucose, Bld: 127 mg/dL — ABNORMAL HIGH (ref 70–99)
Potassium: 3.7 meq/L (ref 3.5–5.1)
Sodium: 142 meq/L (ref 135–145)
Total Bilirubin: 0.7 mg/dL (ref 0.2–1.2)
Total Protein: 6.8 g/dL (ref 6.0–8.3)

## 2023-04-18 LAB — VITAMIN B12: Vitamin B-12: 265 pg/mL (ref 211–911)

## 2023-04-18 NOTE — Telephone Encounter (Signed)
Patient came in today for labs and also picked up medication that was in fridge. Ozempic.

## 2023-04-24 ENCOUNTER — Other Ambulatory Visit: Payer: Self-pay | Admitting: Medical

## 2023-04-25 ENCOUNTER — Encounter: Payer: Self-pay | Admitting: Pharmacist

## 2023-04-26 ENCOUNTER — Other Ambulatory Visit: Payer: Self-pay | Admitting: *Deleted

## 2023-04-26 ENCOUNTER — Other Ambulatory Visit: Payer: Self-pay | Admitting: Pharmacist

## 2023-04-26 ENCOUNTER — Other Ambulatory Visit: Payer: Self-pay

## 2023-04-26 ENCOUNTER — Encounter: Payer: Self-pay | Admitting: Medical

## 2023-04-26 ENCOUNTER — Other Ambulatory Visit (HOSPITAL_COMMUNITY): Payer: Self-pay

## 2023-04-26 DIAGNOSIS — E119 Type 2 diabetes mellitus without complications: Secondary | ICD-10-CM

## 2023-04-26 MED ORDER — BUSPIRONE HCL 15 MG PO TABS
15.0000 mg | ORAL_TABLET | Freq: Two times a day (BID) | ORAL | 0 refills | Status: DC
  Filled 2023-04-26: qty 60, 30d supply, fill #0

## 2023-04-26 MED ORDER — BUSPIRONE HCL 15 MG PO TABS: 15.0000 mg | ORAL_TABLET | Freq: Two times a day (BID) | ORAL | 0 refills | Status: DC

## 2023-04-26 NOTE — Telephone Encounter (Signed)
 Is she starting b12 injections in 4 months or just a regular office visit follow up in 4 months

## 2023-04-26 NOTE — Progress Notes (Signed)
 04/26/2023 Name: Theresa Carter MRN: [id number] DOB: [birth year]  Chief Complaint  Patient presents with   Medication Management    Theresa Carter is a 53 y.o. year old female. Coordinated with medication assistance program with [employer].    They were referred to the pharmacist by their PCP for assistance in managing medication access.    Subjective: She is now enrolled in a Medicare part D plan as of January 2025.   Brillenta has been discontinued. Patient was having difficulty with cost in 2024. PHysicians with [facility] that placed shunt stopped Billinta.   In 2024 she was enrolled in [employer] medication assistance program to received Ozempic . Verified today that she has been approved thru 12/2023 per [employer] medication assistance program system. Refill is currently in process.     For generic medications she usually uses [facility] because they will work with her or use GoodRx discount. She also has been provided with [facility] medication cost to compare to current pharmacy costs.    Medication Access/Adherence  Current Pharmacy:  DARRYLE LAW - [facility] [address] [place] [address] Phone: [phone] Fax: [phone]  [facility] [id number] [place], [place] - [address] [place] [address] Phone: [phone] Fax: [phone]  [id number] [place], [place] - [address] AT [facility] [address] [facility] [address] [place] [address] Phone: [phone] Fax: [phone]   Patient reports affordability concerns with their medications: Yes  Patient reports access/transportation concerns to their pharmacy: No  Patient reports adherence concerns with their medications:  Yes  due to cost    Objective:  Lab Results  Component Value Date   HGBA1C 7.2 (H) 12/19/2022    Lab Results  Component Value Date   CREATININE 0.81 04/18/2023   BUN 16  04/18/2023   NA 142 04/18/2023   K 3.7 04/18/2023   CL 104 04/18/2023   CO2 29 04/18/2023    Lab Results  Component Value Date   CHOL 216 (H) 01/19/2021   HDL 39 (L) 01/19/2021   LDLCALC 123 (H) 01/19/2021   TRIG 305 (H) 01/19/2021   CHOLHDL 5.5 (H) 01/19/2021    Medications Reviewed Today     Reviewed by Carla Milling, RPH-CPP (Pharmacist) on 04/26/23 at 1323  Med List Status: <None>   Medication Order Taking? Sig Documenting Provider Last Dose Status Informant  acetaminophen  (TYLENOL ) 500 MG tablet [id number] No Take 1,000 mg by mouth every 6 (six) hours as needed for mild pain. [provider] Taking Active   Adalimumab (HUMIRA PEN) 40 MG/0.4ML PNKT [id number] No Inject 40 mg into the skin once a week. Tuesday [provider] Taking Active            Med Note JUSTINO, MILLING NOVAK   Tue Mar 05, 2023 10:44 AM) [facility]  albuterol  (VENTOLIN  HFA) 108 [phone] Base) MCG/ACT inhaler [id number] No Inhale 2 puffs into the lungs every 6 (six) hours as needed for wheezing or shortness of breath. Saguier, Dallas, PA-C Taking Active   amLODipine  (NORVASC ) 5 MG tablet [id number] No Take 1 tablet (5 mg total) by mouth daily. Saguier, Dallas, PA-C Taking Active   benzonatate  (TESSALON ) 100 MG capsule [id number] No Take 1 capsule (100 mg total) by mouth 3 (three) times daily as needed for cough. Saguier, Dallas, PA-C Taking Active   Blood Glucose Monitoring Suppl (ONETOUCH VERIO) w/Device  KIT [id number] No 1 kit by Does not apply route daily. Check blood glucose once daily as needed Golda Lynwood PARAS, MD Taking Active   budesonide -formoterol  (SYMBICORT ) 160-4.5 MCG/ACT inhaler [id number] No Inhale 2 puffs into the lungs 2 (two) times daily. Saguier, Dallas, PA-C Taking Active   busPIRone  (BUSPAR ) 15 MG tablet [id number]  Take 1 tablet (15 mg total) by mouth 2 (two) times daily. Saguier, Dallas, PA-C  Active   cyclobenzaprine  (FLEXERIL ) 5 MG tablet [id number]  TAKE 1 TABLET BY MOUTH  EVERY NIGHT AT BEDTIME AS NEEDED FOR NECK PAIN Saguier, Dallas, PA-C  Active   dutasteride (AVODART) 0.5 MG capsule [id number] No Take one tablet daily on week days (Monday-Friday). Do not take if pregnant or trying to get pregnant. [provider] Taking Active   fluticasone  (FLONASE ) 50 MCG/ACT nasal spray [id number] No Place 2 sprays into both nostrils daily. Saguier, Dallas, PA-C Taking Active   glucose blood (ONETOUCH VERIO) test strip [id number] No Check blood glucose once daily as needed Golda Lynwood PARAS, MD Taking Active   Insulin  Pen Needle (BD PEN NEEDLE NANO 2ND GEN) 32G X 4 MM MISC [id number] No USE TO INJECT DIABETIC MEDICATION INTO THE SKIN 1 TIME DAILY Sridharan, Sriramkumar, MD Taking Active   levocetirizine (XYZAL ) 5 MG tablet [id number] No Take 1 tablet (5 mg total) by mouth every evening. Saguier, Dallas, PA-C Taking Active   LORazepam  (ATIVAN ) 1 MG tablet [id number]  Take 1 tablet (1 mg total) by mouth 2 (two) times daily as needed for anxiety. Nanavati, Ankit, MD  Active   losartan  (COZAAR ) 100 MG tablet [id number] No Take 1 tablet (100 mg total) by mouth daily. Saguier, Dallas, PA-C Taking Active   montelukast  (SINGULAIR ) 10 MG tablet [id number] No Take 1 tablet (10 mg total) by mouth at bedtime. Sridharan, Sriramkumar, MD Taking Active   mupirocin  ointment (BACTROBAN ) 2 % [id number] No Apply topically. [provider] Taking Active   omeprazole  (PRILOSEC) 40 MG capsule [id number] No Take 1 capsule (40 mg total) by mouth daily. Saguier, Dallas, PA-C Taking Active   OneTouch Delica Lancets 30G MISC [id number] No Check blood sugar once daily as needed Golda Lynwood PARAS, MD Taking Active   OZEMPIC , 2 MG/DOSE, 8 MG/3ML NELMA [id number] No DIAL AND INJECT UNDER THE SKIN 2 MG WEEKLY Saguier, Dallas, PA-C Taking Active            Med Note [facility], Dragon Thrush B   Fri Apr 26, 2023  1:23 PM) Novo Nordisk thru 12/2023  promethazine  (PHENERGAN ) 25 MG tablet [id number] No Take 1 tablet (25 mg total)  by mouth every 8 (eight) hours as needed (headache).  Patient not taking: Reported on 03/05/2023   Elnor Jayson LABOR, DO Not Taking Active   rosuvastatin  (CRESTOR ) 5 MG tablet [id number]  Take 1 tablet (5 mg total) by mouth daily. Saguier, Dallas, PA-C  Active   SYRINGE-NEEDLE, DISP, 3 ML (LUER LOCK SAFETY SYRINGES) 25G X 1 3 ML MISC [id number]  Use as directed with weekly b12 injections Saguier, Edward, PA-C  Active    Discontinued 04/26/23 1323 (Change in therapy)               Assessment/Plan:   Medication Management and access: - reviewed medication list and refill history.  - Contacted [employer] medication assistance program. Patient's next refill is in progress per automated system.   Follow Up Plan: 2 to 3 weeks to F/U Novo Nordisk refill for Ozempic   Madelin Ray, PharmD Clinical Pharmacist [facility]  Population Health [phone]

## 2023-04-29 DIAGNOSIS — G932 Benign intracranial hypertension: Secondary | ICD-10-CM | POA: Diagnosis not present

## 2023-05-12 ENCOUNTER — Encounter: Payer: Self-pay | Admitting: Medical

## 2023-05-13 MED ORDER — BUDESONIDE-FORMOTEROL FUMARATE 160-4.5 MCG/ACT IN AERO: 2.0000 | INHALATION_SPRAY | Freq: Two times a day (BID) | RESPIRATORY_TRACT | 3 refills | Status: DC

## 2023-05-14 DIAGNOSIS — R519 Headache, unspecified: Secondary | ICD-10-CM | POA: Diagnosis not present

## 2023-05-14 DIAGNOSIS — H4710 Unspecified papilledema: Secondary | ICD-10-CM | POA: Diagnosis not present

## 2023-05-14 DIAGNOSIS — G932 Benign intracranial hypertension: Secondary | ICD-10-CM | POA: Diagnosis not present

## 2023-05-16 ENCOUNTER — Telehealth: Payer: Self-pay

## 2023-05-16 MED ORDER — SYMBICORT 160-4.5 MCG/ACT IN AERO: 2.0000 | INHALATION_SPRAY | Freq: Two times a day (BID) | RESPIRATORY_TRACT | 3 refills | Status: DC

## 2023-05-16 NOTE — Telephone Encounter (Signed)
 ERROR

## 2023-05-16 NOTE — Telephone Encounter (Signed)
PA initiated via Covermymeds; KEY: [id number]   This medication or product is on your plan's list of covered drugs. Prior authorization is not required at this time. If your pharmacy has questions regarding the processing of your prescription, please have them call the [facility] pharmacy help desk at [phone]. **Please note: This request was submitted electronically. Formulary lowering, tiering exception, cost reduction and/or pre-benefit determination review (including prospective Medicare hospice reviews) requests cannot be requested using this method of submission. Providers contact us at [phone] for further assistance.

## 2023-05-21 ENCOUNTER — Other Ambulatory Visit: Payer: Self-pay | Admitting: Medical

## 2023-05-24 DIAGNOSIS — G932 Benign intracranial hypertension: Secondary | ICD-10-CM | POA: Diagnosis not present

## 2023-05-28 ENCOUNTER — Encounter: Payer: Self-pay | Admitting: Medical

## 2023-05-28 ENCOUNTER — Other Ambulatory Visit: Payer: Self-pay | Admitting: Medical

## 2023-05-28 DIAGNOSIS — Z1231 Encounter for screening mammogram for malignant neoplasm of breast: Secondary | ICD-10-CM

## 2023-05-29 ENCOUNTER — Other Ambulatory Visit: Payer: Self-pay

## 2023-05-29 ENCOUNTER — Other Ambulatory Visit (HOSPITAL_COMMUNITY): Payer: Self-pay

## 2023-05-29 MED ORDER — MONTELUKAST SODIUM 10 MG PO TABS: 10.0000 mg | ORAL_TABLET | Freq: Every day | ORAL | 1 refills | Status: DC

## 2023-05-29 MED ORDER — MONTELUKAST SODIUM 10 MG PO TABS
10.0000 mg | ORAL_TABLET | Freq: Every day | ORAL | 1 refills | Status: DC
  Filled 2023-05-29: qty 90, 90d supply, fill #0

## 2023-05-31 ENCOUNTER — Ambulatory Visit
Admission: RE | Admit: 2023-05-31 | Discharge: 2023-05-31 | Disposition: A | Discharge status: Home / Self Care | Payer: Medicare Other | Source: Ambulatory Visit | Attending: Medical | Admitting: Medical

## 2023-05-31 DIAGNOSIS — Z1231 Encounter for screening mammogram for malignant neoplasm of breast: Secondary | ICD-10-CM

## 2023-06-03 ENCOUNTER — Ambulatory Visit
Admission: EM | Admit: 2023-06-03 | Discharge: 2023-06-03 | Disposition: A | Discharge status: Home / Self Care | Payer: Medicare Other | Attending: Family Medicine | Admitting: Family Medicine

## 2023-06-03 DIAGNOSIS — B349 Viral infection, unspecified: Secondary | ICD-10-CM | POA: Diagnosis not present

## 2023-06-03 DIAGNOSIS — J4540 Moderate persistent asthma, uncomplicated: Secondary | ICD-10-CM | POA: Diagnosis not present

## 2023-06-03 DIAGNOSIS — J101 Influenza due to other identified influenza virus with other respiratory manifestations: Secondary | ICD-10-CM | POA: Diagnosis not present

## 2023-06-03 LAB — POCT INFLUENZA A/B
Influenza A, POC: POSITIVE — AB
Influenza B, POC: NEGATIVE

## 2023-06-03 MED ORDER — PREDNISONE 20 MG PO TABS: ORAL_TABLET | ORAL | 0 refills | Status: DC

## 2023-06-03 MED ORDER — IPRATROPIUM-ALBUTEROL 0.5-2.5 (3) MG/3ML IN SOLN: 3.0000 mL | RESPIRATORY_TRACT | 0 refills | Status: DC | PRN

## 2023-06-03 MED ORDER — OSELTAMIVIR PHOSPHATE 75 MG PO CAPS: 75.0000 mg | ORAL_CAPSULE | Freq: Two times a day (BID) | ORAL | 0 refills | Status: DC

## 2023-06-03 MED ORDER — PROMETHAZINE-DM 6.25-15 MG/5ML PO SYRP: 5.0000 mL | ORAL_SOLUTION | Freq: Three times a day (TID) | ORAL | 0 refills | Status: DC | PRN

## 2023-06-03 NOTE — Discharge Instructions (Addendum)
 Start Tamiflu  for you influenza infection. For sore throat or cough try using a honey-based tea. Use 3 teaspoons of honey with juice squeezed from half lemon. Place shaved pieces of ginger into 1/2-1 cup of water  and warm over stove top. Then mix the ingredients and repeat every 4 hours as needed. Please take Tylenol  500mg -650mg  every 6 hours for aches and pains, fevers. Hydrate very well with at least 2 liters of water . Eat light meals such as soups to replenish electrolytes and soft fruits, veggies. Start an antihistamine like Zyrtec (10mg  daily) for postnasal drainage, sinus congestion.  You can take this together with prednisone  and albuterol .  Use the cough medications as needed.

## 2023-06-03 NOTE — ED Triage Notes (Signed)
 Pt c/o cough, chest congestion, body aches, weakness started yesterday-denies fever-NAD-steady gait/drove self to clinic

## 2023-06-03 NOTE — ED Provider Notes (Signed)
 [facility]  Note:  This document was prepared using Conservation officer, historic buildings and may include unintentional dictation errors.  MRN: [id number] DOB: 1970/05/17  Subjective:   Theresa Carter is a 53 y.o. female presenting for 1 day history of acute onset fever, body pains, chest congestion, shortness of breath and wheezing.  Patient has asthma, would like a prednisone  course and refill of her nebulized albuterol .  Needs flu testing.  No current facility-administered medications for this encounter.  Current Outpatient Medications:    acetaminophen  (TYLENOL ) 500 MG tablet, Take 1,000 mg by mouth every 6 (six) hours as needed for mild pain., Disp: , Rfl:    Adalimumab (HUMIRA PEN) 40 MG/0.4ML PNKT, Inject 40 mg into the skin once a week. Tuesday, Disp: , Rfl:    albuterol  (VENTOLIN  HFA) 108 (90 Base) MCG/ACT inhaler, Inhale 2 puffs into the lungs every 6 (six) hours as needed for wheezing or shortness of breath., Disp: 8 g, Rfl: 3   amLODipine  (NORVASC ) 5 MG tablet, Take 1 tablet (5 mg total) by mouth daily., Disp: 90 tablet, Rfl: 3   benzonatate  (TESSALON ) 100 MG capsule, Take 1 capsule (100 mg total) by mouth 3 (three) times daily as needed for cough., Disp: 30 capsule, Rfl: 0   Blood Glucose Monitoring Suppl (ONETOUCH VERIO) w/Device KIT, 1 kit by Does not apply route daily. Check blood glucose once daily as needed, Disp: 1 kit, Rfl: 0   busPIRone  (BUSPAR ) 15 MG tablet, TAKE 1 TABLET BY MOUTH 2 TIMES A DAY, Disp: 60 tablet, Rfl: 0   cyclobenzaprine  (FLEXERIL ) 5 MG tablet, TAKE 1 TABLET BY MOUTH EVERY NIGHT AT BEDTIME AS NEEDED FOR NECK PAIN, Disp: 20 tablet, Rfl: 1   dutasteride (AVODART) 0.5 MG capsule, Take one tablet daily on week days (Monday-Friday). Do not take if pregnant or trying to get pregnant., Disp: , Rfl:    fluticasone  (FLONASE ) 50 MCG/ACT nasal spray, Place 2 sprays into both nostrils daily., Disp: 16 g, Rfl: 1   glucose blood (ONETOUCH  VERIO) test strip, Check blood glucose once daily as needed, Disp: 100 each, Rfl: 12   Insulin  Pen Needle (BD PEN NEEDLE NANO 2ND GEN) 32G X 4 MM MISC, USE TO INJECT DIABETIC MEDICATION INTO THE SKIN 1 TIME DAILY, Disp: 100 each, Rfl: 3   levocetirizine (XYZAL ) 5 MG tablet, Take 1 tablet (5 mg total) by mouth every evening., Disp: 90 tablet, Rfl: 3   LORazepam  (ATIVAN ) 1 MG tablet, Take 1 tablet (1 mg total) by mouth 2 (two) times daily as needed for anxiety., Disp: 4 tablet, Rfl: 0   losartan  (COZAAR ) 100 MG tablet, Take 1 tablet (100 mg total) by mouth daily., Disp: 100 tablet, Rfl: 3   montelukast  (SINGULAIR ) 10 MG tablet, Take 1 tablet (10 mg total) by mouth at bedtime., Disp: 90 tablet, Rfl: 1   mupirocin  ointment (BACTROBAN ) 2 %, Apply topically., Disp: , Rfl:    omeprazole  (PRILOSEC) 40 MG capsule, Take 1 capsule (40 mg total) by mouth daily., Disp: 90 capsule, Rfl: 3   OneTouch Delica Lancets 30G MISC, Check blood sugar once daily as needed, Disp: 100 each, Rfl: 12   OZEMPIC , 2 MG/DOSE, 8 MG/3ML SOPN, DIAL AND INJECT UNDER THE SKIN 2 MG WEEKLY, Disp: 3 mL, Rfl: 1   promethazine  (PHENERGAN ) 25 MG tablet, Take 1 tablet (25 mg total) by mouth every 8 (eight) hours as needed (headache). (Patient not taking: Reported on 03/05/2023), Disp: 10 tablet, Rfl: 0  rosuvastatin  (CRESTOR ) 5 MG tablet, Take 1 tablet (5 mg total) by mouth daily., Disp: 90 tablet, Rfl: 1   SYMBICORT  160-4.5 MCG/ACT inhaler, Inhale 2 puffs into the lungs in the morning and at bedtime., Disp: 1 each, Rfl: 3   SYRINGE-NEEDLE, DISP, 3 ML (LUER LOCK SAFETY SYRINGES) 25G X 1" 3 ML MISC, Use as directed with weekly b12 injections, Disp: 10 each, Rfl: 0   No Known Allergies  Past Medical History:  Diagnosis Date   Asthma    Chest pain 07/29/2018   Diabetes mellitus without complication (HCC)    Ectopic pregnancy    Hypertension      Past Surgical History:  Procedure Laterality Date   CHOLECYSTECTOMY     DILATION AND  CURETTAGE OF UTERUS     ENDOMETRIAL ABLATION     HIP ARTHROSCOPY     x2   SALPINGECTOMY     TRIGGER FINGER RELEASE     TUBAL LIGATION     WRIST ARTHROSCOPY      Family History  Problem Relation Age of Onset   Venous thrombosis Mother    Breast cancer Mother    Lung cancer Father    Aneurysm Father        brain   Heart attack Brother    Stroke Brother    Venous thrombosis Son        Has antiphospholipid syndrome, and bechets disease.    Social History   Tobacco Use   Smoking status: Former    Types: Cigarettes   Smokeless tobacco: Never   Tobacco comments:    Quit 2001.  Vaping Use   Vaping status: Never Used  Substance Use Topics   Alcohol use: Yes    Comment: RARE   Drug use: Never    ROS   Objective:   Vitals: BP 111/77 (BP Location: Left Arm)   Pulse 93   Temp 99 F (37.2 C) (Oral)   Resp 20   SpO2 95%   Physical Exam Constitutional:      General: She is not in acute distress.    Appearance: Normal appearance. She is well-developed and normal weight. She is not ill-appearing, toxic-appearing or diaphoretic.  HENT:     Head: Normocephalic and atraumatic.     Right Ear: Tympanic membrane, ear canal and external ear normal. No drainage or tenderness. No middle ear effusion. There is no impacted cerumen. Tympanic membrane is not erythematous or bulging.     Left Ear: Tympanic membrane, ear canal and external ear normal. No drainage or tenderness.  No middle ear effusion. There is no impacted cerumen. Tympanic membrane is not erythematous or bulging.     Nose: Congestion present. No rhinorrhea.     Mouth/Throat:     Mouth: Mucous membranes are moist. No oral lesions.     Pharynx: No pharyngeal swelling, oropharyngeal exudate, posterior oropharyngeal erythema or uvula swelling.     Tonsils: No tonsillar exudate or tonsillar abscesses.  Eyes:     General: No scleral icterus.       Right eye: No discharge.        Left eye: No discharge.     Extraocular  Movements: Extraocular movements intact.     Right eye: Normal extraocular motion.     Left eye: Normal extraocular motion.     Conjunctiva/sclera: Conjunctivae normal.  Cardiovascular:     Rate and Rhythm: Normal rate and regular rhythm.     Heart sounds: Normal heart sounds. No murmur heard.  No friction rub. No gallop.  Pulmonary:     Effort: Pulmonary effort is normal. No respiratory distress.     Breath sounds: No stridor. No wheezing, rhonchi or rales.  Chest:     Chest wall: No tenderness.  Musculoskeletal:     Cervical back: Normal range of motion and neck supple.  Lymphadenopathy:     Cervical: No cervical adenopathy.  Skin:    General: Skin is warm and dry.  Neurological:     General: No focal deficit present.     Mental Status: She is alert and oriented to person, place, and time.  Psychiatric:        Mood and Affect: Mood normal.        Behavior: Behavior normal.     Point-of-care testing positive for flu A.  Assessment and Plan :   PDMP not reviewed this encounter.  1. Influenza A   2. Moderate persistent asthma, uncomplicated    Start Tamiflu  for influenza.  Emphasized need for diabetic friendly diet.  Will provide her with a prescription for prednisone  to help her with her respiratory symptoms in the context of asthma.  Refilled her nebulized albuterol .  Use supportive care otherwise.  Counseled patient on potential for adverse effects with medications prescribed/recommended today, ER and return-to-clinic precautions discussed, patient verbalized understanding.    Adolph Hoop, [place] 06/03/23 [phone]

## 2023-06-04 ENCOUNTER — Encounter: Payer: Self-pay | Admitting: Medical

## 2023-06-06 ENCOUNTER — Encounter: Payer: Self-pay | Admitting: Medical

## 2023-06-07 ENCOUNTER — Ambulatory Visit (HOSPITAL_BASED_OUTPATIENT_CLINIC_OR_DEPARTMENT_OTHER)
Admission: RE | Admit: 2023-06-07 | Discharge: 2023-06-07 | Disposition: A | Discharge status: Home / Self Care | Payer: Medicare Other | Source: Ambulatory Visit | Attending: Medical | Admitting: Medical

## 2023-06-07 ENCOUNTER — Encounter: Payer: Self-pay | Admitting: Medical

## 2023-06-07 ENCOUNTER — Ambulatory Visit: Payer: Medicare Other | Admitting: Medical

## 2023-06-07 VITALS — BP 140/82 | HR 99 | Temp 98.2°F | Resp 18 | Ht 66.0 in | Wt 207.6 lb

## 2023-06-07 DIAGNOSIS — Z7985 Long-term (current) use of injectable non-insulin antidiabetic drugs: Secondary | ICD-10-CM

## 2023-06-07 DIAGNOSIS — R0989 Other specified symptoms and signs involving the circulatory and respiratory systems: Secondary | ICD-10-CM | POA: Insufficient documentation

## 2023-06-07 DIAGNOSIS — J4521 Mild intermittent asthma with (acute) exacerbation: Secondary | ICD-10-CM

## 2023-06-07 DIAGNOSIS — J101 Influenza due to other identified influenza virus with other respiratory manifestations: Secondary | ICD-10-CM

## 2023-06-07 DIAGNOSIS — R059 Cough, unspecified: Secondary | ICD-10-CM | POA: Insufficient documentation

## 2023-06-07 DIAGNOSIS — E119 Type 2 diabetes mellitus without complications: Secondary | ICD-10-CM | POA: Diagnosis not present

## 2023-06-07 DIAGNOSIS — R062 Wheezing: Secondary | ICD-10-CM

## 2023-06-07 LAB — CBC WITH DIFFERENTIAL/PLATELET
Absolute Lymphocytes: 819 {cells}/uL — ABNORMAL LOW (ref 850–3900)
Absolute Monocytes: 122 {cells}/uL — ABNORMAL LOW (ref 200–950)
Basophils Absolute: 0 {cells}/uL (ref 0–200)
Basophils Relative: 0 %
Eosinophils Absolute: 0 {cells}/uL — ABNORMAL LOW (ref 15–500)
Eosinophils Relative: 0 %
HCT: 37.4 % (ref 35.0–45.0)
Hemoglobin: 12.5 g/dL (ref 11.7–15.5)
MCH: 30 pg (ref 27.0–33.0)
MCHC: 33.4 g/dL (ref 32.0–36.0)
MCV: 89.9 fL (ref 80.0–100.0)
MPV: 9 fL (ref 7.5–12.5)
Monocytes Relative: 2.9 %
Neutro Abs: 3259 {cells}/uL (ref 1500–7800)
Neutrophils Relative %: 77.6 %
Platelets: 347 10*3/uL (ref 140–400)
RBC: 4.16 10*6/uL (ref 3.80–5.10)
RDW: 12.6 % (ref 11.0–15.0)
Total Lymphocyte: 19.5 %
WBC: 4.2 10*3/uL (ref 3.8–10.8)

## 2023-06-07 LAB — POC COVID19 BINAXNOW: SARS Coronavirus 2 Ag: NEGATIVE

## 2023-06-07 MED ORDER — PREDNISONE 10 MG (21) PO TBPK: ORAL_TABLET | ORAL | 0 refills | Status: DC

## 2023-06-07 MED ORDER — AZITHROMYCIN 250 MG PO TABS: ORAL_TABLET | ORAL | 0 refills | Status: AC

## 2023-06-07 NOTE — Patient Instructions (Signed)
Influenza A with concern for secondary bacterial infection Persistent cough, wheezing, and chest discomfort despite nebulizer treatments every 4 hours. Concern for secondary bacterial infection due to productive cough after nebulizer treatments. -Order CBC, metabolic panel, and chest x-ray. -Prescribe Azithromycin due to concern for secondary bacterial infection. -Continue Tamiflu as prescribed by urgent care.  Asthma exacerbation Flare-up likely triggered by recent illness. Brief relief after nebulizer treatments, but symptoms return within 1.5-2 hours. -Start a 6-day course of Prednisone. -Continue Symbicort 2 inhalations twice daily. -Continue nebulizer treatments every 4-6 hours. -low sugar diet. Make sure hydrating with sugar free gatorade or propel sugar free. -low sugar diet. -check sugars daily. If sugar increase over 200 let me know. continue ozempic weekly. Continue Promethazine cough syrup.  Diabetes Patient has not been monitoring blood glucose levels since the onset of illness. -Advise patient to check blood glucose daily and notify if levels exceed 200. -Continue Ozempic 2mg  weekly.  Follow-up Patient to return on Wednesday or sooner if needed.

## 2023-06-07 NOTE — Progress Notes (Addendum)
Subjective:    Patient ID: Theresa Carter, female    DOB: Feb 21, 1971, 53 y.o.   MRN: [id number]  HPI  Discussed the use of AI scribe software for clinical note transcription with the patient, who gave verbal consent to proceed.  History of Present Illness   Theresa Carter is a 53 year old female with asthma who presents with persistent cough and breathing difficulties following a recent influenza A diagnosis.  She has been experiencing a severe cough and breathing difficulties since being diagnosed with influenza A on Monday. The cough is severe, keeping her up at night, and is associated with transient chest discomfort and back pain, particularly during coughing and deep breaths. She also experiences wheezing and shortness of breath.  Her asthma, typically well-controlled, has been exacerbated by the current illness. She is on Symbicort, taking two inhalations twice daily, and has completed a course of steroids (20 mg, two tablets daily for five days), with the last dose taken today. She is also using promethazine cough medicine three times a day.  She uses a nebulizer every four hours, which provides temporary relief for about one and a half to two hours. Post-nebulizer treatments, she produces thick yellow mucus. Additionally, she is experiencing diarrhea with all food intake.  She has diabetes and is on Ozempic 2 mg weekly. She has not been monitoring her blood sugars since becoming ill but continues her medication. She maintains hydration with Gatorade, water, and soup.  No smoking history. She was not tested for COVID-19 at urgent care, but influenza B was negative.       Review of Systems  Constitutional:  Positive for fever. Negative for chills and fatigue.  HENT:  Positive for congestion. Negative for ear pain.   Respiratory:  Positive for cough, shortness of breath and wheezing.        But does respond to neb tx briefly.  Cardiovascular:  Negative for chest pain and  palpitations.  Gastrointestinal:  Negative for abdominal pain, constipation, diarrhea, nausea and vomiting.  Genitourinary:  Negative for dysuria.  Musculoskeletal:  Positive for myalgias. Negative for back pain and joint swelling.       Upper thorax.  No leg or calf pain reported.  Skin:  Negative for rash.  Neurological:  Negative for dizziness, weakness and light-headedness.  Hematological:  Negative for adenopathy. Does not bruise/bleed easily.  Psychiatric/Behavioral:  Negative for agitation, confusion and dysphoric mood.     Past Medical History:  Diagnosis Date   Asthma    Chest pain 07/29/2018   Diabetes mellitus without complication (HCC)    Ectopic pregnancy    Hypertension      Social History   Socioeconomic History   Marital status: Married    Spouse name: Not on file   Number of children: Not on file   Years of education: Not on file   Highest education level: Some college, no degree  Occupational History   Not on file  Tobacco Use   Smoking status: Former    Types: Cigarettes   Smokeless tobacco: Never   Tobacco comments:    Quit 2001.  Vaping Use   Vaping status: Never Used  Substance and Sexual Activity   Alcohol use: Yes    Comment: RARE   Drug use: Never   Sexual activity: Not on file  Other Topics Concern   Not on file  Social History Narrative   Not on file   Social Drivers of Health   Financial  Resource Strain: Medium Risk (06/06/2023)   Overall Financial Resource Strain (CARDIA)    Difficulty of Paying Living Expenses: Somewhat hard  Food Insecurity: No Food Insecurity (06/06/2023)   Hunger Vital Sign    Worried About Running Out of Food in the Last Year: Never true    Ran Out of Food in the Last Year: Never true  Transportation Needs: No Transportation Needs (06/06/2023)   PRAPARE - Administrator, Civil Service (Medical): No    Lack of Transportation (Non-Medical): No  Physical Activity: Insufficiently Active (06/06/2023)    Exercise Vital Sign    Days of Exercise per Week: 3 days    Minutes of Exercise per Session: 30 min  Stress: Stress Concern Present (06/06/2023)   [employer] of Occupational Health - Occupational Stress Questionnaire    Feeling of Stress : To some extent  Social Connections: Socially Isolated (06/06/2023)   Social Connection and Isolation Panel [NHANES]    Frequency of Communication with Friends and Family: Once a week    Frequency of Social Gatherings with Friends and Family: Once a week    Attends Religious Services: Never    Database administrator or Organizations: No    Attends Engineer, structural: Not on file    Marital Status: Married  Catering manager Violence: Not At Risk (11/04/2022)   Received from [facility]   HITS    Over the last 12 months how often did your partner physically hurt you?: Never    Over the last 12 months how often did your partner insult you or talk down to you?: Never    Over the last 12 months how often did your partner threaten you with physical harm?: Never    Over the last 12 months how often did your partner scream or curse at you?: Never    Past Surgical History:  Procedure Laterality Date   CHOLECYSTECTOMY     DILATION AND CURETTAGE OF UTERUS     ENDOMETRIAL ABLATION     HIP ARTHROSCOPY     x2   SALPINGECTOMY     TRIGGER FINGER RELEASE     TUBAL LIGATION     WRIST ARTHROSCOPY      Family History  Problem Relation Age of Onset   Venous thrombosis Mother    Breast cancer Mother    Lung cancer Father    Aneurysm Father        brain   Heart attack Brother    Stroke Brother    Venous thrombosis Son        Has antiphospholipid syndrome, and bechets disease.    No Known Allergies  Current Outpatient Medications on File Prior to Visit  Medication Sig Dispense Refill   acetaminophen (TYLENOL) 500 MG tablet Take 1,000 mg by mouth every 6 (six) hours as needed for mild pain.     Adalimumab (HUMIRA PEN) 40 MG/0.4ML PNKT  Inject 40 mg into the skin once a week. Tuesday     albuterol (VENTOLIN HFA) 108 (90 Base) MCG/ACT inhaler Inhale 2 puffs into the lungs every 6 (six) hours as needed for wheezing or shortness of breath. 8 g 3   amLODipine (NORVASC) 5 MG tablet Take 1 tablet (5 mg total) by mouth daily. 90 tablet 3   benzonatate (TESSALON) 100 MG capsule Take 1 capsule (100 mg total) by mouth 3 (three) times daily as needed for cough. 30 capsule 0   Blood Glucose Monitoring Suppl (ONETOUCH VERIO) w/Device KIT  1 kit by Does not apply route daily. Check blood glucose once daily as needed 1 kit 0   busPIRone (BUSPAR) 15 MG tablet TAKE 1 TABLET BY MOUTH 2 TIMES A DAY 60 tablet 0   cyclobenzaprine (FLEXERIL) 5 MG tablet TAKE 1 TABLET BY MOUTH EVERY NIGHT AT BEDTIME AS NEEDED FOR NECK PAIN 20 tablet 1   dutasteride (AVODART) 0.5 MG capsule Take one tablet daily on week days (Monday-Friday). Do not take if pregnant or trying to get pregnant.     fluticasone (FLONASE) 50 MCG/ACT nasal spray Place 2 sprays into both nostrils daily. 16 g 1   glucose blood (ONETOUCH VERIO) test strip Check blood glucose once daily as needed 100 each 12   Insulin Pen Needle (BD PEN NEEDLE NANO 2ND GEN) 32G X 4 MM MISC USE TO INJECT DIABETIC MEDICATION INTO THE SKIN 1 TIME DAILY 100 each 3   ipratropium-albuterol (DUONEB) 0.5-2.5 (3) MG/3ML SOLN Take 3 mLs by nebulization every 4 (four) hours as needed. 75 mL 0   levocetirizine (XYZAL) 5 MG tablet Take 1 tablet (5 mg total) by mouth every evening. 90 tablet 3   LORazepam (ATIVAN) 1 MG tablet Take 1 tablet (1 mg total) by mouth 2 (two) times daily as needed for anxiety. 4 tablet 0   losartan (COZAAR) 100 MG tablet Take 1 tablet (100 mg total) by mouth daily. 100 tablet 3   montelukast (SINGULAIR) 10 MG tablet Take 1 tablet (10 mg total) by mouth at bedtime. 90 tablet 1   mupirocin ointment (BACTROBAN) 2 % Apply topically.     omeprazole (PRILOSEC) 40 MG capsule Take 1 capsule (40 mg total) by  mouth daily. 90 capsule 3   OneTouch Delica Lancets 30G MISC Check blood sugar once daily as needed 100 each 12   oseltamivir (TAMIFLU) 75 MG capsule Take 1 capsule (75 mg total) by mouth 2 (two) times daily. 10 capsule 0   OZEMPIC, 2 MG/DOSE, 8 MG/3ML SOPN DIAL AND INJECT UNDER THE SKIN 2 MG WEEKLY 3 mL 1   predniSONE (DELTASONE) 20 MG tablet Take 2 tablets daily with breakfast. 10 tablet 0   promethazine (PHENERGAN) 25 MG tablet Take 1 tablet (25 mg total) by mouth every 8 (eight) hours as needed (headache). (Patient not taking: Reported on 03/05/2023) 10 tablet 0   promethazine-dextromethorphan (PROMETHAZINE-DM) 6.25-15 MG/5ML syrup Take 5 mLs by mouth 3 (three) times daily as needed for cough. 200 mL 0   rosuvastatin (CRESTOR) 5 MG tablet Take 1 tablet (5 mg total) by mouth daily. 90 tablet 1   SYMBICORT 160-4.5 MCG/ACT inhaler Inhale 2 puffs into the lungs in the morning and at bedtime. 1 each 3   SYRINGE-NEEDLE, DISP, 3 ML (LUER LOCK SAFETY SYRINGES) 25G X 1" 3 ML MISC Use as directed with weekly b12 injections 10 each 0   No current facility-administered medications on file prior to visit.    BP (!) 140/82   Pulse 99   Temp 98.2 F (36.8 C)   Resp 18   Ht 5\' 6"  (1.676 m)   Wt 207 lb 9.6 oz (94.2 kg)   SpO2 99%   BMI 33.51 kg/m        Objective:   Physical Exam  General Mental Status- Alert. General Appearance- Not in acute distress.   Skin General: Color- Normal Color. Moisture- Normal Moisture.  Neck Carotid Arteries- Normal color. Moisture- Normal Moisture. No carotid bruits. No JVD.  Chest and Lung Exam Auscultation: Breath Sounds:-Normal.  Cardiovascular Auscultation:Rythm- Regular. Murmurs & Other Heart Sounds:Auscultation of the heart reveals- No Murmurs.  Abdomen Inspection:-Inspeection Normal. Palpation/Percussion:Note:No mass. Palpation and Percussion of the abdomen reveal- Non Tender, Non Distended + BS, no rebound or  guarding.   Neurologic Cranial Nerve exam:- CN III-XII intact(No nystagmus), symmetric smile. Strength:- 5/5 equal and symmetric strength both upper and lower extremities.   Lower ext- calfs symmetric,  No edema.    Assessment & Plan:   Assessment and Plan    Influenza A with concern for secondary bacterial infection Persistent cough, wheezing, and chest discomfort despite nebulizer treatments every 4 hours. Concern for secondary bacterial infection due to productive cough after nebulizer treatments. -Order CBC, metabolic panel, and chest x-ray. -Prescribe Azithromycin due to concern for secondary bacterial infection. -Continue Tamiflu as prescribed by urgent care.  Asthma exacerbation Flare-up likely triggered by recent illness. Brief relief after nebulizer treatments, but symptoms return within 1.5-2 hours. -Start a 6-day course of Prednisone. -Continue Symbicort 2 inhalations twice daily. -Continue nebulizer treatments every 4-6 hours. -low sugar diet. Make sure hydrating with sugar free gatorade or propel sugar free. -low sugar diet. -check sugars daily. If sugar increase over 200 let me know. continue ozempic weekly. Continue Promethazine cough syrup.  Diabetes Patient has not been monitoring blood glucose levels since the onset of illness. -Advise patient to check blood glucose daily and notify if levels exceed 200. -Continue Ozempic 2mg  weekly.  Follow-up Patient to return on Wednesday or sooner if needed.       Esperanza Richters, PA-C   Time spent with patient today was 42 minutes which consisted of chart revdiew, discussing diagnosis, work up treatment and documentation.

## 2023-06-08 LAB — COMPREHENSIVE METABOLIC PANEL
AG Ratio: 1.5 (calc) (ref 1.0–2.5)
ALT: 75 U/L — ABNORMAL HIGH (ref 6–29)
AST: 49 U/L — ABNORMAL HIGH (ref 10–35)
Albumin: 4.2 g/dL (ref 3.6–5.1)
Alkaline phosphatase (APISO): 70 U/L (ref 37–153)
BUN: 18 mg/dL (ref 7–25)
CO2: 17 mmol/L — ABNORMAL LOW (ref 20–32)
Calcium: 9 mg/dL (ref 8.6–10.4)
Chloride: 111 mmol/L — ABNORMAL HIGH (ref 98–110)
Creat: 0.86 mg/dL (ref 0.50–1.03)
Globulin: 2.8 g/dL (ref 1.9–3.7)
Glucose, Bld: 316 mg/dL — ABNORMAL HIGH (ref 65–99)
Potassium: 3.7 mmol/L (ref 3.5–5.3)
Sodium: 138 mmol/L (ref 135–146)
Total Bilirubin: 0.4 mg/dL (ref 0.2–1.2)
Total Protein: 7 g/dL (ref 6.1–8.1)

## 2023-06-12 ENCOUNTER — Telehealth: Payer: Medicare Other | Admitting: Medical

## 2023-06-12 DIAGNOSIS — R300 Dysuria: Secondary | ICD-10-CM

## 2023-06-12 DIAGNOSIS — J101 Influenza due to other identified influenza virus with other respiratory manifestations: Secondary | ICD-10-CM

## 2023-06-12 DIAGNOSIS — J4540 Moderate persistent asthma, uncomplicated: Secondary | ICD-10-CM

## 2023-06-12 DIAGNOSIS — R059 Cough, unspecified: Secondary | ICD-10-CM | POA: Diagnosis not present

## 2023-06-12 MED ORDER — IPRATROPIUM-ALBUTEROL 0.5-2.5 (3) MG/3ML IN SOLN: 3.0000 mL | RESPIRATORY_TRACT | 0 refills | Status: AC | PRN

## 2023-06-12 MED ORDER — HYDROCOD POLI-CHLORPHE POLI ER 10-8 MG/5ML PO SUER: 5.0000 mL | Freq: Two times a day (BID) | ORAL | 0 refills | Status: DC | PRN

## 2023-06-12 MED ORDER — PREDNISONE 10 MG PO TABS: 10.0000 mg | ORAL_TABLET | Freq: Every day | ORAL | 0 refills | Status: DC

## 2023-06-12 NOTE — Progress Notes (Signed)
Subjective:    Patient ID: Theresa Carter, female    DOB: 09-22-1970, 53 y.o.   MRN: [id number]   Virtual Visit via Video Note  I connected with Mckaela Howley on 06/12/23 at  9:20 AM EST by a video enabled telemedicine application and verified that I am speaking with the correct person using two identifiers.  Location: Patient: home [place] Provider: home [place]   I discussed the limitations of evaluation and management by telemedicine and the availability of in person appointments. The patient expressed understanding and agreed to proceed.      I discussed the assessment and treatment plan with the patient. The patient was provided an opportunity to ask questions and all were answered. The patient agreed with the plan and demonstrated an understanding of the instructions.   The patient was advised to call back or seek an in-person evaluation if the symptoms worsen or if the condition fails to improve as anticipated.   HPI  Discussed the use of AI scribe software for clinical note transcription with the patient, who gave verbal consent to proceed.  History of Present Illness   Theresa Carter is a 53 year old female with asthma who presents for follow-up after influenza and concerns of secondary bacterial infection.  She is following up after a recent influenza infection, during which I was concerned about a secondary bacterial infection. She was prescribed an antibiotic and continued Tamiflu, with today being her last day of the antibiotic. A recent chest X-ray did not show any obvious pneumonia, and her white blood cell count was normal despite being on steroids.  Her asthma has flared due to the recent illness, and she is experiencing difficulty controlling it. When she starts coughing, she has trouble breathing and becomes very winded, especially when walking or climbing stairs. She has been using a tapered prednisone regimen but still feels winded. Today her oxygen  saturation at rest is 97%, which drops to 95% after climbing stairs.  For her cough, she was initially prescribed promethazine three times a day from urgent care, but she feels it is not effective. She continues to have a productive cough with yellow sputum, although it does not come up often. She uses Symbicort twice daily and has been using DuoNeb (albuterol and ipratropium) three times a day, having reduced from every six hours. She is at the end of her prednisone taper, taking 10 mg doses, with one dose remaining for tomorrow.  She notes significant weight loss, weighing 202 pounds this morning, likely due to her limited diet. She ensures she stays hydrated with sugar-free beverages due to her diabetes. She has missed work for two weeks and is eager to return, expressing that her asthma exacerbations are typically not severe unless she is ill.  Her metabolic panel showed elevated blood sugar, which was probably associated to steroid use. She has been primarily consuming soup, tea, and water. No calf pain or swelling in her legs.           Review of Systems  Constitutional:  Positive for fatigue. Negative for chills and fever.  HENT:  Positive for congestion. Negative for ear pain.   Respiratory:  Positive for cough and wheezing. Negative for chest tightness and shortness of breath.   Cardiovascular:  Negative for chest pain and palpitations.  Gastrointestinal:  Negative for abdominal pain and blood in stool.  Genitourinary:  Negative for dyspareunia, enuresis and frequency.  Musculoskeletal:  Negative for back pain and joint swelling.  Skin:  Negative for rash.  Neurological:  Negative for dizziness, syncope, weakness, light-headedness, numbness and headaches.  Hematological:  Negative for adenopathy. Does not bruise/bleed easily.  Psychiatric/Behavioral:  Negative for behavioral problems and decreased concentration. The patient is not nervous/anxious.     Past Medical History:   Diagnosis Date   Asthma    Chest pain 07/29/2018   Diabetes mellitus without complication (HCC)    Ectopic pregnancy    Hypertension      Social History   Socioeconomic History   Marital status: Married    Spouse name: Not on file   Number of children: Not on file   Years of education: Not on file   Highest education level: Some college, no degree  Occupational History   Not on file  Tobacco Use   Smoking status: Former    Types: Cigarettes   Smokeless tobacco: Never   Tobacco comments:    Quit 2001.  Vaping Use   Vaping status: Never Used  Substance and Sexual Activity   Alcohol use: Yes    Comment: RARE   Drug use: Never   Sexual activity: Not on file  Other Topics Concern   Not on file  Social History Narrative   Not on file   Social Drivers of Health   Financial Resource Strain: Medium Risk (06/06/2023)   Overall Financial Resource Strain (CARDIA)    Difficulty of Paying Living Expenses: Somewhat hard  Food Insecurity: No Food Insecurity (06/06/2023)   Hunger Vital Sign    Worried About Running Out of Food in the Last Year: Never true    Ran Out of Food in the Last Year: Never true  Transportation Needs: No Transportation Needs (06/06/2023)   PRAPARE - Administrator, Civil Service (Medical): No    Lack of Transportation (Non-Medical): No  Physical Activity: Insufficiently Active (06/06/2023)   Exercise Vital Sign    Days of Exercise per Week: 3 days    Minutes of Exercise per Session: 30 min  Stress: Stress Concern Present (06/06/2023)   [employer] of Occupational Health - Occupational Stress Questionnaire    Feeling of Stress : To some extent  Social Connections: Socially Isolated (06/06/2023)   Social Connection and Isolation Panel [NHANES]    Frequency of Communication with Friends and Family: Once a week    Frequency of Social Gatherings with Friends and Family: Once a week    Attends Religious Services: Never    Database administrator  or Organizations: No    Attends Engineer, structural: Not on file    Marital Status: Married  Catering manager Violence: Not At Risk (11/04/2022)   Received from [facility]   HITS    Over the last 12 months how often did your partner physically hurt you?: Never    Over the last 12 months how often did your partner insult you or talk down to you?: Never    Over the last 12 months how often did your partner threaten you with physical harm?: Never    Over the last 12 months how often did your partner scream or curse at you?: Never    Past Surgical History:  Procedure Laterality Date   CHOLECYSTECTOMY     DILATION AND CURETTAGE OF UTERUS     ENDOMETRIAL ABLATION     HIP ARTHROSCOPY     x2   SALPINGECTOMY     TRIGGER FINGER RELEASE     TUBAL LIGATION  WRIST ARTHROSCOPY      Family History  Problem Relation Age of Onset   Venous thrombosis Mother    Breast cancer Mother    Lung cancer Father    Aneurysm Father        brain   Heart attack Brother    Stroke Brother    Venous thrombosis Son        Has antiphospholipid syndrome, and bechets disease.    No Known Allergies  Current Outpatient Medications on File Prior to Visit  Medication Sig Dispense Refill   acetaminophen (TYLENOL) 500 MG tablet Take 1,000 mg by mouth every 6 (six) hours as needed for mild pain.     Adalimumab (HUMIRA PEN) 40 MG/0.4ML PNKT Inject 40 mg into the skin once a week. Tuesday     albuterol (VENTOLIN HFA) 108 (90 Base) MCG/ACT inhaler Inhale 2 puffs into the lungs every 6 (six) hours as needed for wheezing or shortness of breath. 8 g 3   amLODipine (NORVASC) 5 MG tablet Take 1 tablet (5 mg total) by mouth daily. 90 tablet 3   azithromycin (ZITHROMAX) 250 MG tablet Take 2 tablets on day 1, then 1 tablet daily on days 2 through 5 6 tablet 0   benzonatate (TESSALON) 100 MG capsule Take 1 capsule (100 mg total) by mouth 3 (three) times daily as needed for cough. 30 capsule 0   Blood Glucose  Monitoring Suppl (ONETOUCH VERIO) w/Device KIT 1 kit by Does not apply route daily. Check blood glucose once daily as needed 1 kit 0   busPIRone (BUSPAR) 15 MG tablet TAKE 1 TABLET BY MOUTH 2 TIMES A DAY 60 tablet 0   cyclobenzaprine (FLEXERIL) 5 MG tablet TAKE 1 TABLET BY MOUTH EVERY NIGHT AT BEDTIME AS NEEDED FOR NECK PAIN 20 tablet 1   dutasteride (AVODART) 0.5 MG capsule Take one tablet daily on week days (Monday-Friday). Do not take if pregnant or trying to get pregnant.     fluticasone (FLONASE) 50 MCG/ACT nasal spray Place 2 sprays into both nostrils daily. 16 g 1   glucose blood (ONETOUCH VERIO) test strip Check blood glucose once daily as needed 100 each 12   Insulin Pen Needle (BD PEN NEEDLE NANO 2ND GEN) 32G X 4 MM MISC USE TO INJECT DIABETIC MEDICATION INTO THE SKIN 1 TIME DAILY 100 each 3   levocetirizine (XYZAL) 5 MG tablet Take 1 tablet (5 mg total) by mouth every evening. 90 tablet 3   LORazepam (ATIVAN) 1 MG tablet Take 1 tablet (1 mg total) by mouth 2 (two) times daily as needed for anxiety. 4 tablet 0   losartan (COZAAR) 100 MG tablet Take 1 tablet (100 mg total) by mouth daily. 100 tablet 3   montelukast (SINGULAIR) 10 MG tablet Take 1 tablet (10 mg total) by mouth at bedtime. 90 tablet 1   mupirocin ointment (BACTROBAN) 2 % Apply topically.     omeprazole (PRILOSEC) 40 MG capsule Take 1 capsule (40 mg total) by mouth daily. 90 capsule 3   OneTouch Delica Lancets 30G MISC Check blood sugar once daily as needed 100 each 12   oseltamivir (TAMIFLU) 75 MG capsule Take 1 capsule (75 mg total) by mouth 2 (two) times daily. 10 capsule 0   OZEMPIC, 2 MG/DOSE, 8 MG/3ML SOPN DIAL AND INJECT UNDER THE SKIN 2 MG WEEKLY 3 mL 1   predniSONE (STERAPRED UNI-PAK 21 TAB) 10 MG (21) TBPK tablet Taper over 6 days. 21 tablet 0   promethazine (PHENERGAN)  25 MG tablet Take 1 tablet (25 mg total) by mouth every 8 (eight) hours as needed (headache). (Patient not taking: Reported on 03/05/2023) 10 tablet  0   rosuvastatin (CRESTOR) 5 MG tablet Take 1 tablet (5 mg total) by mouth daily. 90 tablet 1   SYMBICORT 160-4.5 MCG/ACT inhaler Inhale 2 puffs into the lungs in the morning and at bedtime. 1 each 3   SYRINGE-NEEDLE, DISP, 3 ML (LUER LOCK SAFETY SYRINGES) 25G X 1" 3 ML MISC Use as directed with weekly b12 injections 10 each 0   No current facility-administered medications on file prior to visit.    SpO2 97% Comment: At rest.        Objective:   Physical Exam General-no acute distress, pleasant, oriented. Lungs- on inspection lungs appear unlabored. Neck- no tracheal deviation or jvd on inspection. Neuro- gross motor function appears intact.        Assessment & Plan:   Assessment and Plan    Post-Influenza Asthma Exacerbation Persistent cough and dyspnea despite completion of antibiotics, Tamiflu, and a prednisone tape(now on last day 10 mg tab). No evidence of pneumonia on imaging. Normal white count despite steroid.. -Continue Symbicort twice daily. -Refill ipratropium/albuterol Duoneb, use three times daily. -Extend prednisone for an additional four days at a low dose. -Replace promethazine with Hycodan (hydrocodone-based cough syrup), 5 mL every 12 hours as needed for cough. Rx advisement given. DC promethazine cough syrup. -Check in on 06/14/2023 to assess readiness to return to work.  Diabetes Elevated glucose noted, likely secondary to steroid use and recent illness. Continue current meds -Continue to monitor blood glucose levels, maintain hydration, and adhere to a sugar-free diet.  Return to Work Patient has missed two weeks of work due to illness and is eager to return. -Complete short-term disability paperwork for missed work day.(I don't have that paperwork yet) will fill out current fmla form once update that she is ready to return to work. hoping will be monday -Plan to return to work on 06/17/2023, pending improvement in symptoms. -Consider intermittent leave  (3 events/month, 3 days/event) for future asthma exacerbations.        Esperanza Richters, PA-C

## 2023-06-12 NOTE — Patient Instructions (Signed)
Post-Influenza Asthma Exacerbation Persistent cough and dyspnea despite completion of antibiotics, Tamiflu, and a prednisone tape(now on last day 10 mg tab). No evidence of pneumonia on imaging. Normal white count despite steroid.. -Continue Symbicort twice daily. -Refill ipratropium/albuterol Duoneb, use three times daily. -Extend prednisone for an additional four days at a low dose. -Replace promethazine with Hycodan (hydrocodone-based cough syrup), 5 mL every 12 hours as needed for cough. Rx advisement given. DC promethazine cough syrup. -Check in on 06/14/2023 to assess readiness to return to work.  Diabetes Elevated glucose noted, likely secondary to steroid use and recent illness. Continue current meds -Continue to monitor blood glucose levels, maintain hydration, and adhere to a sugar-free diet.  Return to Work Patient has missed two weeks of work due to illness and is eager to return. -Complete short-term disability paperwork for missed work day.(I don't have that paperwork yet) will fill out current fmla form once update that she is ready to return to work. hoping will be monday -Plan to return to work on 06/17/2023, pending improvement in symptoms. -Consider intermittent leave (3 events/month, 3 days/event) for future asthma exacerbations.

## 2023-06-13 ENCOUNTER — Other Ambulatory Visit: Payer: Self-pay | Admitting: Medical

## 2023-06-14 ENCOUNTER — Encounter: Payer: Self-pay | Admitting: Medical

## 2023-06-14 MED ORDER — BENZONATATE 100 MG PO CAPS
100.0000 mg | ORAL_CAPSULE | Freq: Three times a day (TID) | ORAL | 0 refills | Status: DC | PRN
Start: 2023-06-14 — End: 2023-08-30

## 2023-06-14 NOTE — Addendum Note (Signed)
Addended by: Gwenevere Abbot on: 06/14/2023 05:01 PM   Modules accepted: Orders

## 2023-06-17 DIAGNOSIS — G932 Benign intracranial hypertension: Secondary | ICD-10-CM | POA: Diagnosis not present

## 2023-06-19 DIAGNOSIS — L732 Hidradenitis suppurativa: Secondary | ICD-10-CM | POA: Diagnosis not present

## 2023-06-19 DIAGNOSIS — L910 Hypertrophic scar: Secondary | ICD-10-CM | POA: Diagnosis not present

## 2023-06-20 ENCOUNTER — Encounter: Payer: Self-pay | Admitting: Medical

## 2023-06-21 DIAGNOSIS — Z1211 Encounter for screening for malignant neoplasm of colon: Secondary | ICD-10-CM | POA: Diagnosis not present

## 2023-06-24 ENCOUNTER — Encounter: Payer: Self-pay | Admitting: Medical

## 2023-06-25 ENCOUNTER — Encounter (HOSPITAL_COMMUNITY): Payer: Self-pay | Admitting: Radiology

## 2023-06-25 ENCOUNTER — Emergency Department (HOSPITAL_COMMUNITY): Admission: EM | Admit: 2023-06-25 | Discharge: 2023-06-25 | Disposition: A | Discharge status: Home / Self Care

## 2023-06-25 ENCOUNTER — Other Ambulatory Visit: Payer: Self-pay | Admitting: Medical

## 2023-06-25 ENCOUNTER — Emergency Department (HOSPITAL_COMMUNITY)

## 2023-06-25 DIAGNOSIS — N134 Hydroureter: Secondary | ICD-10-CM | POA: Diagnosis not present

## 2023-06-25 DIAGNOSIS — N201 Calculus of ureter: Secondary | ICD-10-CM

## 2023-06-25 DIAGNOSIS — E119 Type 2 diabetes mellitus without complications: Secondary | ICD-10-CM | POA: Insufficient documentation

## 2023-06-25 DIAGNOSIS — R112 Nausea with vomiting, unspecified: Secondary | ICD-10-CM | POA: Diagnosis not present

## 2023-06-25 DIAGNOSIS — R6889 Other general symptoms and signs: Secondary | ICD-10-CM | POA: Diagnosis not present

## 2023-06-25 DIAGNOSIS — N132 Hydronephrosis with renal and ureteral calculous obstruction: Secondary | ICD-10-CM | POA: Insufficient documentation

## 2023-06-25 DIAGNOSIS — Z79899 Other long term (current) drug therapy: Secondary | ICD-10-CM | POA: Insufficient documentation

## 2023-06-25 DIAGNOSIS — Z7951 Long term (current) use of inhaled steroids: Secondary | ICD-10-CM | POA: Diagnosis not present

## 2023-06-25 DIAGNOSIS — I10 Essential (primary) hypertension: Secondary | ICD-10-CM | POA: Diagnosis not present

## 2023-06-25 DIAGNOSIS — D1771 Benign lipomatous neoplasm of kidney: Secondary | ICD-10-CM | POA: Diagnosis not present

## 2023-06-25 DIAGNOSIS — R109 Unspecified abdominal pain: Secondary | ICD-10-CM | POA: Diagnosis not present

## 2023-06-25 DIAGNOSIS — Z794 Long term (current) use of insulin: Secondary | ICD-10-CM | POA: Insufficient documentation

## 2023-06-25 DIAGNOSIS — K449 Diaphragmatic hernia without obstruction or gangrene: Secondary | ICD-10-CM | POA: Diagnosis not present

## 2023-06-25 LAB — CBC WITH DIFFERENTIAL/PLATELET
Abs Immature Granulocytes: 0.08 10*3/uL — ABNORMAL HIGH (ref 0.00–0.07)
Basophils Absolute: 0 10*3/uL (ref 0.0–0.1)
Basophils Relative: 0 %
Eosinophils Absolute: 0.1 10*3/uL (ref 0.0–0.5)
Eosinophils Relative: 0 %
HCT: 37.6 % (ref 36.0–46.0)
Hemoglobin: 12 g/dL (ref 12.0–15.0)
Immature Granulocytes: 1 %
Lymphocytes Relative: 12 %
Lymphs Abs: 1.5 10*3/uL (ref 0.7–4.0)
MCH: 29.6 pg (ref 26.0–34.0)
MCHC: 31.9 g/dL (ref 30.0–36.0)
MCV: 92.6 fL (ref 80.0–100.0)
Monocytes Absolute: 0.5 10*3/uL (ref 0.1–1.0)
Monocytes Relative: 4 %
Neutro Abs: 10.1 10*3/uL — ABNORMAL HIGH (ref 1.7–7.7)
Neutrophils Relative %: 83 %
Platelets: 295 10*3/uL (ref 150–400)
RBC: 4.06 MIL/uL (ref 3.87–5.11)
RDW: 14.4 % (ref 11.5–15.5)
WBC: 12.3 10*3/uL — ABNORMAL HIGH (ref 4.0–10.5)
nRBC: 0 % (ref 0.0–0.2)

## 2023-06-25 LAB — URINALYSIS, ROUTINE W REFLEX MICROSCOPIC
Bacteria, UA: NONE SEEN
Bilirubin Urine: NEGATIVE
Glucose, UA: >=500 mg/dL — AB
Ketones, ur: 20 mg/dL — AB
Leukocytes,Ua: NEGATIVE
Nitrite: NEGATIVE
Protein, ur: NEGATIVE mg/dL
Specific Gravity, Urine: 1.014 (ref 1.005–1.030)
pH: 7 (ref 5.0–8.0)

## 2023-06-25 LAB — COMPREHENSIVE METABOLIC PANEL
ALT: 30 U/L (ref 0–44)
AST: 18 U/L (ref 15–41)
Albumin: 3.9 g/dL (ref 3.5–5.0)
Alkaline Phosphatase: 64 U/L (ref 38–126)
Anion gap: 9 (ref 5–15)
BUN: 20 mg/dL (ref 6–20)
CO2: 12 mmol/L — ABNORMAL LOW (ref 22–32)
Calcium: 8.8 mg/dL — ABNORMAL LOW (ref 8.9–10.3)
Chloride: 116 mmol/L — ABNORMAL HIGH (ref 98–111)
Creatinine, Ser: 0.98 mg/dL (ref 0.44–1.00)
GFR, Estimated: >60 mL/min (ref >60)
Glucose, Bld: 285 mg/dL — ABNORMAL HIGH (ref 70–99)
Potassium: 3.6 mmol/L (ref 3.5–5.1)
Sodium: 137 mmol/L (ref 135–145)
Total Bilirubin: 1.2 mg/dL (ref 0.0–1.2)
Total Protein: 7.4 g/dL (ref 6.5–8.1)

## 2023-06-25 MED ORDER — ONDANSETRON 4 MG PO TBDP: 4.0000 mg | ORAL_TABLET | Freq: Three times a day (TID) | ORAL | 0 refills | Status: DC | PRN

## 2023-06-25 MED ORDER — ONDANSETRON HCL 4 MG/2ML IJ SOLN
4.0000 mg | Freq: Once | INTRAMUSCULAR | Status: AC
Start: 2023-06-25 — End: 2023-06-25
  Administered 2023-06-25: 4 mg via INTRAVENOUS
  Filled 2023-06-25: qty 2

## 2023-06-25 MED ORDER — KETOROLAC TROMETHAMINE 10 MG PO TABS: 10.0000 mg | ORAL_TABLET | Freq: Four times a day (QID) | ORAL | 0 refills | Status: DC | PRN

## 2023-06-25 MED ORDER — FENTANYL CITRATE PF 50 MCG/ML IJ SOSY
50.0000 ug | PREFILLED_SYRINGE | Freq: Once | INTRAMUSCULAR | Status: AC
  Administered 2023-06-25: 50 ug via INTRAVENOUS
  Filled 2023-06-25: qty 1

## 2023-06-25 MED ORDER — SODIUM CHLORIDE 0.9 % IV BOLUS
1000.0000 mL | Freq: Once | INTRAVENOUS | Status: AC
  Administered 2023-06-25: 1000 mL via INTRAVENOUS

## 2023-06-25 MED ORDER — OXYCODONE-ACETAMINOPHEN 5-325 MG PO TABS: 1.0000 | ORAL_TABLET | Freq: Four times a day (QID) | ORAL | 0 refills | Status: DC | PRN

## 2023-06-25 MED ORDER — TAMSULOSIN HCL 0.4 MG PO CAPS: 0.4000 mg | ORAL_CAPSULE | Freq: Every day | ORAL | 0 refills | Status: DC

## 2023-06-25 MED ORDER — KETOROLAC TROMETHAMINE 30 MG/ML IJ SOLN
30.0000 mg | Freq: Once | INTRAMUSCULAR | Status: AC
  Administered 2023-06-25: 30 mg via INTRAVENOUS
  Filled 2023-06-25: qty 1

## 2023-06-25 NOTE — Addendum Note (Signed)
 Addended by: Gwenevere Abbot on: 06/25/2023 04:52 PM   Modules accepted: Orders

## 2023-06-25 NOTE — Discharge Instructions (Addendum)
 You were seen in the ER today with concerns of flank pain and urinary discomfort. You were found to have a kidney stone on your right side that measures 5.60mm. I have sent a combination of several medications to your pharmacy to help with the symptoms and attempt to help with passing this stone. Please make sure you take these medications as prescribed. Return to the ER for any concerns of new or worsening symptoms.  I would recommend reaching out to [facility] if you feel that your symptoms are not improving.

## 2023-06-25 NOTE — ED Provider Triage Note (Addendum)
 Emergency Medicine Provider Triage Evaluation Note  Theresa Carter , a 53 y.o. female  was evaluated in triage.  Pt complains of flank pain. Patient reports that she has been experiencing right sided flank pain since around 10am this morning. Pain radiates towards her right abdomen. Nausea and vomiting present. Prior kidney stone years ago. No fevers, chills, or bodyaches. Endorsing dysuria but no hematuria.  Review of Systems  Positive: As above Negative: As above  Physical Exam  BP (!) 164/83 (BP Location: Left Arm)   Pulse 87   Temp (!) 97.4 F (36.3 C) (Oral)   Resp 18   SpO2 100%  Gen:   Awake, uncomfortable Resp:  Normal effort  MSK:   Moves extremities without difficulty  Other:  Right CVA tenderness, no focal abdominal tenderness  Medical Decision Making  Medically screening exam initiated at 2:42 PM.  Appropriate orders placed.  Oberia Beaudoin was informed that the remainder of the evaluation will be completed by another provider, this initial triage assessment does not replace that evaluation, and the importance of remaining in the ED until their evaluation is complete.     Smitty Knudsen, PA-C 06/25/23 1444    Smitty Knudsen, PA-C 06/25/23 1445

## 2023-06-25 NOTE — ED Notes (Signed)
 Reviewed D/C information with the patient, pt verbalized understanding. No additional concerns at this time.

## 2023-06-25 NOTE — ED Triage Notes (Signed)
 Pt arrives via [facility] from home c/o R abd and flank pain. Pt also states she has weak urine flow with incomplete emptying. Pt endorses hx of kidney stone. Episodes of emesis PTA, given 4 mg IV zofran.

## 2023-06-25 NOTE — ED Provider Notes (Signed)
 [facility] EMERGENCY DEPARTMENT AT [facility] Provider Note   CSN: [id number] Arrival date & time: 06/25/23  1432     History Chief Complaint  Patient presents with   Flank Pain   Dysuria    Theresa Carter is a 53 y.o. female.  Patient past history significant for prior kidney stone, hypertension, diabetes presents to the ED with concerns of flank pain.  Patient brought in by EMS with concerns of right-sided flank pain with radiation towards the right abdomen.  Endorsing some urinary discomfort with feelings of incomplete voiding.  Denies any significant dysuria.  No visible hematuria.   Flank Pain  Dysuria Associated symptoms: flank pain        Home Medications Prior to Admission medications   Medication Sig Start Date End Date Taking? Authorizing Provider  ketorolac (TORADOL) 10 MG tablet Take 1 tablet (10 mg total) by mouth every 6 (six) hours as needed. 06/25/23  Yes Maryanna Shape A, PA-C  ondansetron (ZOFRAN-ODT) 4 MG disintegrating tablet Take 1 tablet (4 mg total) by mouth every 8 (eight) hours as needed for nausea or vomiting. 06/25/23  Yes Smitty Knudsen, PA-C  oxyCODONE-acetaminophen (PERCOCET/ROXICET) 5-325 MG tablet Take 1 tablet by mouth every 6 (six) hours as needed for severe pain (pain score 7-10). 06/25/23  Yes Tylan Briguglio A, PA-C  tamsulosin (FLOMAX) 0.4 MG CAPS capsule Take 1 capsule (0.4 mg total) by mouth at bedtime. 06/25/23  Yes Smitty Knudsen, PA-C  acetaminophen (TYLENOL) 500 MG tablet Take 1,000 mg by mouth every 6 (six) hours as needed for mild pain.    [provider]  Adalimumab (HUMIRA PEN) 40 MG/0.4ML PNKT Inject 40 mg into the skin once a week. Tuesday 09/29/20   [provider]  albuterol (VENTOLIN HFA) 108 (90 Base) MCG/ACT inhaler Inhale 2 puffs into the lungs every 6 (six) hours as needed for wheezing or shortness of breath. 12/19/22   Saguier, Ramon Dredge, PA-C  amLODipine (NORVASC) 5 MG tablet Take 1 tablet (5 mg total) by  mouth daily. 08/14/22 08/14/23  Saguier, Ramon Dredge, PA-C  benzonatate (TESSALON) 100 MG capsule Take 1 capsule (100 mg total) by mouth 3 (three) times daily as needed for cough. 12/19/22   Saguier, Ramon Dredge, PA-C  benzonatate (TESSALON) 100 MG capsule Take 1 capsule (100 mg total) by mouth 3 (three) times daily as needed for cough. 06/14/23   Saguier, Ramon Dredge, PA-C  Blood Glucose Monitoring Suppl (ONETOUCH VERIO) w/Device KIT 1 kit by Does not apply route daily. Check blood glucose once daily as needed 12/21/20   Gardenia Phlegm, MD  busPIRone (BUSPAR) 15 MG tablet TAKE 1 TABLET BY MOUTH 2 TIMES A DAY 06/13/23   Saguier, Ramon Dredge, PA-C  chlorpheniramine-HYDROcodone (TUSSIONEX) 10-8 MG/5ML Take 5 mLs by mouth every 12 (twelve) hours as needed for cough. 06/12/23   Saguier, Ramon Dredge, PA-C  cyclobenzaprine (FLEXERIL) 5 MG tablet TAKE 1 TABLET BY MOUTH EVERY NIGHT AT BEDTIME AS NEEDED FOR NECK PAIN 06/25/23   Saguier, Ramon Dredge, PA-C  dutasteride (AVODART) 0.5 MG capsule Take one tablet daily on week days (Monday-Friday). Do not take if pregnant or trying to get pregnant. 04/26/21   [provider]  fluticasone (FLONASE) 50 MCG/ACT nasal spray Place 2 sprays into both nostrils daily. 12/19/22   Saguier, Ramon Dredge, PA-C  glucose blood [facility] VERIO) test strip Check blood glucose once daily as needed 12/21/20   Gardenia Phlegm, MD  Insulin Pen Needle (BD PEN NEEDLE NANO 2ND GEN) 32G X 4  MM MISC USE TO INJECT DIABETIC MEDICATION INTO THE SKIN 1 TIME DAILY 03/20/22   Lyndle Herrlich, MD  ipratropium-albuterol (DUONEB) 0.5-2.5 (3) MG/3ML SOLN Take 3 mLs by nebulization every 4 (four) hours as needed. 06/12/23   Saguier, Ramon Dredge, PA-C  levocetirizine (XYZAL) 5 MG tablet Take 1 tablet (5 mg total) by mouth every evening. 07/13/22   Saguier, Ramon Dredge, PA-C  LORazepam (ATIVAN) 1 MG tablet Take 1 tablet (1 mg total) by mouth 2 (two) times daily as needed for anxiety. 04/16/23   Derwood Kaplan, MD  losartan (COZAAR) 100 MG tablet  Take 1 tablet (100 mg total) by mouth daily. 10/18/22   Saguier, Ramon Dredge, PA-C  montelukast (SINGULAIR) 10 MG tablet Take 1 tablet (10 mg total) by mouth at bedtime. 05/29/23   Saguier, Ramon Dredge, PA-C  mupirocin ointment (BACTROBAN) 2 % Apply topically. 11/15/21   [provider]  omeprazole (PRILOSEC) 40 MG capsule Take 1 capsule (40 mg total) by mouth daily. 08/28/22   Saguier, Ramon Dredge, PA-C  OneTouch Delica Lancets 30G MISC Check blood sugar once daily as needed 12/21/20   Gardenia Phlegm, MD  oseltamivir (TAMIFLU) 75 MG capsule Take 1 capsule (75 mg total) by mouth 2 (two) times daily. 06/03/23   Wallis Bamberg, PA-C  OZEMPIC, 2 MG/DOSE, 8 MG/3ML SOPN DIAL AND INJECT UNDER THE SKIN 2 MG WEEKLY 11/29/22   Saguier, Ramon Dredge, PA-C  predniSONE (DELTASONE) 10 MG tablet Take 1 tablet (10 mg total) by mouth daily with breakfast. 06/12/23   Saguier, Ramon Dredge, PA-C  predniSONE (STERAPRED UNI-PAK 21 TAB) 10 MG (21) TBPK tablet Taper over 6 days. 06/07/23   Saguier, Ramon Dredge, PA-C  promethazine (PHENERGAN) 25 MG tablet Take 1 tablet (25 mg total) by mouth every 8 (eight) hours as needed (headache). Patient not taking: Reported on 03/05/2023 08/25/22   Tanda Rockers A, DO  rosuvastatin (CRESTOR) 5 MG tablet Take 1 tablet (5 mg total) by mouth daily. 03/27/23   Saguier, Ramon Dredge, PA-C  SYMBICORT 160-4.5 MCG/ACT inhaler Inhale 2 puffs into the lungs in the morning and at bedtime. 05/16/23   Saguier, Ramon Dredge, PA-C  SYRINGE-NEEDLE, DISP, 3 ML (LUER LOCK SAFETY SYRINGES) 25G X 1" 3 ML MISC Use as directed with weekly b12 injections 10/08/22   Saguier, Ramon Dredge, PA-C      Allergies    Patient has no known allergies.    Review of Systems   Review of Systems  Genitourinary:  Positive for dysuria and flank pain.  All other systems reviewed and are negative.   Physical Exam Updated Vital Signs BP 136/77   Pulse 87   Temp 98.2 F (36.8 C)   Resp 18   SpO2 100%  Physical Exam Vitals and nursing note reviewed.  Constitutional:       General: She is not in acute distress.    Appearance: She is well-developed.  HENT:     Head: Normocephalic and atraumatic.  Eyes:     Conjunctiva/sclera: Conjunctivae normal.  Cardiovascular:     Rate and Rhythm: Normal rate and regular rhythm.     Heart sounds: No murmur heard. Pulmonary:     Effort: Pulmonary effort is normal. No respiratory distress.     Breath sounds: Normal breath sounds.  Abdominal:     General: Abdomen is flat. There is no distension.     Palpations: Abdomen is soft.     Tenderness: There is no abdominal tenderness. There is right CVA tenderness. There is no left CVA tenderness.  Musculoskeletal:  General: No swelling.     Cervical back: Neck supple.  Skin:    General: Skin is warm and dry.     Capillary Refill: Capillary refill takes less than 2 seconds.  Neurological:     Mental Status: She is alert.  Psychiatric:        Mood and Affect: Mood normal.     ED Results / Procedures / Treatments   Labs (all labs ordered are listed, but only abnormal results are displayed) Labs Reviewed  CBC WITH DIFFERENTIAL/PLATELET - Abnormal; Notable for the following components:      Result Value   WBC 12.3 (*)    Neutro Abs 10.1 (*)    Abs Immature Granulocytes 0.08 (*)    All other components within normal limits  COMPREHENSIVE METABOLIC PANEL - Abnormal; Notable for the following components:   Chloride 116 (*)    CO2 12 (*)    Glucose, Bld 285 (*)    Calcium 8.8 (*)    All other components within normal limits  URINALYSIS, ROUTINE W REFLEX MICROSCOPIC - Abnormal; Notable for the following components:   Color, Urine STRAW (*)    Glucose, UA >=500 (*)    Hgb urine dipstick SMALL (*)    Ketones, ur 20 (*)    All other components within normal limits    EKG None  Radiology CT Renal Stone Study Result Date: 06/25/2023 CLINICAL DATA:  Flank pain. EXAM: CT ABDOMEN AND PELVIS WITHOUT CONTRAST TECHNIQUE: Multidetector CT imaging of the abdomen  and pelvis was performed following the standard protocol without IV contrast. RADIATION DOSE REDUCTION: This exam was performed according to the departmental dose-optimization program which includes automated exposure control, adjustment of the mA and/or kV according to patient size and/or use of iterative reconstruction technique. COMPARISON:  March 10, 2021 FINDINGS: Lower chest: Absent left breast soft tissue is noted which may be positional in nature. Hepatobiliary: No focal liver abnormality is seen. No gallstones, gallbladder wall thickening, or biliary dilatation. Pancreas: Unremarkable. No pancreatic ductal dilatation or surrounding inflammatory changes. Spleen: Normal in size without focal abnormality. Adrenals/Urinary Tract: Adrenal glands are unremarkable. Kidneys are normal in size. A stable 3.5 cm x 2.2 cm x 23.2 cm predominately fat attenuation (approximately -58.00 Hounsfield units) mass is seen within the anterolateral aspect of the lower pole of the left kidney. A 5.7 mm a thin linear calcification is suspected within the distal right ureter, at the right UVJ (axial CT image 73, CT series 2). There is moderate to marked severity right-sided hydronephrosis, hydroureter and perinephric inflammatory fat stranding. The urinary bladder is poorly distended and subsequently limited in evaluation. Stomach/Bowel: There is a small hiatal hernia. Appendix appears normal. No evidence of bowel wall thickening, distention, or inflammatory changes. Vascular/Lymphatic: A circumaortic left renal vein is noted. No enlarged abdominal or pelvic lymph nodes. Reproductive: Uterus and bilateral adnexa are unremarkable. Other: No abdominal wall hernia or abnormality. No abdominopelvic ascites. Musculoskeletal: No acute or significant osseous findings. IMPRESSION: 1. 5.7 mm distal right ureteral calculus with moderate to marked severity right-sided hydronephrosis, hydroureter and perinephric inflammatory fat stranding.  2. Stable left renal angiomyolipoma. 3. Small hiatal hernia. Electronically Signed   By: Aram Candela M.D.   On: 06/25/2023 18:45    Procedures Procedures    Medications Ordered in ED Medications  fentaNYL (SUBLIMAZE) injection 50 mcg (50 mcg Intravenous Given 06/25/23 1815)  ondansetron (ZOFRAN) injection 4 mg (4 mg Intravenous Given 06/25/23 1815)  sodium chloride 0.9 % bolus 1,000  mL (0 mLs Intravenous Stopped 06/25/23 1921)  ketorolac (TORADOL) 30 MG/ML injection 30 mg (30 mg Intravenous Given 06/25/23 2007)    ED Course/ Medical Decision Making/ A&P                                 Medical Decision Making Amount and/or Complexity of Data Reviewed Labs: ordered. Radiology: ordered.  Risk Prescription drug management.   This patient presents to the ED for concern of flank pain. Differential diagnosis includes urolithiasis, pyelonephritis, urosepsis, UTI, appendicitis    Lab Tests:  I Ordered, and personally interpreted labs.  The pertinent results include: CBC with leukocytosis at 12.3, CMP with no evidence of AKI, UA with no clear signs of infection   Imaging Studies ordered:  I ordered imaging studies including CT renal I independently visualized and interpreted imaging which showed 1. 5.7 mm distal right ureteral calculus with moderate to marked severity right-sided hydronephrosis, hydroureter and perinephric inflammatory fat stranding. 2. Stable left renal angiomyolipoma. 3. Small hiatal hernia. I agree with the radiologist interpretation   Medicines ordered and prescription drug management:  I ordered medication including fluids, Toradol, fentanyl, Zofran for dehydration, pain, nausea Reevaluation of the patient after these medicines showed that the patient improved I have reviewed the patients home medicines and have made adjustments as needed   Problem List / ED Course:  Patient presents emergency department concerns of flank pain.  Reports onset of symptoms  earlier today.  Endorsing right-sided flank tenderness with some radiation towards the right abdomen.  Denies any recent fever chills or bodyaches.  Has had multiple episodes of vomiting but no diarrhea.  Endorses prior kidney stone when she was in her 66s or 30s but cannot recall. Physical exam does reveal right CVA tenderness.  Patient appears to be fairly uncomfortable.  Concerned this time is for urolithiasis.  Will initiate fluid bolus to try to encourage urine production so we can run UA for assessment of possible infected stone.  CT renal stone study ordered for assessment. Labs show mild leukocytosis which may be reactive due to pain and vomiting.  CMP shows no evident AKI or severe signs of dehydration.  UA without clear signs of infection. CT renal shows 5.7 mm stone in the right ureter.  Notable hydronephrosis of the right kidney.  No other acute or abnormal findings. Patient with improvement after toradol more than fentanyl. Patient stable for discharge at this time and will be discharged home with pain medicine, nausea, and flomax. Advised patient to return to the ED for any concerns of new or worsening symptoms.   Final Clinical Impression(s) / ED Diagnoses Final diagnoses:  Calculus of ureter    Rx / DC Orders ED Discharge Orders          Ordered    ketorolac (TORADOL) 10 MG tablet  Every 6 hours PRN        06/25/23 2049    oxyCODONE-acetaminophen (PERCOCET/ROXICET) 5-325 MG tablet  Every 6 hours PRN        06/25/23 2049    ondansetron (ZOFRAN-ODT) 4 MG disintegrating tablet  Every 8 hours PRN        06/25/23 2049    tamsulosin (FLOMAX) 0.4 MG CAPS capsule  Daily at bedtime        06/25/23 2049              Smitty Knudsen, PA-C 06/25/23 2221  Charlynne Pander, MD 06/28/23 [phone]

## 2023-06-26 ENCOUNTER — Inpatient Hospital Stay (HOSPITAL_COMMUNITY)

## 2023-06-26 ENCOUNTER — Inpatient Hospital Stay (HOSPITAL_COMMUNITY): Admitting: Anesthesiology

## 2023-06-26 ENCOUNTER — Other Ambulatory Visit: Payer: Self-pay | Admitting: Urology

## 2023-06-26 ENCOUNTER — Encounter (HOSPITAL_COMMUNITY)
Admission: RE | Disposition: A | Discharge status: Home / Self Care | Payer: Self-pay | Source: Home / Self Care | Attending: Urology

## 2023-06-26 ENCOUNTER — Ambulatory Visit (HOSPITAL_COMMUNITY)
Admission: RE | Admit: 2023-06-26 | Discharge: 2023-06-26 | Disposition: A | Discharge status: Home / Self Care | Attending: Urology | Admitting: Urology

## 2023-06-26 ENCOUNTER — Encounter (HOSPITAL_COMMUNITY): Payer: Self-pay | Admitting: Urology

## 2023-06-26 ENCOUNTER — Other Ambulatory Visit: Payer: Self-pay

## 2023-06-26 ENCOUNTER — Inpatient Hospital Stay (HOSPITAL_BASED_OUTPATIENT_CLINIC_OR_DEPARTMENT_OTHER): Admitting: Anesthesiology

## 2023-06-26 DIAGNOSIS — N1330 Unspecified hydronephrosis: Secondary | ICD-10-CM | POA: Diagnosis not present

## 2023-06-26 DIAGNOSIS — Z6833 Body mass index (BMI) 33.0-33.9, adult: Secondary | ICD-10-CM | POA: Diagnosis not present

## 2023-06-26 DIAGNOSIS — R1084 Generalized abdominal pain: Secondary | ICD-10-CM | POA: Insufficient documentation

## 2023-06-26 DIAGNOSIS — I10 Essential (primary) hypertension: Secondary | ICD-10-CM | POA: Insufficient documentation

## 2023-06-26 DIAGNOSIS — E669 Obesity, unspecified: Secondary | ICD-10-CM | POA: Diagnosis not present

## 2023-06-26 DIAGNOSIS — N132 Hydronephrosis with renal and ureteral calculous obstruction: Secondary | ICD-10-CM | POA: Insufficient documentation

## 2023-06-26 DIAGNOSIS — J45909 Unspecified asthma, uncomplicated: Secondary | ICD-10-CM | POA: Insufficient documentation

## 2023-06-26 DIAGNOSIS — F419 Anxiety disorder, unspecified: Secondary | ICD-10-CM | POA: Insufficient documentation

## 2023-06-26 DIAGNOSIS — Z7985 Long-term (current) use of injectable non-insulin antidiabetic drugs: Secondary | ICD-10-CM | POA: Insufficient documentation

## 2023-06-26 DIAGNOSIS — N200 Calculus of kidney: Secondary | ICD-10-CM

## 2023-06-26 DIAGNOSIS — E119 Type 2 diabetes mellitus without complications: Secondary | ICD-10-CM | POA: Diagnosis not present

## 2023-06-26 DIAGNOSIS — Z79899 Other long term (current) drug therapy: Secondary | ICD-10-CM | POA: Insufficient documentation

## 2023-06-26 DIAGNOSIS — K219 Gastro-esophageal reflux disease without esophagitis: Secondary | ICD-10-CM | POA: Diagnosis not present

## 2023-06-26 DIAGNOSIS — Z87891 Personal history of nicotine dependence: Secondary | ICD-10-CM | POA: Diagnosis not present

## 2023-06-26 DIAGNOSIS — N201 Calculus of ureter: Secondary | ICD-10-CM | POA: Diagnosis not present

## 2023-06-26 DIAGNOSIS — E785 Hyperlipidemia, unspecified: Secondary | ICD-10-CM | POA: Diagnosis not present

## 2023-06-26 HISTORY — PX: CYSTOSCOPY W/ URETERAL STENT PLACEMENT: SHX1429

## 2023-06-26 LAB — GLUCOSE, CAPILLARY
Glucose-Capillary: 169 mg/dL — ABNORMAL HIGH (ref 70–99)
Glucose-Capillary: 177 mg/dL — ABNORMAL HIGH (ref 70–99)

## 2023-06-26 SURGERY — CYSTOSCOPY, WITH RETROGRADE PYELOGRAM AND URETERAL STENT INSERTION
Anesthesia: General | Laterality: Right

## 2023-06-26 MED ORDER — HYDROMORPHONE HCL 1 MG/ML IJ SOLN: 0.2500 mg | INTRAMUSCULAR | Status: DC | PRN

## 2023-06-26 MED ORDER — ONDANSETRON HCL 4 MG/2ML IJ SOLN
INTRAMUSCULAR | Status: AC
Start: 2023-06-26 — End: ?
  Filled 2023-06-26: qty 2

## 2023-06-26 MED ORDER — OXYCODONE HCL 5 MG PO TABS
5.0000 mg | ORAL_TABLET | Freq: Once | ORAL | Status: AC | PRN
  Administered 2023-06-26: 5 mg via ORAL

## 2023-06-26 MED ORDER — DEXAMETHASONE SODIUM PHOSPHATE 10 MG/ML IJ SOLN
INTRAMUSCULAR | Status: AC
  Filled 2023-06-26: qty 1

## 2023-06-26 MED ORDER — ACETAMINOPHEN 10 MG/ML IV SOLN
INTRAVENOUS | Status: DC | PRN
  Administered 2023-06-26: 1000 mg via INTRAVENOUS

## 2023-06-26 MED ORDER — OXYCODONE HCL 5 MG/5ML PO SOLN: 5.0000 mg | Freq: Once | ORAL | Status: AC | PRN

## 2023-06-26 MED ORDER — OXYCODONE HCL 5 MG PO TABS
ORAL_TABLET | ORAL | Status: AC
  Filled 2023-06-26: qty 1

## 2023-06-26 MED ORDER — FENTANYL CITRATE (PF) 100 MCG/2ML IJ SOLN
INTRAMUSCULAR | Status: DC | PRN
  Administered 2023-06-26 (×2): 50 ug via INTRAVENOUS

## 2023-06-26 MED ORDER — IOHEXOL 300 MG/ML  SOLN
INTRAMUSCULAR | Status: DC | PRN
  Administered 2023-06-26: 10 mL

## 2023-06-26 MED ORDER — CEFAZOLIN SODIUM-DEXTROSE 2-4 GM/100ML-% IV SOLN
2.0000 g | INTRAVENOUS | Status: AC
  Administered 2023-06-26: 2 g via INTRAVENOUS
  Filled 2023-06-26: qty 100

## 2023-06-26 MED ORDER — PROPOFOL 10 MG/ML IV BOLUS
INTRAVENOUS | Status: AC
  Filled 2023-06-26: qty 20

## 2023-06-26 MED ORDER — DEXAMETHASONE SODIUM PHOSPHATE 4 MG/ML IJ SOLN
INTRAMUSCULAR | Status: DC | PRN
  Administered 2023-06-26: 5 mg via INTRAVENOUS

## 2023-06-26 MED ORDER — INSULIN ASPART 100 UNIT/ML IJ SOLN: 0.0000 [IU] | INTRAMUSCULAR | Status: DC | PRN

## 2023-06-26 MED ORDER — LIDOCAINE HCL (PF) 2 % IJ SOLN
INTRAMUSCULAR | Status: AC
  Filled 2023-06-26: qty 5

## 2023-06-26 MED ORDER — CHLORHEXIDINE GLUCONATE 0.12 % MT SOLN
15.0000 mL | Freq: Once | OROMUCOSAL | Status: AC
  Administered 2023-06-26: 15 mL via OROMUCOSAL

## 2023-06-26 MED ORDER — FENTANYL CITRATE (PF) 100 MCG/2ML IJ SOLN
INTRAMUSCULAR | Status: AC
  Filled 2023-06-26: qty 2

## 2023-06-26 MED ORDER — SULFAMETHOXAZOLE-TRIMETHOPRIM 800-160 MG PO TABS: 1.0000 | ORAL_TABLET | Freq: Two times a day (BID) | ORAL | 0 refills | Status: DC

## 2023-06-26 MED ORDER — HYOSCYAMINE SULFATE 0.125 MG PO TBDP: 0.1250 mg | ORAL_TABLET | Freq: Four times a day (QID) | ORAL | 0 refills | Status: DC | PRN

## 2023-06-26 MED ORDER — ACETAMINOPHEN 10 MG/ML IV SOLN
INTRAVENOUS | Status: AC
Start: 2023-06-26 — End: 2023-06-26
  Filled 2023-06-26: qty 100

## 2023-06-26 MED ORDER — ONDANSETRON HCL 4 MG/2ML IJ SOLN: 4.0000 mg | Freq: Once | INTRAMUSCULAR | Status: DC | PRN

## 2023-06-26 MED ORDER — STERILE WATER FOR IRRIGATION IR SOLN
Status: DC | PRN
  Administered 2023-06-26: 3000 mL

## 2023-06-26 MED ORDER — PROPOFOL 10 MG/ML IV BOLUS
INTRAVENOUS | Status: DC | PRN
  Administered 2023-06-26: 150 mg via INTRAVENOUS

## 2023-06-26 MED ORDER — LIDOCAINE 2% (20 MG/ML) 5 ML SYRINGE
INTRAMUSCULAR | Status: DC | PRN
  Administered 2023-06-26: 80 mg via INTRAVENOUS

## 2023-06-26 MED ORDER — LACTATED RINGERS IV SOLN: INTRAVENOUS | Status: DC

## 2023-06-26 SURGICAL SUPPLY — 11 items
BAG URO CATCHER STRL LF (MISCELLANEOUS) ×1 IMPLANT
CATH URETL OPEN END 6FR 70 (CATHETERS) ×1 IMPLANT
CLOTH BEACON ORANGE TIMEOUT ST (SAFETY) ×1 IMPLANT
GLOVE BIO SURGEON STRL SZ 6.5 (GLOVE) ×1 IMPLANT
GOWN STRL REUS W/ TWL LRG LVL3 (GOWN DISPOSABLE) ×1 IMPLANT
GUIDEWIRE STR DUAL SENSOR (WIRE) ×1 IMPLANT
KIT TURNOVER KIT A (KITS) IMPLANT
MANIFOLD NEPTUNE II (INSTRUMENTS) ×1 IMPLANT
PACK CYSTO (CUSTOM PROCEDURE TRAY) ×1 IMPLANT
TUBING CONNECTING 10 (TUBING) ×1 IMPLANT
TUBING UROLOGY SET (TUBING) IMPLANT

## 2023-06-26 NOTE — H&P (Signed)
 CC/HPI: cc: Urolithiasis   06/26/2023: 53 year old woman with a history of urolithiasis who developed acute onset nausea and vomiting as well as right-sided flank pain yesterday and went to the ED. CT showed a 5 mm distal right UVJ calculus. Patient has not responded to Toradol today. She continues to have significant pain as well as nausea and vomiting. She is on little work like this.     ALLERGIES: seasonal allergies    MEDICATIONS: Ketorolac Tromethamine 10 mg tablet  Omeprazole  Albuterol Sulfate  Amlodipine Besilate  Aspirin Ec 81 mg tablet, delayed release  Benzonatate 100 mg capsule  Buspirone Hcl 15 mg tablet  Chlorpheniramine Maleate  Cyclobenzaprine Hcl  Dutasteride 0.5 mg capsule  Flomax 0.4 mg capsule  Fluticasone Propionate  Humira 40 mg/0.8 ml syringe kit  Hydrocodone-Acetaminophen 5 mg-325 mg tablet  Lorazepam 1 mg tablet  Losartan Potassium  Ondansetron Hcl  Ozempic 2 mg/0.75 ml (8 mg/3 ml) pen injector  Rosuvastatin Calcium  Singulair  Symbicort 160 mcg-4.5 mcg/actuation hfa aerosol with adapter  Tylenol  Xyzal 5 mg tablet     GU PSH: No GU PSH    NON-GU PSH: No Non-GU PSH    GU PMH: No GU PMH    NON-GU PMH: No Non-GU PMH    FAMILY HISTORY: No Family History    SOCIAL HISTORY: No Social History    REVIEW OF SYSTEMS:    GU Review Female:   Patient denies frequent urination, hard to postpone urination, burning /pain with urination, get up at night to urinate, leakage of urine, stream starts and stops, trouble starting your stream, have to strain to urinate, and being pregnant.  Gastrointestinal (Upper):   Patient denies nausea, vomiting, and indigestion/ heartburn.  Gastrointestinal (Lower):   Patient denies diarrhea and constipation.  Constitutional:   Patient denies fever, night sweats, weight loss, and fatigue.  Skin:   Patient denies skin rash/ lesion and itching.  Eyes:   Patient denies blurred vision and double vision.  Ears/ Nose/ Throat:    Patient denies sore throat and sinus problems.  Hematologic/Lymphatic:   Patient denies swollen glands and easy bruising.  Cardiovascular:   Patient denies leg swelling and chest pains.  Respiratory:   Patient denies shortness of breath and cough.  Endocrine:   Patient denies excessive thirst.  Musculoskeletal:   Patient reports back pain. Patient denies joint pain.  Neurological:   Patient denies headaches and dizziness.  Psychologic:   Patient denies depression and anxiety.   Notes: Flank pain, abdominal pressure    VITAL SIGNS:      06/26/2023 03:07 PM  Height 65 in / 165.1 cm  BP 114/76 mmHg  Pulse 108 /min  Temperature 98.4 F / 36.8 C   MULTI-SYSTEM PHYSICAL EXAMINATION:    Constitutional: Well-nourished. No physical deformities. Normally developed. Good grooming.  Neck: Neck symmetrical, not swollen. Normal tracheal position.  Respiratory: No labored breathing, no use of accessory muscles.   Skin: No paleness, no jaundice, no cyanosis. No lesion, no ulcer, no rash.  Neurologic / Psychiatric: Oriented to time, oriented to place, oriented to person. No depression, no anxiety, no agitation.  Eyes: Normal conjunctivae. Normal eyelids.  Ears, Nose, Mouth, and Throat: Left ear no scars, no lesions, no masses. Right ear no scars, no lesions, no masses. Nose no scars, no lesions, no masses. Normal hearing. Normal lips.  Musculoskeletal: Normal gait and station of head and neck.     Complexity of Data:  Records Review:   Previous  Hospital Records, Previous Patient Records, POC Tool  Urine Test Review:   Urinalysis  X-Ray Review: C.T. Abdomen/Pelvis: Reviewed Films. Reviewed Report. Discussed With Patient. IMPRESSION:  1. 5.7 mm distal right ureteral calculus with moderate to marked  severity right-sided hydronephrosis, hydroureter and perinephric  inflammatory fat stranding.  2. Stable left renal angiomyolipoma.  3. Small hiatal hernia.    Electronically Signed  By: Aram Candela M.D.  On: 06/25/2023 18:45    PROCEDURES:          Urinalysis w/Scope Dipstick Dipstick Cont'd Micro  Color: Yellow Bilirubin: Neg mg/dL WBC/hpf: 0 - 5/hpf  Appearance: Cloudy Ketones: Trace mg/dL RBC/hpf: 3 - 16/XWR  Specific Gravity: 1.020 Blood: Trace ery/uL Bacteria: Mod (26-50/hpf)  pH: 5.5 Protein: Trace mg/dL Cystals: NS (Not Seen)  Glucose: Neg mg/dL Urobilinogen: 1.0 mg/dL Casts: NS (Not Seen)    Nitrites: Neg Trichomonas: Not Present    Leukocyte Esterase: Neg leu/uL Mucous: Present      Epithelial Cells: 6 - 10/hpf      Yeast: NS (Not Seen)      Sperm: Not Present    ASSESSMENT:      ICD-10 Details  1 GU:   Ureteral calculus - N20.1 Acute, Systemic Symptoms  2   Flank Pain - R10.84 Acute, Systemic Symptoms   PLAN:           Document Letter(s):  Created for Patient: Clinical Summary         Notes:   Urolithiasis:  Patient with obstructing right ureteral calculus and significant pain not well-controlled  -We discussed management options including continuing medical expulsive therapy versus temporizing measure with ureteral stent today followed by definitive ureteroscopy in 2 to 3 weeks.  -Risks and benefits discussed the patient in detail including but not limited to pain, bleeding, infection, damage surrounding structures, need for additional treatment, stent discomfort, ureteral stricture   Schedule stent tonight

## 2023-06-26 NOTE — Anesthesia Procedure Notes (Signed)
 Procedure Name: LMA Insertion Date/Time: 06/26/2023 6:20 PM  Performed by: Vanessa Milford, CRNAPre-anesthesia Checklist: Emergency Drugs available, Patient identified, Suction available and Patient being monitored Patient Re-evaluated:Patient Re-evaluated prior to induction Oxygen Delivery Method: Circle system utilized Preoxygenation: Pre-oxygenation with 100% oxygen Induction Type: IV induction Ventilation: Mask ventilation without difficulty LMA: LMA inserted LMA Size: 4.0 Number of attempts: 1 Placement Confirmation: positive ETCO2 and breath sounds checked- equal and bilateral Tube secured with: Tape Dental Injury: Teeth and Oropharynx as per pre-operative assessment

## 2023-06-26 NOTE — Op Note (Signed)
 Preoperative diagnosis:  Right ureteral calculus with hydronephrosis   Postoperative diagnosis:  same   Procedure:  Cystoscopy right ureteral stent placement - 6Fr x 24 cm no tether right retrograde pyelography with interpretation   Surgeon: Kasandra Knudsen, MD  Anesthesia: General  Complications: None  Intraoperative findings:  right retrograde pyelography demonstrated hydronephrosis to level of the UVJ  EBL: Minimal  Specimens: None  Indication: Theresa Carter is a 53 y.o. patient with 5 mm right UVJ calculus, pain, nausea and vomiting and hydronephrosis on CT of the abdomen pelvis.. After reviewing the management options for treatment, he elected to proceed with the above surgical procedure(s). We have discussed the potential benefits and risks of the procedure, side effects of the proposed treatment, the likelihood of the patient achieving the goals of the procedure, and any potential problems that might occur during the procedure or recuperation. Informed consent has been obtained.  Description of procedure:  The patient was taken to the operating room and general anesthesia was induced.  The patient was placed in the dorsal lithotomy position, prepped and draped in the usual sterile fashion, and preoperative antibiotics were administered. A preoperative time-out was performed.   Cystourethroscopy was performed.  The patient's urethra was examined and was normal. The bladder was then systematically examined in its entirety. There was no evidence for any bladder tumors, stones, or other mucosal pathology.    Attention then turned to the rightureteral orifice and a ureteral catheter was used to intubate the ureteral orifice.  Omnipaque contrast was injected through the ureteral catheter and a retrograde pyelogram was performed with findings as dictated above.  A 0.38 sensor guidewire was then advanced up the right ureter into the renal pelvis under fluoroscopic guidance.  The  wire was then backloaded through the cystoscope and a ureteral stent was advance over the wire using Seldinger technique.  The stent was positioned appropriately under fluoroscopic and cystoscopic guidance.  The wire was then removed with an adequate stent curl noted in the renal pelvis as well as in the bladder.  The bladder was then emptied and the procedure ended.  The patient appeared to tolerate the procedure well and without complications.  The patient was able to be awakened and transferred to the recovery unit in satisfactory condition.   Plan: She will have definitive stone removal in 2 to 3 weeks. discharge home tonight.  Kasandra Knudsen, M.D.

## 2023-06-26 NOTE — Anesthesia Preprocedure Evaluation (Signed)
 Anesthesia Evaluation  Patient identified by MRN, date of birth, ID band Patient awake    Reviewed: Allergy & Precautions, NPO status , Patient's Chart, lab work & pertinent test results  Airway Mallampati: II  TM Distance: >3 FB     Dental no notable dental hx. (+) Dental Advisory Given, Teeth Intact   Pulmonary asthma , former smoker   Pulmonary exam normal breath sounds clear to auscultation       Cardiovascular hypertension, Pt. 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   Peds  Hematology negative hematology ROS (+)   Anesthesia Other Findings   Reproductive/Obstetrics negative OB ROS                              Anesthesia Physical Anesthesia Plan  ASA: 3  Anesthesia Plan: General   Post-op Pain Management: Dilaudid IV, Precedex and Ofirmev IV (intra-op)*   Induction: Intravenous  PONV Risk Score and Plan: 4 or greater and Treatment may vary due to age or medical condition, Midazolam, Dexamethasone and Ondansetron  Airway Management Planned: LMA  Additional Equipment: None  Intra-op Plan:   Post-operative Plan: Extubation in OR  Informed Consent: I have reviewed the patients History and Physical, chart, labs and discussed the procedure including the risks, benefits and alternatives for the proposed anesthesia with the patient or authorized representative who has indicated his/her understanding and acceptance.     Dental advisory given  Plan Discussed with: Anesthesiologist and  CRNA  Anesthesia Plan Comments:          Anesthesia Quick Evaluation

## 2023-06-26 NOTE — Discharge Instructions (Addendum)
    June 26, 2023  [name] [address] [place], [place] [address]  To Whom It May Concern,  Please excuse Theresa Carter from work on 06/26/23 while she is under my clinical care. This may be extended depending on how she recovers.  If you have any questions, please let me know.  Sincerely,   Kasandra Knudsen, M.D.          DISCHARGE INSTRUCTIONS FOR KIDNEY STONE/URETERAL STENT   MEDICATIONS:  1. Resume all your other meds from home  2. AZO over the counter can help with the burning/stinging when you urinate. 3. Hyoscyamine can help with bladder spasms 4. Take tamsulosin, ketorolac and oxycodone as prescribed. 5.  Bactrim is to help treat infection    ACTIVITY:  1. No strenuous activity x 1week  2. No driving while on narcotic pain medications  3. Drink plenty of water  4. Continue to walk at home - you can still get blood clots when you are at home, so keep active, but don't over do it.  5. May return to work/school tomorrow or when you feel ready   BATHING:  1. You can shower and we recommend daily showers    SIGNS/SYMPTOMS TO CALL:  Please call us if you have a fever greater than 101.5, uncontrolled nausea/vomiting, uncontrolled pain, dizziness, unable to urinate, bloody urine, chest pain, shortness of breath, leg swelling, leg pain, redness around wound, drainage from wound, or any other concerns or questions.   You can reach [place] at [phone].   FOLLOW-UP:  1. You will be contacted with your next surgery date

## 2023-06-26 NOTE — Anesthesia Postprocedure Evaluation (Signed)
 Anesthesia Post Note  Patient: Theresa Carter  Procedure(s) Performed: CYSTOSCOPY, WITH RETROGRADE PYELOGRAM AND URETERAL STENT INSERTION (Right)     Patient location during evaluation: PACU Anesthesia Type: General Level of consciousness: awake and alert and oriented Pain management: pain level controlled Vital Signs Assessment: post-procedure vital signs reviewed and stable Respiratory status: spontaneous breathing, nonlabored ventilation and respiratory function stable Cardiovascular status: blood pressure returned to baseline and stable Postop Assessment: no apparent nausea or vomiting Anesthetic complications: no   No notable events documented.  Last Vitals:  Vitals:   06/26/23 1834 06/26/23 1845  BP: 125/83 119/68  Pulse: 85 79  Resp: 19 20  Temp: 36.6 C   SpO2: 100% 99%    Last Pain:  Vitals:   06/26/23 1851  TempSrc:   PainSc: 5                  [name] A.

## 2023-06-26 NOTE — Transfer of Care (Signed)
 Immediate Anesthesia Transfer of Care Note  Patient: Theresa Carter  Procedure(s) Performed: CYSTOSCOPY, WITH RETROGRADE PYELOGRAM AND URETERAL STENT INSERTION (Right)  Patient Location: PACU  Anesthesia Type:General  Level of Consciousness: awake and patient cooperative  Airway & Oxygen Therapy: Patient Spontanous Breathing and Patient connected to face mask  Post-op Assessment: Report given to RN and Post -op Vital signs reviewed and stable  Post vital signs: Reviewed and stable  Last Vitals:  Vitals Value Taken Time  BP    Temp    Pulse    Resp    SpO2      Last Pain:  Vitals:   06/26/23 1659  TempSrc: Oral         Complications: No notable events documented.

## 2023-06-27 ENCOUNTER — Encounter (HOSPITAL_COMMUNITY): Payer: Self-pay | Admitting: Urology

## 2023-06-28 ENCOUNTER — Other Ambulatory Visit (HOSPITAL_COMMUNITY): Payer: Self-pay

## 2023-06-28 DIAGNOSIS — G932 Benign intracranial hypertension: Secondary | ICD-10-CM | POA: Diagnosis not present

## 2023-06-28 DIAGNOSIS — H4710 Unspecified papilledema: Secondary | ICD-10-CM | POA: Diagnosis not present

## 2023-06-28 DIAGNOSIS — N201 Calculus of ureter: Secondary | ICD-10-CM | POA: Diagnosis not present

## 2023-06-28 DIAGNOSIS — R3915 Urgency of urination: Secondary | ICD-10-CM | POA: Diagnosis not present

## 2023-06-28 DIAGNOSIS — R3912 Poor urinary stream: Secondary | ICD-10-CM | POA: Diagnosis not present

## 2023-06-28 MED ORDER — OXYCODONE-ACETAMINOPHEN 5-325 MG PO TABS
1.0000 | ORAL_TABLET | Freq: Four times a day (QID) | ORAL | 0 refills | Status: DC | PRN
Start: 2023-06-28 — End: 2023-07-03
  Filled 2023-06-28: qty 15, 4d supply, fill #0

## 2023-06-28 MED ORDER — HYOSCYAMINE SULFATE 0.125 MG SL SUBL
SUBLINGUAL_TABLET | SUBLINGUAL | 0 refills | Status: DC
  Filled 2023-06-28: qty 30, 8d supply, fill #0

## 2023-06-28 MED ORDER — KETOROLAC TROMETHAMINE 10 MG PO TABS
10.0000 mg | ORAL_TABLET | Freq: Three times a day (TID) | ORAL | 0 refills | Status: DC | PRN
  Filled 2023-06-28 – 2023-06-29 (×3): qty 15, 5d supply, fill #0

## 2023-06-29 ENCOUNTER — Other Ambulatory Visit (HOSPITAL_COMMUNITY): Payer: Self-pay

## 2023-07-03 ENCOUNTER — Other Ambulatory Visit: Payer: Self-pay

## 2023-07-03 ENCOUNTER — Other Ambulatory Visit: Payer: Self-pay | Admitting: Urology

## 2023-07-03 ENCOUNTER — Other Ambulatory Visit (HOSPITAL_COMMUNITY): Payer: Self-pay

## 2023-07-03 DIAGNOSIS — R1084 Generalized abdominal pain: Secondary | ICD-10-CM | POA: Diagnosis not present

## 2023-07-03 DIAGNOSIS — N201 Calculus of ureter: Secondary | ICD-10-CM | POA: Diagnosis not present

## 2023-07-03 MED ORDER — HYOSCYAMINE SULFATE 0.125 MG SL SUBL
0.1250 mg | SUBLINGUAL_TABLET | Freq: Four times a day (QID) | SUBLINGUAL | 0 refills | Status: DC | PRN
  Filled 2023-07-03 (×2): qty 30, 8d supply, fill #0

## 2023-07-03 MED ORDER — OXYCODONE-ACETAMINOPHEN 5-325 MG PO TABS
1.0000 | ORAL_TABLET | Freq: Four times a day (QID) | ORAL | 0 refills | Status: DC | PRN
  Filled 2023-07-03: qty 15, 4d supply, fill #0

## 2023-07-03 MED ORDER — KETOROLAC TROMETHAMINE 10 MG PO TABS
10.0000 mg | ORAL_TABLET | Freq: Three times a day (TID) | ORAL | 0 refills | Status: DC | PRN
  Filled 2023-07-03: qty 15, 5d supply, fill #0

## 2023-07-05 NOTE — Patient Instructions (Addendum)
 SURGICAL WAITING ROOM VISITATION  Patients having surgery or a procedure may have no more than 2 support people in the waiting area - these visitors may rotate.    Children under the age of 27 must have an adult with them who is not the patient.  Due to an increase in RSV and influenza rates and associated hospitalizations, children ages 26 and under may not visit patients in [facility] hospitals.  Visitors with respiratory illnesses are discouraged from visiting and should remain at home.  If the patient needs to stay at the hospital during part of their recovery, the visitor guidelines for inpatient rooms apply. Pre-op nurse will coordinate an appropriate time for 1 support person to accompany patient in pre-op.  This support person may not rotate.    Please refer to the [facility] website for the visitor guidelines for Inpatients (after your surgery is over and you are in a regular room).       Your procedure is scheduled on: 07-11-23    Report to [facility] Main Entrance    Report to admitting at      0830   AM   Call this number if you have problems the morning of surgery [phone]   Do not eat food  or drink liquids :After Midnight.                             If you have questions, please contact your surgeon's office.   FOLLOW ANY ADDITIONAL PRE OP INSTRUCTIONS YOU RECEIVED FROM YOUR SURGEON'S OFFICE!!!     Oral Hygiene is also important to reduce your risk of infection.                                    Remember - BRUSH YOUR TEETH THE MORNING OF SURGERY WITH YOUR REGULAR TOOTHPASTE  DENTURES WILL BE REMOVED PRIOR TO SURGERY PLEASE DO NOT APPLY "Poly grip" OR ADHESIVES!!!   Do NOT smoke after Midnight   Stop all vitamins and herbal supplements 7 days before surgery.   Take these medicines the morning of surgery with A SIP OF WATER: inhaler and bring rescue inhaler with you, nebulizer if needed, Buspar, omnicef, Adovart, flonase, omeprazole,  oxycodone or tylenol if needed, rosuvastatin  HOLD OZEMPIC 1 WEEK BEFORE SURGERY   .                              You may not have any metal on your body including hair pins, jewelry, and body piercing             Do not wear make-up, lotions, powders, perfumes/cologne, or deodorant  Do not wear nail polish including gel and S&S, artificial/acrylic nails, or any other type of covering on natural nails including finger and toenails. If you have artificial nails, gel coating, etc. that needs to be removed by a nail salon please have this removed prior to surgery or surgery may need to be canceled/ delayed if the surgeon/ anesthesia feels like they are unable to be safely monitored.   Do not shave  48 hours prior to surgery.      Do not bring valuables to the hospital. [place] IS NOT             RESPONSIBLE   FOR VALUABLES.  Contacts, glasses, dentures or bridgework may not be worn into surgery.   Bring small overnight bag day of surgery.   DO NOT BRING YOUR HOME MEDICATIONS TO THE HOSPITAL. PHARMACY WILL DISPENSE MEDICATIONS LISTED ON YOUR MEDICATION LIST TO YOU DURING YOUR ADMISSION IN THE HOSPITAL!    Patients discharged on the day of surgery will not be allowed to drive home.  Someone NEEDS to stay with you for the first 24 hours after anesthesia.   Special Instructions: Bring a copy of your healthcare power of attorney and living will documents the day of surgery if you haven't scanned them before.              Please read over the following fact sheets you were given: IF YOU HAVE QUESTIONS ABOUT YOUR PRE-OP INSTRUCTIONS PLEASE CALL [phone]    If you test positive for Covid or have been in contact with anyone that has tested positive in the last 10 days please notify you surgeon.    [facility] - Preparing for Surgery Before surgery, you can play an important role.  Because skin is not sterile, your skin needs to be as free of germs as possible.  You can reduce the  number of germs on your skin by washing with CHG (chlorahexidine gluconate) soap before surgery.  CHG is an antiseptic cleaner which kills germs and bonds with the skin to continue killing germs even after washing. Please DO NOT use if you have an allergy to CHG or antibacterial soaps.  If your skin becomes reddened/irritated stop using the CHG and inform your nurse when you arrive at Short Stay. Do not shave (including legs and underarms) for at least 48 hours prior to the first CHG shower.  You may shave your face/neck. Please follow these instructions carefully:  1.  Shower with CHG Soap the night before surgery and the  morning of Surgery.  2.  If you choose to wash your hair, wash your hair first as usual with your  normal  shampoo.  3.  After you shampoo, rinse your hair and body thoroughly to remove the  shampoo.                           4.  Use CHG as you would any other liquid soap.  You can apply chg directly  to the skin and wash                       Gently with a scrungie or clean washcloth.  5.  Apply the CHG Soap to your body ONLY FROM THE NECK DOWN.   Do not use on face/ open                           Wound or open sores. Avoid contact with eyes, ears mouth and genitals (private parts).                       Wash face,  Genitals (private parts) with your normal soap.             6.  Wash thoroughly, paying special attention to the area where your surgery  will be performed.  7.  Thoroughly rinse your body with warm water from the neck down.  8.  DO NOT shower/wash with your normal soap after using and rinsing off  the CHG Soap.  9.  Pat yourself dry with a clean towel.            10.  Wear clean pajamas.            11.  Place clean sheets on your bed the night of your first shower and do not  sleep with pets. Day of Surgery : Do not apply any lotions/deodorants the morning of surgery.  Please wear clean clothes to the hospital/surgery center.  FAILURE TO FOLLOW  THESE INSTRUCTIONS MAY RESULT IN THE CANCELLATION OF YOUR SURGERY PATIENT SIGNATURE_________________________________  NURSE SIGNATURE__________________________________  ________________________________________________________________________

## 2023-07-05 NOTE — Progress Notes (Addendum)
 PCP - Melina Schools, PA-C LOV 06-07-23 epic Cardiologist - no  PPM/ICD -  Device Orders -  Rep Notified -   Chest x-ray - 06-07-23 epic EKG - 04-16-23 epic Stress Test -  ECHO -  Cardiac Cath -  CBC/Diff, CMP- 06-25-23 epic  Sleep Study -  CPAP -   Fasting Blood Sugar -  Checks Blood Sugar __0___ times a day  Blood Thinner Instructions: Aspirin Instructions: 81mg  last dose 06-27-23  ERAS Protcol - PRE-SURGERY n/a  Ozempic HOLD 1 WEEK PRIOR- last dose 06-27-23  COVID vaccine -yes  Activity--Able to climb a flight of stairs without CP or SOB Anesthesia review: HTN, Asthma, DM2, pseudotumor, IIH  Patient denies shortness of breath, fever, cough and chest pain at PAT appointment   All instructions explained to the patient, with a verbal understanding of the material. Patient agrees to go over the instructions while at home for a better understanding. Patient also instructed to self quarantine after being tested for COVID-19. The opportunity to ask questions was provided.

## 2023-07-08 ENCOUNTER — Other Ambulatory Visit (HOSPITAL_COMMUNITY): Payer: Self-pay

## 2023-07-08 MED ORDER — OXYCODONE-ACETAMINOPHEN 5-325 MG PO TABS
1.0000 | ORAL_TABLET | Freq: Four times a day (QID) | ORAL | 0 refills | Status: DC | PRN
  Filled 2023-07-08: qty 15, 4d supply, fill #0

## 2023-07-08 MED ORDER — ONDANSETRON HCL 4 MG PO TABS
4.0000 mg | ORAL_TABLET | Freq: Three times a day (TID) | ORAL | 0 refills | Status: DC | PRN
  Filled 2023-07-08: qty 30, 10d supply, fill #0

## 2023-07-09 ENCOUNTER — Other Ambulatory Visit (HOSPITAL_COMMUNITY): Payer: Self-pay

## 2023-07-10 ENCOUNTER — Encounter (HOSPITAL_COMMUNITY)
Admission: RE | Admit: 2023-07-10 | Discharge: 2023-07-10 | Disposition: A | Discharge status: Home / Self Care | Source: Ambulatory Visit | Attending: Urology | Admitting: Urology

## 2023-07-10 ENCOUNTER — Other Ambulatory Visit: Payer: Self-pay

## 2023-07-10 ENCOUNTER — Encounter (HOSPITAL_COMMUNITY): Payer: Self-pay

## 2023-07-10 VITALS — BP 135/86 | HR 80 | Temp 98.4°F | Resp 16 | Ht 65.0 in | Wt 212.0 lb

## 2023-07-10 DIAGNOSIS — Z01812 Encounter for preprocedural laboratory examination: Secondary | ICD-10-CM | POA: Insufficient documentation

## 2023-07-10 DIAGNOSIS — I10 Essential (primary) hypertension: Secondary | ICD-10-CM | POA: Insufficient documentation

## 2023-07-10 DIAGNOSIS — E119 Type 2 diabetes mellitus without complications: Secondary | ICD-10-CM | POA: Diagnosis not present

## 2023-07-10 HISTORY — DX: Anxiety disorder, unspecified: F41.9

## 2023-07-10 HISTORY — DX: Personal history of other diseases of the digestive system: Z87.19

## 2023-07-10 HISTORY — DX: Personal history of urinary calculi: Z87.442

## 2023-07-10 HISTORY — DX: Gastro-esophageal reflux disease without esophagitis: K21.9

## 2023-07-10 HISTORY — DX: Unspecified osteoarthritis, unspecified site: M19.90

## 2023-07-10 HISTORY — DX: Headache, unspecified: R51.9

## 2023-07-10 LAB — CBC
HCT: 33.9 % — ABNORMAL LOW (ref 36.0–46.0)
Hemoglobin: 10.6 g/dL — ABNORMAL LOW (ref 12.0–15.0)
MCH: 29.8 pg (ref 26.0–34.0)
MCHC: 31.3 g/dL (ref 30.0–36.0)
MCV: 95.2 fL (ref 80.0–100.0)
Platelets: 385 10*3/uL (ref 150–400)
RBC: 3.56 MIL/uL — ABNORMAL LOW (ref 3.87–5.11)
RDW: 14.6 % (ref 11.5–15.5)
WBC: 9.4 10*3/uL (ref 4.0–10.5)
nRBC: 0 % (ref 0.0–0.2)

## 2023-07-10 LAB — BASIC METABOLIC PANEL
Anion gap: 7 (ref 5–15)
BUN: 15 mg/dL (ref 6–20)
CO2: 17 mmol/L — ABNORMAL LOW (ref 22–32)
Calcium: 9 mg/dL (ref 8.9–10.3)
Chloride: 113 mmol/L — ABNORMAL HIGH (ref 98–111)
Creatinine, Ser: 0.83 mg/dL (ref 0.44–1.00)
GFR, Estimated: >60 mL/min (ref >60)
Glucose, Bld: 204 mg/dL — ABNORMAL HIGH (ref 70–99)
Potassium: 4.2 mmol/L (ref 3.5–5.1)
Sodium: 137 mmol/L (ref 135–145)

## 2023-07-10 LAB — GLUCOSE, CAPILLARY: Glucose-Capillary: 226 mg/dL — ABNORMAL HIGH (ref 70–99)

## 2023-07-10 LAB — HEMOGLOBIN A1C
Hgb A1c MFr Bld: 7.6 % — ABNORMAL HIGH (ref 4.8–5.6)
Mean Plasma Glucose: 171.42 mg/dL

## 2023-07-10 NOTE — H&P (Signed)
 CC/HPI: cc: Urolithiasis   06/26/2023: 53 year old woman with a history of urolithiasis who developed acute onset nausea and vomiting as well as right-sided flank pain yesterday and went to the ED. CT showed a 5 mm distal right UVJ calculus. Patient has not responded to Toradol today. She continues to have significant pain as well as nausea and vomiting. She is on little work like this.   06/28/2023: Patient underwent urgent ureteral stenting with Dr. Arita Miss at the time of last exam. She has pending ureteroscopy for definitive stone management. Added on acutely today with refractory stent pain and discomfort. From prior ED visit as well as from Dr. Arita Miss the patient has current prescriptions for Bactrim, Flomax, ondansetron, oxycodone, ODT hyoscyamine as well as oral ketorolac.   Today she states she only gets relief for about 2 hours after taking Toradol. She has not been taking oxycodone with any sort of consistency. Also not taking the hyoscyamine. She does feel that Flomax is helpful. Also continuing on Bactrim. Denies fevers or chills, nausea or vomiting. She has mostly pain and discomfort overlying the suprapubic area with radiation into the right lower quadrant of the abdomen. Also with associated irritative and obstructive voiding symptoms including urgency, some hesitancy and intermittency of stream with dysuria. She has not had significant gross hematuria. She does have a mildly elevated residual today. UA with expected microscopic hematuria but no acute concerns for underlying infectious process. Urine culture has been sent.   07/03/2023: 53 year old female who presents for follow-up after she underwent urgent ureteral right stent placement due to obstructing right ureteral stone. She is having a lot of irritative voiding symptoms as well as pelvic pressure. She denies fevers. She is using the medication prescribed since her last evaluation her pain has improved.     ALLERGIES: seasonal allergies     MEDICATIONS: Ketorolac Tromethamine 10 mg tablet 1 tablet PO Q 8 H PRN  Omeprazole  Acetazolamide Er 500 mg capsule, extended release  Albuterol Sulfate  Amlodipine Besilate  Aspirin Ec 81 mg tablet, delayed release  Benzonatate 100 mg capsule  Buspirone Hcl 15 mg tablet  Chlorpheniramine Maleate  Cyclobenzaprine Hcl  Dutasteride 0.5 mg capsule  Flomax 0.4 mg capsule  Fluticasone Propionate  Humira 40 mg/0.8 ml syringe kit  Hydrocodone-Acetaminophen 5 mg-325 mg tablet  Hyoscyamine Sulfate 0.125 mg tablet, sublingual 1 tablet Sublingual Q 6 H PRN Take every 6 hours as needed for bladder pain and urgency  Lorazepam 1 mg tablet  Losartan Potassium  Ondansetron Hcl  Ozempic 2 mg/0.75 ml (8 mg/3 ml) pen injector  Rosuvastatin Calcium  Singulair  Symbicort 160 mcg-4.5 mcg/actuation hfa aerosol with adapter  Tylenol  Xyzal 5 mg tablet     GU PSH: No GU PSH    NON-GU PSH: Bilateral Tubal Ligation - about 2000 Brain surgery - about 06/22/2022 Hand/finger Surgery - about 2014 Hip Replacement - about 2016 Remove Gallbladder - about 2000 Treat Ectopic Pregnancy Visit Complexity (formerly GPC1X) - 06/28/2023 Wrist Arthroscopy - about 2015     GU PMH: Flank Pain - 06/28/2023, - 06/26/2023 Ureteral calculus - 06/28/2023, - 06/26/2023 Urinary Urgency - 06/28/2023 Weak Urinary Stream - 06/28/2023      PMH Notes: Kidney stones   NON-GU PMH: Anxiety Arthritis Asthma Diabetes Type 2 GERD Hypertension    FAMILY HISTORY: Kidney Stones - Uncle Prostate Cancer - Brother   SOCIAL HISTORY: Marital Status: Married Preferred Language: English; Ethnicity: Not Hispanic Or Latino; Race: White Current Smoking Status: Patient does not  smoke anymore.   Tobacco Use Assessment Completed: Used Tobacco in last 30 days? Does drink.  Drinks 1 caffeinated drink per day.    REVIEW OF SYSTEMS:    GU Review Female:   Patient reports frequent urination, hard to postpone urination, burning /pain with  urination, get up at night to urinate, leakage of urine, stream starts and stops, and have to strain to urinate. Patient denies trouble starting your stream and being pregnant.  Gastrointestinal (Upper):   Patient reports nausea. Patient denies vomiting and indigestion/ heartburn.  Gastrointestinal (Lower):   Patient denies diarrhea and constipation.  Constitutional:   Patient reports fatigue. Patient denies fever, night sweats, and weight loss.  Skin:   Patient denies skin rash/ lesion and itching.  Eyes:   Patient denies blurred vision and double vision.  Cardiovascular:   Patient denies leg swelling and chest pains.  Musculoskeletal:   Patient denies joint pain and back pain.  Neurological:   Patient denies headaches and dizziness.  Psychologic:   Patient denies depression and anxiety.   VITAL SIGNS:      07/03/2023 12:47 PM  BP 114/73 mmHg  Pulse 96 /min  Temperature 97.9 F / 36.6 C   MULTI-SYSTEM PHYSICAL EXAMINATION:    Constitutional: Well-nourished. No physical deformities. Normally developed. Good grooming.  Cardiovascular: Normal temperature, normal extremity pulses, no swelling, no varicosities.  Skin: No paleness, no jaundice, no cyanosis. No lesion, no ulcer, no rash.  Neurologic / Psychiatric: Oriented to time, oriented to place, oriented to person. No depression, no anxiety, no agitation.  Gastrointestinal: No mass, no tenderness, no rigidity, non obese abdomen.     Complexity of Data:  Source Of History:  Patient  Records Review:   Previous Doctor Records, Previous Patient Records  Urine Test Review:   Urinalysis   07/03/23  Urinalysis  Urine Appearance Turbid   Urine Color Red   Urine Glucose Invalid mg/dL  Urine Bilirubin Invalid mg/dL  Urine Ketones Invalid mg/dL  Urine Specific Gravity Invalid   Urine Blood Invalid ery/uL  Urine pH Invalid   Urine Protein Invalid mg/dL  Urine Urobilinogen Invalid mg/dL  Urine Nitrites Invalid   Urine Leukocyte Esterase  Invalid leu/uL  Urine WBC/hpf 0 - 5/hpf   Urine RBC/hpf >60/hpf   Urine Epithelial Cells 0 - 5/hpf   Urine Bacteria Few (10-25/hpf)   Urine Mucous Not Present   Urine Yeast NS (Not Seen)   Urine Trichomonas Not Present   Urine Cystals Ca Oxalate   Urine Casts NS (Not Seen)   Urine Sperm Not Present    PROCEDURES:          Visit Complexity - G2211          Urinalysis w/Scope Dipstick Dipstick Cont'd Micro  Color: Red Bilirubin: Invalid mg/dL WBC/hpf: 0 - 5/hpf  Appearance: Turbid Ketones: Invalid mg/dL RBC/hpf: >40/JWJ  Specific Gravity: Invalid Blood: Invalid ery/uL Bacteria: Few (10-25/hpf)  pH: Invalid Protein: Invalid mg/dL Cystals: Ca Oxalate  Glucose: Invalid mg/dL Urobilinogen: Invalid mg/dL Casts: NS (Not Seen)    Nitrites: Invalid Trichomonas: Not Present    Leukocyte Esterase: Invalid leu/uL Mucous: Not Present      Epithelial Cells: 0 - 5/hpf      Yeast: NS (Not Seen)      Sperm: Not Present    Notes: too bloody to dip or spin          Ketoralac 30mg  - 96372, [id number] 30 mg was given and zero was wasted.   Qty:  30 Adm. By: Andree Moro  Unit: mg Lot No [id number]  Route: IM Exp. Date 02/15/2024  Freq: None Mfgr.:   Site: Right Hip   ASSESSMENT:      ICD-10 Details  1 GU:   Flank Pain - R10.84 Right, Acute, Systemic Symptoms  2   Ureteral calculus - N20.1 Right, Acute, Systemic Symptoms   PLAN:            Medications New Meds: Oxycodone-Acetaminophen 5 mg-325 mg tablet 1 tablet PO Q 6 H PRN   #15  0 Refill(s)  Pharmacy Name:  [facility]  Address:  [address]   [place], [place] [address]  Phone:  [phone]  Fax:  [phone]    Refill Meds: Ketorolac Tromethamine 10 mg tablet 1 tablet PO Q 8 H PRN   #15  0 Refill(s)  Hyoscyamine Sulfate 0.125 mg tablet, sublingual 1 tablet Sublingual Q 6 H PRN Take every 6 hours as needed for bladder pain and urgency  #30  0 Refill(s)            Schedule Procedure:  07/03/2023 at [facility] - [phone] - Ketoralac 30mg  (Toradol Per 15 Mg) - G4010, [id number]          Document Letter(s):  Created for Patient: Clinical Summary         Notes:   Urine will be sent for precautionary culture. Discussed use of a heating pad and elevation when sleeping. She was also given an IM injection of Toradol for acute pain today. She will continue with her medication regimen. I will refill her meds. I have placed a green sheet to get her on for surgery on Thursday 07/11/23. She understands not to take her Ozempic for the next week until after her surgery.

## 2023-07-11 ENCOUNTER — Ambulatory Visit (HOSPITAL_COMMUNITY): Admitting: Anesthesiology

## 2023-07-11 ENCOUNTER — Encounter (HOSPITAL_COMMUNITY)
Admission: RE | Disposition: A | Discharge status: Home / Self Care | Payer: Self-pay | Source: Home / Self Care | Attending: Urology

## 2023-07-11 ENCOUNTER — Ambulatory Visit (HOSPITAL_COMMUNITY)
Admission: RE | Admit: 2023-07-11 | Discharge: 2023-07-11 | Disposition: A | Discharge status: Home / Self Care | Attending: Urology | Admitting: Urology

## 2023-07-11 ENCOUNTER — Ambulatory Visit (HOSPITAL_COMMUNITY)

## 2023-07-11 ENCOUNTER — Other Ambulatory Visit: Payer: Self-pay | Admitting: Medical

## 2023-07-11 ENCOUNTER — Ambulatory Visit (HOSPITAL_BASED_OUTPATIENT_CLINIC_OR_DEPARTMENT_OTHER): Admitting: Anesthesiology

## 2023-07-11 ENCOUNTER — Encounter (HOSPITAL_COMMUNITY): Payer: Self-pay | Admitting: Urology

## 2023-07-11 DIAGNOSIS — E119 Type 2 diabetes mellitus without complications: Secondary | ICD-10-CM

## 2023-07-11 DIAGNOSIS — N201 Calculus of ureter: Secondary | ICD-10-CM

## 2023-07-11 HISTORY — PX: CYSTOSCOPY/URETEROSCOPY/HOLMIUM LASER/STENT PLACEMENT: SHX6546

## 2023-07-11 LAB — GLUCOSE, CAPILLARY: Glucose-Capillary: 119 mg/dL — ABNORMAL HIGH (ref 70–99)

## 2023-07-11 LAB — POCT PREGNANCY, URINE: Preg Test, Ur: NEGATIVE

## 2023-07-11 SURGERY — CYSTOSCOPY/URETEROSCOPY/HOLMIUM LASER/STENT PLACEMENT
Anesthesia: General | Laterality: Right

## 2023-07-11 MED ORDER — DEXAMETHASONE SODIUM PHOSPHATE 4 MG/ML IJ SOLN
INTRAMUSCULAR | Status: DC | PRN
  Administered 2023-07-11: 10 mg via INTRAVENOUS

## 2023-07-11 MED ORDER — FENTANYL CITRATE (PF) 100 MCG/2ML IJ SOLN
INTRAMUSCULAR | Status: AC
  Filled 2023-07-11: qty 2

## 2023-07-11 MED ORDER — PHENYLEPHRINE 80 MCG/ML (10ML) SYRINGE FOR IV PUSH (FOR BLOOD PRESSURE SUPPORT)
PREFILLED_SYRINGE | INTRAVENOUS | Status: DC | PRN
  Administered 2023-07-11: 160 ug via INTRAVENOUS
  Administered 2023-07-11: 120 ug via INTRAVENOUS
  Administered 2023-07-11: 160 ug via INTRAVENOUS

## 2023-07-11 MED ORDER — PROPOFOL 10 MG/ML IV BOLUS
INTRAVENOUS | Status: AC
  Filled 2023-07-11: qty 20

## 2023-07-11 MED ORDER — ONDANSETRON HCL 4 MG/2ML IJ SOLN
INTRAMUSCULAR | Status: DC | PRN
  Administered 2023-07-11: 4 mg via INTRAVENOUS

## 2023-07-11 MED ORDER — MIDAZOLAM HCL 2 MG/2ML IJ SOLN
INTRAMUSCULAR | Status: AC
  Filled 2023-07-11: qty 2

## 2023-07-11 MED ORDER — INSULIN ASPART 100 UNIT/ML IJ SOLN: 0.0000 [IU] | INTRAMUSCULAR | Status: DC | PRN

## 2023-07-11 MED ORDER — OXYCODONE HCL 5 MG/5ML PO SOLN: 5.0000 mg | Freq: Once | ORAL | Status: DC | PRN

## 2023-07-11 MED ORDER — ORAL CARE MOUTH RINSE: 15.0000 mL | Freq: Once | OROMUCOSAL | Status: AC

## 2023-07-11 MED ORDER — FENTANYL CITRATE (PF) 100 MCG/2ML IJ SOLN
INTRAMUSCULAR | Status: DC | PRN
Start: 2023-07-11 — End: 2023-07-11
  Administered 2023-07-11 (×2): 25 ug via INTRAVENOUS
  Administered 2023-07-11: 50 ug via INTRAVENOUS

## 2023-07-11 MED ORDER — ONDANSETRON HCL 4 MG/2ML IJ SOLN
INTRAMUSCULAR | Status: AC
  Filled 2023-07-11: qty 2

## 2023-07-11 MED ORDER — OXYCODONE HCL 5 MG PO TABS: 5.0000 mg | ORAL_TABLET | Freq: Once | ORAL | Status: DC | PRN

## 2023-07-11 MED ORDER — SODIUM CHLORIDE 0.9 % IR SOLN
Status: DC | PRN
  Administered 2023-07-11: 3000 mL via INTRAVESICAL

## 2023-07-11 MED ORDER — FENTANYL CITRATE PF 50 MCG/ML IJ SOSY: 25.0000 ug | PREFILLED_SYRINGE | INTRAMUSCULAR | Status: DC | PRN

## 2023-07-11 MED ORDER — LACTATED RINGERS IV SOLN: INTRAVENOUS | Status: DC

## 2023-07-11 MED ORDER — LIDOCAINE HCL (PF) 2 % IJ SOLN
INTRAMUSCULAR | Status: DC | PRN
  Administered 2023-07-11: 60 mg via INTRADERMAL

## 2023-07-11 MED ORDER — MIDAZOLAM HCL 2 MG/2ML IJ SOLN
INTRAMUSCULAR | Status: DC | PRN
  Administered 2023-07-11: 2 mg via INTRAVENOUS

## 2023-07-11 MED ORDER — ONDANSETRON HCL 4 MG/2ML IJ SOLN: 4.0000 mg | Freq: Four times a day (QID) | INTRAMUSCULAR | Status: DC | PRN

## 2023-07-11 MED ORDER — DEXAMETHASONE SODIUM PHOSPHATE 10 MG/ML IJ SOLN
INTRAMUSCULAR | Status: AC
  Filled 2023-07-11: qty 1

## 2023-07-11 MED ORDER — PROPOFOL 10 MG/ML IV BOLUS
INTRAVENOUS | Status: DC | PRN
  Administered 2023-07-11: 150 mg via INTRAVENOUS

## 2023-07-11 MED ORDER — CHLORHEXIDINE GLUCONATE 0.12 % MT SOLN
15.0000 mL | Freq: Once | OROMUCOSAL | Status: AC
  Administered 2023-07-11: 15 mL via OROMUCOSAL

## 2023-07-11 MED ORDER — CEFAZOLIN SODIUM-DEXTROSE 2-4 GM/100ML-% IV SOLN
2.0000 g | INTRAVENOUS | Status: AC
  Administered 2023-07-11: 2 g via INTRAVENOUS
  Filled 2023-07-11: qty 100

## 2023-07-11 SURGICAL SUPPLY — 20 items
BAG URO CATCHER STRL LF (MISCELLANEOUS) ×1 IMPLANT
BASKET ZERO TIP NITINOL 2.4FR (BASKET) IMPLANT
CATH URETL OPEN 5X70 (CATHETERS) ×1 IMPLANT
CLOTH BEACON ORANGE TIMEOUT ST (SAFETY) ×1 IMPLANT
DRSG TEGADERM 2-3/8X2-3/4 SM (GAUZE/BANDAGES/DRESSINGS) IMPLANT
EXTRACTOR STONE 1.7FRX115CM (UROLOGICAL SUPPLIES) IMPLANT
FIBER LASER MOSES 200 DFL (Laser) IMPLANT
FIBER LASER MOSES 365 DFL (Laser) IMPLANT
GLOVE BIO SURGEON STRL SZ 6.5 (GLOVE) ×1 IMPLANT
GOWN STRL REUS W/ TWL LRG LVL3 (GOWN DISPOSABLE) ×1 IMPLANT
GUIDEWIRE STR DUAL SENSOR (WIRE) ×1 IMPLANT
KIT TURNOVER KIT A (KITS) IMPLANT
LASER FIB FLEXIVA PULSE ID 365 (Laser) IMPLANT
MANIFOLD NEPTUNE II (INSTRUMENTS) ×1 IMPLANT
PACK CYSTO (CUSTOM PROCEDURE TRAY) ×1 IMPLANT
SHEATH NAVIGATOR HD 11/13X28 (SHEATH) IMPLANT
SHEATH NAVIGATOR HD 11/13X36 (SHEATH) IMPLANT
TRACTIP FLEXIVA PULS ID 200XHI (Laser) IMPLANT
TUBING CONNECTING 10 (TUBING) ×1 IMPLANT
TUBING UROLOGY SET (TUBING) ×1 IMPLANT

## 2023-07-11 NOTE — Anesthesia Preprocedure Evaluation (Signed)
 Anesthesia Evaluation  Patient identified by MRN, date of birth, ID band Patient awake    Reviewed: Allergy & Precautions, H&P , NPO status , Patient's Chart, lab work & pertinent test results  Airway Mallampati: II   Neck ROM: full    Dental   Pulmonary asthma , former smoker   breath sounds clear to auscultation       Cardiovascular hypertension,  Rhythm:regular Rate:Normal     Neuro/Psych  Headaches PSYCHIATRIC DISORDERS Anxiety        GI/Hepatic ,GERD  ,,  Endo/Other  diabetes, Type 2    Renal/GU      Musculoskeletal  (+) Arthritis ,    Abdominal   Peds  Hematology   Anesthesia Other Findings   Reproductive/Obstetrics                             Anesthesia Physical Anesthesia Plan  ASA: 2  Anesthesia Plan: General   Post-op Pain Management:    Induction: Intravenous  PONV Risk Score and Plan: 3 and Ondansetron, Dexamethasone, Midazolam and Treatment may vary due to age or medical condition  Airway Management Planned: LMA  Additional Equipment:   Intra-op Plan:   Post-operative Plan: Extubation in OR  Informed Consent: I have reviewed the patients History and Physical, chart, labs and discussed the procedure including the risks, benefits and alternatives for the proposed anesthesia with the patient or authorized representative who has indicated his/her understanding and acceptance.     Dental advisory given  Plan Discussed with: CRNA, Anesthesiologist and Surgeon  Anesthesia Plan Comments:        Anesthesia Quick Evaluation

## 2023-07-11 NOTE — Transfer of Care (Signed)
 Immediate Anesthesia Transfer of Care Note  Patient: Theresa Carter  Procedure(s) Performed: CYSTOSCOPY/URETEROSCOPY, REMOVAL OF STONE BASKETING (Right)  Patient Location: PACU  Anesthesia Type:General  Level of Consciousness: awake and patient cooperative  Airway & Oxygen Therapy: Patient Spontanous Breathing and Patient connected to face mask  Post-op Assessment: Report given to RN and Post -op Vital signs reviewed and stable  Post vital signs: Reviewed and stable  Last Vitals:  Vitals Value Taken Time  BP    Temp    Pulse 81 07/11/23 1150  Resp 14 07/11/23 1150  SpO2 100 % 07/11/23 1150  Vitals shown include unfiled device data.  Last Pain:  Vitals:   07/11/23 0953  TempSrc:   PainSc: 4          Complications: No notable events documented.

## 2023-07-11 NOTE — Interval H&P Note (Signed)
 History and Physical Interval Note:  07/11/2023 9:44 AM  Theresa Carter  has presented today for surgery, with the diagnosis of RIGHT URETERAL CALCULUS.  The various methods of treatment have been discussed with the patient and family. After consideration of risks, benefits and other options for treatment, the patient has consented to  Procedure(s) with comments: CYSTOSCOPY/URETEROSCOPY/HOLMIUM LASER/STENT PLACEMENT (Right) - CYSTOSCOPY/URETEROSCOPY/HOLMIUM LASER/STENT EXCHANGE as a surgical intervention.  The patient's history has been reviewed, patient examined, no change in status, stable for surgery.  I have reviewed the patient's chart and labs.  Questions were answered to the patient's satisfaction.     Illeana Edick D Kyley Solow

## 2023-07-11 NOTE — Op Note (Signed)
 Preoperative diagnosis: right ureteral calculus  Postoperative diagnosis: right ureteral calculus  Procedure:  Cystoscopy right ureteroscopy with basket stone extraction Removal of right ureteral stent  Surgeon: Kasandra Knudsen, MD  Anesthesia: General  Complications: None  Intraoperative findings:  Normal urethra Bilateral orthotropic ureteral orifices Bladder mucosa normal without masses   EBL: Minimal  Specimens: none  Disposition of specimens: [facility] for stone analysis  Indication: Theresa Carter is a 53 y.o.   patient with a 5mm right ureteral stone and associated right symptoms who previously underwent urgent right ureteral stent placement. After reviewing the management options for treatment, the patient elected to proceed with the above surgical procedure(s). We have discussed the potential benefits and risks of the procedure, side effects of the proposed treatment, the likelihood of the patient achieving the goals of the procedure, and any potential problems that might occur during the procedure or recuperation. Informed consent has been obtained.   Description of procedure:  The patient was taken to the operating room and general anesthesia was induced.  The patient was placed in the dorsal lithotomy position, prepped and draped in the usual sterile fashion, and preoperative antibiotics were administered. A preoperative time-out was performed.   Cystourethroscopy was performed.  The patient's urethra was examined and was normal. The bladder was then systematically examined in its entirety. There was no evidence for any bladder tumors, stones, or other mucosal pathology.    Attention then turned to the right ureteral orifice graspers were used to bring the existing ureteral stent to the urethral meatus.  A 0.38 sensor wire was advanced to the ureteral stent and advanced to the kidney with fluoroscopic guidance.  The stent was then discarded.   Semirigid ureteroscopy took place alongside the wire and the previously identified stone on CT scan was encountered in the distal ureter.  A 0 tip basket was used to basket the stone and remove it.  It was soft and broke apart to sand when trying to put in specimen cup.  Semirigid ureteroscopy continued up to the UPJ.  There were no other stones encountered.  The ureteroscope was carefully removed looking for additional calculi which were not seen.  There is no damage to the ureter on the way out and the ureter was wide open all the way to the bladder.  The decision was made to not leave a stent.    The bladder was then emptied and the procedure ended.  The patient appeared to tolerate the procedure well and without complications.  The patient was able to be awakened and transferred to the recovery unit in satisfactory condition.   Disposition: Follow up in the office

## 2023-07-11 NOTE — Discharge Instructions (Addendum)
 DISCHARGE INSTRUCTIONS FOR KIDNEY STONE/URETERAL STENT   MEDICATIONS:  1. Resume all your other meds from home  2. AZO over the counter can help with the burning/stinging when you urinate.   ACTIVITY:  1. No strenuous activity x 1week  2. No driving while on narcotic pain medications  3. Drink plenty of water  4. Continue to walk at home - you can still get blood clots when you are at home, so keep active, but don't over do it.  5. May return to work/school tomorrow or when you feel ready   BATHING:  1. You can shower and we recommend daily showers     SIGNS/SYMPTOMS TO CALL:  Please call us if you have a fever greater than 101.5, uncontrolled nausea/vomiting, uncontrolled pain, dizziness, unable to urinate, bloody urine, chest pain, shortness of breath, leg swelling, leg pain, redness around wound, drainage from wound, or any other concerns or questions.   You can reach [place] at [phone].   FOLLOW-UP:  1. You have follow scheduled in the office

## 2023-07-11 NOTE — Anesthesia Procedure Notes (Signed)
 Procedure Name: LMA Insertion Date/Time: 07/11/2023 11:20 AM  Performed by: Vanessa Frost, CRNAPre-anesthesia Checklist: Patient identified, Patient being monitored, Emergency Drugs available and Suction available Patient Re-evaluated:Patient Re-evaluated prior to induction Oxygen Delivery Method: Circle system utilized Preoxygenation: Pre-oxygenation with 100% oxygen Induction Type: IV induction Ventilation: Mask ventilation without difficulty LMA: LMA inserted LMA Size: 4.0 Number of attempts: 1 Placement Confirmation: positive ETCO2 and breath sounds checked- equal and bilateral Tube secured with: Tape Dental Injury: Teeth and Oropharynx as per pre-operative assessment

## 2023-07-12 ENCOUNTER — Encounter (HOSPITAL_COMMUNITY): Payer: Self-pay | Admitting: Urology

## 2023-07-12 NOTE — Anesthesia Postprocedure Evaluation (Signed)
 Anesthesia Post Note  Patient: Theresa Carter  Procedure(s) Performed: CYSTOSCOPY/URETEROSCOPY, REMOVAL OF STONE BASKETING (Right)     Patient location during evaluation: PACU Anesthesia Type: General Level of consciousness: awake and alert Pain management: pain level controlled Vital Signs Assessment: post-procedure vital signs reviewed and stable Respiratory status: spontaneous breathing, nonlabored ventilation, respiratory function stable and patient connected to nasal cannula oxygen Cardiovascular status: blood pressure returned to baseline and stable Postop Assessment: no apparent nausea or vomiting Anesthetic complications: no   No notable events documented.  Last Vitals:  Vitals:   07/11/23 1200 07/11/23 1215  BP: 118/71 114/67  Pulse: 79 69  Resp: 16 17  Temp:    SpO2: 100% 99%    Last Pain:  Vitals:   07/11/23 1225  TempSrc:   PainSc: 0-No pain                 Kieran Nachtigal S

## 2023-07-24 DIAGNOSIS — N130 Hydronephrosis with ureteropelvic junction obstruction: Secondary | ICD-10-CM | POA: Diagnosis not present

## 2023-07-24 DIAGNOSIS — N201 Calculus of ureter: Secondary | ICD-10-CM | POA: Diagnosis not present

## 2023-07-29 ENCOUNTER — Encounter: Payer: Self-pay | Admitting: Medical

## 2023-07-30 ENCOUNTER — Other Ambulatory Visit: Payer: Self-pay | Admitting: Medical

## 2023-07-30 MED ORDER — SYMBICORT 160-4.5 MCG/ACT IN AERO: 2.0000 | INHALATION_SPRAY | Freq: Two times a day (BID) | RESPIRATORY_TRACT | 3 refills | Status: DC

## 2023-07-31 MED ORDER — FLUTICASONE PROPIONATE 50 MCG/ACT NA SUSP: 2.0000 | Freq: Every day | NASAL | 6 refills | Status: DC

## 2023-07-31 NOTE — Addendum Note (Signed)
 Addended by: Maximino Sarin on: 07/31/2023 08:33 AM   Modules accepted: Orders

## 2023-08-07 ENCOUNTER — Other Ambulatory Visit: Payer: Self-pay | Admitting: Medical

## 2023-08-13 ENCOUNTER — Other Ambulatory Visit: Payer: Self-pay | Admitting: Medical

## 2023-08-15 ENCOUNTER — Encounter: Payer: Self-pay | Admitting: Pharmacist

## 2023-08-15 ENCOUNTER — Encounter: Payer: Self-pay | Admitting: Medical

## 2023-08-15 NOTE — Progress Notes (Signed)
 08/15/2023 Name: Theresa Carter MRN: [id number] DOB: 1970-09-12  Chief Complaint  Patient presents with   Medication Management    Theresa Carter is a 53 y.o. year old female. Coordinated with medication assistance program with [employer].    They were referred to the pharmacist by their PCP for assistance in managing medication access.    Subjective: She is now enrolled in a Medicare part D plan as of January 2025.   Patient is enrolled in enrolled in [employer] medication assistance program to received Ozempic  however she reports she has 1 pen remaining and has not received next shipment yet.      For generic medications she usually uses [facility] because they will work with her or use GoodRx discount. She also has been provided with [facility] medication cost to compare to current pharmacy costs.    Medication Access/Adherence  Current Pharmacy:  [facility] [id number] - [address] [place] - [address] [place] [address] Phone: [phone] Fax: [phone]  [id number] - [place], [place] - [address] AT [facility] [address] [place] [address] Phone: [phone] Fax: [phone]   Patient reports affordability concerns with their medications: Yes  Patient reports access/transportation concerns to their pharmacy: No  Patient reports adherence concerns with their medications:  Yes  due to cost    Objective:  Lab Results  Component Value Date   HGBA1C 7.6 (H) 07/10/2023    Lab Results  Component Value Date   CREATININE 0.83 07/10/2023   BUN 15 07/10/2023   NA 137 07/10/2023   K 4.2 07/10/2023   CL 113 (H) 07/10/2023   CO2 17 (L) 07/10/2023    Lab Results  Component Value Date   CHOL 216 (H) 01/19/2021   HDL 39 (L) 01/19/2021   LDLCALC 123 (H) 01/19/2021   TRIG 305 (H) 01/19/2021   CHOLHDL 5.5 (H) 01/19/2021    Medications Reviewed  Today     Reviewed by Cecilie Coffee, RPH-CPP (Pharmacist) on 08/15/23 at 1123  Med List Status: <None>   Medication Order Taking? Sig Documenting Provider Last Dose Status Informant  acetaminophen  (TYLENOL ) 500 MG tablet [id number]  Take 1,000 mg by mouth every 6 (six) hours as needed for mild pain. [provider]  Active Self  acetaZOLAMIDE ER (DIAMOX) 500 MG capsule [id number]  Take 500 mg by mouth 2 (two) times daily. [provider]  Active   Adalimumab (HUMIRA PEN) 40 MG/0.4ML PNKT [id number]  Inject 40 mg into the skin once a week. Tuesday [provider]  Active Self           Med Note Alida Ion, Jaquita Merl   Tue Mar 05, 2023 10:44 AM) [facility]  albuterol  (VENTOLIN  HFA) [phone] Base) MCG/ACT inhaler [id number]  Inhale 2 puffs into the lungs every 6 (six) hours as needed for wheezing or shortness of breath. Saguier, Gaylin Ke, PA-C  Active Self  amLODipine  (NORVASC ) 5 MG tablet [id number]  TAKE 1 TABLET BY MOUTH DAILY Saguier, Edward, PA-C  Active   aspirin  EC 81 MG tablet [id number]  Take 81 mg by mouth daily. Swallow whole. [provider]  Active   benzonatate  (TESSALON ) 100 MG capsule [id number]  Take 1 capsule (100 mg total) by mouth 3 (three) times daily as needed for cough.  Patient not taking: Reported on 07/04/2023   Saguier, Edward, PA-C  Active Self  Blood Glucose  Monitoring Suppl [facility] VERIO) w/Device KIT [id number]  1 kit by Does not apply route daily. Check blood glucose once daily as needed Lillia Reilly, MD  Active Self  busPIRone  (BUSPAR ) 15 MG tablet [id number]  TAKE 1 TABLET BY MOUTH 2 TIMES A DAY Saguier, Edward, PA-C  Active   cefdinir (OMNICEF) 300 MG capsule [id number]  Take 300 mg by mouth 2 (two) times daily. [provider]  Active   cyclobenzaprine  (FLEXERIL ) 5 MG tablet [id number]  TAKE 1 TABLET BY MOUTH EVERY NIGHT AT BEDTIME AS NEEDED FOR NECK PAIN Saguier, Gaylin Ke, PA-C  Active   dutasteride (AVODART) 0.5 MG  capsule [id number]  Take one tablet daily on week days (Monday-Friday). Do not take if pregnant or trying to get pregnant. [provider]  Active Self  fluticasone  (FLONASE ) 50 MCG/ACT nasal spray [id number]  SPRAY 2 SPRAYS IN EACH NOSTRIL ONCE DAILY Saguier, Gaylin Ke, PA-C  Active   fluticasone  (FLONASE ) 50 MCG/ACT nasal spray [id number]  Place 2 sprays into both nostrils daily. Saguier, Gaylin Ke, PA-C  Active   glucose blood [facility] VERIO) test strip [id number]  Check blood glucose once daily as needed Lillia Reilly, MD  Active Self  hyoscyamine  (ANASPAZ ) 0.125 MG TBDP disintergrating tablet [id number]  Place 1 tablet (0.125 mg total) under the tongue every 6 (six) hours as needed.  Patient not taking: Reported on 07/04/2023   Pace, Maryellen D, MD  Active Self  hyoscyamine  (LEVSIN SL) 0.125 MG SL tablet [id number]  Take 1 tablet (0.125 mg total) by mouth every 6 (six) hours as needed for bladder pain and urgency   Active Self  Insulin  Pen Needle (BD PEN NEEDLE NANO 2ND GEN) 32G X 4 MM MISC [id number]  USE TO INJECT DIABETIC MEDICATION INTO THE SKIN 1 TIME DAILY Sridharan, Sriramkumar, MD  Active Self  ipratropium-albuterol  (DUONEB) 0.5-2.5 (3) MG/3ML SOLN [id number]  Take 3 mLs by nebulization every 4 (four) hours as needed. Saguier, Gaylin Ke, PA-C  Active Self  ketorolac  (TORADOL ) 10 MG tablet [id number] Yes Take 1 tablet (10 mg total) by mouth every 8 (eight) hours as needed.  Taking Active Self  levocetirizine (XYZAL ) 5 MG tablet [id number]  TAKE ONE TABLET BY MOUTH EVERY EVENING Saguier, Gaylin Ke, PA-C  Active   LORazepam  (ATIVAN ) 1 MG tablet [id number]  Take 1 tablet (1 mg total) by mouth 2 (two) times daily as needed for anxiety.  Patient not taking: Reported on 07/04/2023   Deatra Face, MD  Active Self  losartan  (COZAAR ) 100 MG tablet [id number]  Take 1 tablet (100 mg total) by mouth daily. Saguier, Gaylin Ke, PA-C  Active Self  montelukast  (SINGULAIR ) 10 MG tablet [id number]  Take 1 tablet (10 mg  total) by mouth at bedtime. Saguier, Gaylin Ke, PA-C  Active Self  mupirocin  ointment (BACTROBAN ) 2 % [id number]  Apply 1 Application topically daily. [provider]  Active   omeprazole  (PRILOSEC) 40 MG capsule [id number]  Take 1 capsule (40 mg total) by mouth daily. Saguier, Gaylin Ke, PA-C  Active Self  ondansetron  (ZOFRAN ) 4 MG tablet [id number]  Take 1 tablet (4 mg total) by mouth every 8 (eight) hours as needed.   Active   OneTouch Delica Lancets 30G MISC [id number]  Check blood sugar once daily as needed Lillia Reilly, MD  Active Self   Patient not taking:   Discontinued 08/15/23 1121 (Duplicate) oxyCODONE -acetaminophen  (PERCOCET/ROXICET) 5-325 MG tablet [id number]  Take 1 tablet by mouth every 6 (six) hours as needed.   Active   OZEMPIC , 2 MG/DOSE,  8 MG/3ML SOPN [id number]  DIAL AND INJECT UNDER THE SKIN 2 MG WEEKLY Saguier, Gaylin Ke, PA-C  Active Self           Med Note Alida Ion, Siddalee Vanderheiden B   Thu Aug 15, 2023 11:22 AM) Seymour Dapper thru [employer] thru 12/29/2023   Patient not taking:   Discontinued 08/15/23 1122 (Patient Preference) rosuvastatin  (CRESTOR ) 5 MG tablet [id number]  Take 1 tablet (5 mg total) by mouth daily. Saguier, Gaylin Ke, PA-C  Active Self   Patient not taking:   Discontinued 08/15/23 1122 SYMBICORT  160-4.5 MCG/ACT inhaler [id number]  Inhale 2 puffs into the lungs in the morning and at bedtime. Saguier, Gaylin Ke, PA-C  Active   SYRINGE-NEEDLE, DISP, 3 ML (LUER LOCK SAFETY SYRINGES) 25G X 1" 3 ML MISC [id number]  Use as directed with weekly b12 injections Saguier, Gaylin Ke, PA-C  Active Self  tamsulosin  (FLOMAX ) 0.4 MG CAPS capsule [id number]  Take 1 capsule (0.4 mg total) by mouth at bedtime. Zelaya, Oscar A, PA-C  Active Self  triamcinolone cream (KENALOG) 0.1 % [id number]  Apply 1 Application topically daily. [provider]  Active               Assessment/Plan:   Medication Management and access: - reviewed medication list and refill history.  - Contacted [employer]  medication assistance program to check on Ozempic  refill / Shipment. Per representative a shipment has been sent out but was returned to [employer] "due to weather conditions" [employer] representative has submitted for a new shipment to be sent out. Will take 10 to 14 business days (expect delivery to our office between 08/29/2023 and 09/04/2023)   Follow Up Plan: 2 to 3 weeks to F/U [employer] refill for Ozempic   Cecilie Coffee, PharmD Clinical Pharmacist [facility]  Population Health [phone]

## 2023-08-15 NOTE — Telephone Encounter (Signed)
 We dont have this medication in our fridge, do you know anything about this?

## 2023-08-16 DIAGNOSIS — D1771 Benign lipomatous neoplasm of kidney: Secondary | ICD-10-CM | POA: Diagnosis not present

## 2023-08-16 DIAGNOSIS — R3915 Urgency of urination: Secondary | ICD-10-CM | POA: Diagnosis not present

## 2023-08-16 DIAGNOSIS — N130 Hydronephrosis with ureteropelvic junction obstruction: Secondary | ICD-10-CM | POA: Diagnosis not present

## 2023-08-16 DIAGNOSIS — N201 Calculus of ureter: Secondary | ICD-10-CM | POA: Diagnosis not present

## 2023-08-23 ENCOUNTER — Encounter: Payer: Self-pay | Admitting: Pharmacist

## 2023-08-30 ENCOUNTER — Other Ambulatory Visit: Payer: Self-pay | Admitting: Pharmacist

## 2023-08-30 ENCOUNTER — Telehealth: Payer: Self-pay

## 2023-08-30 DIAGNOSIS — E119 Type 2 diabetes mellitus without complications: Secondary | ICD-10-CM

## 2023-08-30 NOTE — Progress Notes (Signed)
 08/30/2023 Name: Theresa Carter MRN: [id number] DOB: [birth year]  Chief Complaint  Patient presents with   Diabetes   Medication Management    Theresa Carter is a 53 y.o. year old female. Coordinated with medication assistance program with Novo Nordisk.    They were referred to the pharmacist by their PCP for assistance in managing medication access.    Subjective: She is now enrolled in a Medicare part D plan as of January 2025. Patient reports today that she just received a letter that she has been approved for both Medicare and Medicaid coverage.  Patient is enrolled in Novo Nordisk medication assistance program thru 12/2023 to receive Ozempic  however will not need now.   Current diabetes medications: Ozempic  2mg  SQ weekly  Previous Medications tired for Diabetes: Invokana  - stopped because was no longer covered by her insurance at the time; metformin  - stopped 07/13/2022 noted as patient preference. Patient does have history of low B12. Metformin  is documented to lower B12 absorption which would exacerbate low serum B12.     She reports that when she took Ozempic  2mg  weekly in the past she had better results - A1c was 6.1%, she lost down to 180lbs and blood glucose was 100 to 120's.   She has seen some weight loss since restarting Ozempic  but weight seems to be stalled at 205 to 210 lbs and A1c still > 7.0%  Checks blood glucose 1 to 2 times per week. Blood glucose usually 120's to 150.  Denies blood glucose > 200 or < 80  Wt Readings from Last 3 Encounters:  07/11/23 211 lb 10.3 oz (96 kg)  07/10/23 212 lb (96.2 kg)  06/26/23 207 lb 10.8 oz (94.2 kg)      Medication Access/Adherence  Current Pharmacy:  [id number] - [place], [place] - [address] [place] [address] Phone: [phone] Fax: [phone]  [id number] - [place], [place] - [address] AT [place] [address] [place] [address] Phone: [phone] Fax: [phone]   Patient reports affordability concerns with their medications: Yes  Patient reports access/transportation concerns to their pharmacy: No  Patient reports adherence concerns with their medications:  Yes  due to cost    Objective:  Lab Results  Component Value Date   HGBA1C 7.6 (H) 07/10/2023    Lab Results  Component Value Date   CREATININE 0.83 07/10/2023   BUN 15 07/10/2023   NA 137 07/10/2023   K 4.2 07/10/2023   CL 113 (H) 07/10/2023   CO2 17 (L) 07/10/2023    Lab Results  Component Value Date   CHOL 216 (H) 01/19/2021   HDL 39 (L) 01/19/2021   LDLCALC 123 (H) 01/19/2021   TRIG 305 (H) 01/19/2021   CHOLHDL 5.5 (H) 01/19/2021    Medications Reviewed Today     Reviewed by Cecilie Coffee, RPH-CPP (Pharmacist) on 08/30/23 at 1247  Med List Status: <None>   Medication Order Taking? Sig Documenting Provider Last Dose Status Informant  acetaminophen  (TYLENOL ) 500 MG tablet [id number] Yes Take 1,000 mg by mouth every 6 (six) hours as needed for mild pain. [provider] Taking Active Self  acetaZOLAMIDE ER (DIAMOX) 500 MG capsule [id number] Yes Take 500 mg by mouth 2 (two) times daily. [provider] Taking Active   Adalimumab (HUMIRA PEN) 40 MG/0.4ML PNKT [id number] Yes Inject 40 mg into the skin once a week. Tuesday [provider] Taking  Active Self           Med Note [name]   Tue Mar 05, 2023 10:44 AM) [facility]  albuterol  (VENTOLIN  HFA) 108 [phone] Base) MCG/ACT inhaler [id number] Yes Inhale 2 puffs into the lungs every 6 (six) hours as needed for wheezing or shortness of breath. Saguier, Gaylin Ke, PA-C Taking Active Self  amLODipine  (NORVASC ) 5 MG tablet [id number] Yes TAKE 1 TABLET BY MOUTH DAILY Saguier, Edward, PA-C Taking Active   aspirin  EC 81 MG tablet [id number] Yes Take 81 mg by mouth daily. Swallow whole. [provider] Taking  Active   Blood Glucose Monitoring Suppl [facility] VERIO) w/Device KIT [id number] Yes 1 kit by Does not apply route daily. Check blood glucose once daily as needed Lillia Reilly, MD Taking Active Self  busPIRone  (BUSPAR ) 15 MG tablet [id number] Yes TAKE 1 TABLET BY MOUTH 2 TIMES A DAY Saguier, Edward, PA-C Taking Active   cefdinir (OMNICEF) 300 MG capsule [id number] Yes Take 300 mg by mouth 2 (two) times daily. [provider] Taking Active   cyclobenzaprine  (FLEXERIL ) 5 MG tablet [id number] Yes TAKE 1 TABLET BY MOUTH EVERY NIGHT AT BEDTIME AS NEEDED FOR NECK PAIN Saguier, Gaylin Ke, PA-C Taking Active   dutasteride (AVODART) 0.5 MG capsule [id number] Yes Take one tablet daily on week days (Monday-Friday). Do not take if pregnant or trying to get pregnant. [provider] Taking Active Self  fluticasone  (FLONASE ) 50 MCG/ACT nasal spray [id number] Yes Place 2 sprays into both nostrils daily. Saguier, Gaylin Ke, PA-C Taking Active   glucose blood (ONETOUCH VERIO) test strip [id number] Yes Check blood glucose once daily as needed Lillia Reilly, MD Taking Active Self  hyoscyamine  (LEVSIN SL) 0.125 MG SL tablet [id number] No Take 1 tablet (0.125 mg total) by mouth every 6 (six) hours as needed for bladder pain and urgency  Patient not taking: Reported on 08/30/2023    Not Taking Active Self  Insulin  Pen Needle (BD PEN NEEDLE NANO 2ND GEN) 32G X 4 MM MISC [id number] Yes USE TO INJECT DIABETIC MEDICATION INTO THE SKIN 1 TIME DAILY Sridharan, Sriramkumar, MD Taking Active Self  ipratropium-albuterol  (DUONEB) 0.5-2.5 (3) MG/3ML SOLN [id number] Yes Take 3 mLs by nebulization every 4 (four) hours as needed. Saguier, Gaylin Ke, PA-C Taking Active Self  ketorolac  (TORADOL ) 10 MG tablet [id number] Yes Take 1 tablet (10 mg total) by mouth every 8 (eight) hours as needed.  Taking Active Self  levocetirizine (XYZAL ) 5 MG tablet [id number] Yes TAKE ONE TABLET BY MOUTH EVERY EVENING Saguier, Gaylin Ke, PA-C Taking Active    LORazepam  (ATIVAN ) 1 MG tablet [id number] No Take 1 tablet (1 mg total) by mouth 2 (two) times daily as needed for anxiety.  Patient not taking: Reported on 08/30/2023   Deatra Face, MD Not Taking Active Self  losartan  (COZAAR ) 100 MG tablet [id number] Yes Take 1 tablet (100 mg total) by mouth daily. Saguier, Gaylin Ke, PA-C Taking Active Self  montelukast  (SINGULAIR ) 10 MG tablet [id number] Yes Take 1 tablet (10 mg total) by mouth at bedtime. Saguier, Gaylin Ke, PA-C Taking Active Self  mupirocin  ointment (BACTROBAN ) 2 % [id number] Yes Apply 1 Application topically daily. [provider] Taking Active   omeprazole  (PRILOSEC) 40 MG capsule [id number] Yes Take 1 capsule (40 mg total) by mouth daily. Saguier, Gaylin Ke, PA-C Taking Active Self  ondansetron  (ZOFRAN ) 4 MG tablet [id number] No Take 1 tablet (4 mg total) by mouth every 8 (eight) hours as needed.  Patient not taking: Reported on 08/30/2023  Not Taking Active   OneTouch Delica Lancets 30G MISC [id number] Yes Check blood sugar once daily as needed Lillia Reilly, MD Taking Active Self  oxyCODONE -acetaminophen  (PERCOCET/ROXICET) 5-325 MG tablet [id number] Yes Take 1 tablet by mouth every 6 (six) hours as needed.  Taking Active   OZEMPIC , 2 MG/DOSE, 8 MG/3ML SOPN [id number] Yes DIAL AND INJECT UNDER THE SKIN 2 MG WEEKLY Saguier, Gaylin Ke, PA-C Taking Active Self           Med Note Alida Ion, Girtha Kilgore B   Thu Aug 15, 2023 11:22 AM) [facility] thru [employer]o [employer] thru 12/29/2023  rosuvastatin  (CRESTOR ) 5 MG tablet [id number] Yes Take 1 tablet (5 mg total) by mouth daily. Saguier, Edward, PA-C Taking Active Self  SYMBICORT  160-4.5 MCG/ACT inhaler [id number] Yes Inhale 2 puffs into the lungs in the morning and at bedtime. Saguier, Gaylin Ke, PA-C Taking Active   SYRINGE-NEEDLE, DISP, 3 ML (LUER LOCK SAFETY SYRINGES) 25G X 1" 3 ML MISC [id number] Yes Use as directed with weekly b12 injections Saguier, Gaylin Ke, PA-C Taking Active Self  tamsulosin  (FLOMAX ) 0.4 MG CAPS  capsule [id number] Yes Take 1 capsule (0.4 mg total) by mouth at bedtime. Zelaya, Oscar A, PA-C Taking Active Self  triamcinolone cream (KENALOG) 0.1 % [id number] Yes Apply 1 Application topically daily. [provider] Taking Active               Assessment/Plan:  Diabetes: A1c and home blood glucose not at goal with max doss of Ozempic  2mg  weekly - Change to Mounjaro - recommend start with 7.5mg  weekly - if approved by PCP. Will likely need prior authorization  - Will continue Ozempic  2mg  weekly until we are able to get prior authorization for Mounjaro. If approved will start Mounjaro 7.5mg  weekly 1 week after last dose of Ozempcic.  - continue to check blood glucose 2 times per week   Medication Management and access: - reviewed medication list and refill history.  - Contacted [employer]o Nordisk medication assistance program to cancel Ozempic  patient assistance program since patient is now duel eligible.   Follow Up Plan: 2 to 3 weeks to F/U Novo Nordisk refill for Ozempic   Cecilie Coffee, PharmD Clinical Pharmacist [facility]  Population Health [phone]

## 2023-08-30 NOTE — Patient Instructions (Signed)
I have personally reviewed this encounter including the documentation in this note and have collaborated with the care management provider regarding care management and care coordination activities to include development and update of the comprehensive care plan. I am certifying that I agree with the content of this note and encounter as supervising provider.  Kanon Colunga, PA-C   

## 2023-08-30 NOTE — Telephone Encounter (Signed)
 Ozempic  received in office , pt notifed via mychart

## 2023-09-04 ENCOUNTER — Encounter: Payer: Self-pay | Admitting: Medical

## 2023-09-05 ENCOUNTER — Other Ambulatory Visit: Payer: Self-pay | Admitting: Medical

## 2023-09-10 ENCOUNTER — Other Ambulatory Visit: Payer: Self-pay | Admitting: Medical

## 2023-10-01 ENCOUNTER — Other Ambulatory Visit: Payer: Self-pay | Admitting: Medical

## 2023-10-02 NOTE — Telephone Encounter (Signed)
 Rx refill flexeril sent to pharmacy.

## 2023-10-03 ENCOUNTER — Other Ambulatory Visit: Payer: Self-pay | Admitting: Medical

## 2023-10-08 DIAGNOSIS — H5213 Myopia, bilateral: Secondary | ICD-10-CM | POA: Diagnosis not present

## 2023-10-08 DIAGNOSIS — E119 Type 2 diabetes mellitus without complications: Secondary | ICD-10-CM | POA: Diagnosis not present

## 2023-10-08 DIAGNOSIS — Z7984 Long term (current) use of oral hypoglycemic drugs: Secondary | ICD-10-CM | POA: Diagnosis not present

## 2023-10-08 DIAGNOSIS — H43393 Other vitreous opacities, bilateral: Secondary | ICD-10-CM | POA: Diagnosis not present

## 2023-10-08 DIAGNOSIS — H524 Presbyopia: Secondary | ICD-10-CM | POA: Diagnosis not present

## 2023-10-08 DIAGNOSIS — H52203 Unspecified astigmatism, bilateral: Secondary | ICD-10-CM | POA: Diagnosis not present

## 2023-10-08 DIAGNOSIS — G932 Benign intracranial hypertension: Secondary | ICD-10-CM | POA: Diagnosis not present

## 2023-10-08 LAB — HM DIABETES EYE EXAM

## 2023-10-09 ENCOUNTER — Other Ambulatory Visit: Payer: Self-pay | Admitting: Medical

## 2023-10-10 ENCOUNTER — Encounter: Payer: Self-pay | Admitting: Medical

## 2023-10-16 ENCOUNTER — Encounter: Payer: Self-pay | Admitting: Medical

## 2023-10-16 MED ORDER — OMEPRAZOLE 40 MG PO CPDR: 40.0000 mg | DELAYED_RELEASE_CAPSULE | Freq: Every day | ORAL | 0 refills | Status: DC

## 2023-10-23 ENCOUNTER — Ambulatory Visit

## 2023-10-23 VITALS — BP 114/67 | Ht 65.0 in | Wt 207.0 lb

## 2023-10-23 DIAGNOSIS — Z0000 Encounter for general adult medical examination without abnormal findings: Secondary | ICD-10-CM | POA: Diagnosis not present

## 2023-10-23 NOTE — Progress Notes (Signed)
 Because this visit was a virtual/telehealth visit,  certain criteria was not obtained, such a blood pressure, CBG if applicable, and timed get up and go. Any medications not marked as taking were not mentioned during the medication reconciliation part of the visit. Any vitals not documented were not able to be obtained due to this being a telehealth visit or patient was unable to self-report a recent blood pressure reading due to a lack of equipment at home via telehealth. Vitals that have been documented are verbally provided by the patient.   This visit was performed by a medical professional under my direct supervision. I was immediately available for consultation/collaboration. I have reviewed and agree with the Annual Wellness Visit documentation.  Subjective:   Theresa Carter is a 53 y.o. who presents for a Medicare Wellness preventive visit.  As a reminder, Annual Wellness Visits don't include a physical exam, and some assessments may be limited, especially if this visit is performed virtually. We may recommend an in-person follow-up visit with your provider if needed.  Visit Complete: Virtual I connected with  Theresa Carter on 10/23/23 by a audio enabled telemedicine application and verified that I am speaking with the correct person using two identifiers.  Patient Location: Home  Provider Location: Home Office  I discussed the limitations of evaluation and management by telemedicine. The patient expressed understanding and agreed to proceed.  Vital Signs: Because this visit was a virtual/telehealth visit, some criteria may be missing or patient reported. Any vitals not documented were not able to be obtained and vitals that have been documented are patient reported.  VideoDeclined- This patient declined Librarian, academic. Therefore the visit was completed with audio only.  Persons Participating in Visit: Patient.  AWV Questionnaire: No: Patient  Medicare AWV questionnaire was not completed prior to this visit.  Cardiac Risk Factors include: advanced age (>75men, >65 women);obesity (BMI >30kg/m2);diabetes mellitus;hypertension;dyslipidemia     Objective:    Today's Vitals   10/23/23 1710  BP: 114/67  Weight: 207 lb (93.9 kg)  Height: 5' 5 (1.651 m)   Body mass index is 34.45 kg/m.     10/23/2023    5:09 PM 07/11/2023    9:51 AM 07/10/2023    2:14 PM 06/26/2023    5:22 PM 06/25/2023    2:41 PM 04/16/2023    4:13 PM 12/06/2021    1:44 PM  Advanced Directives  Does Patient Have a Medical Advance Directive? No Yes Yes Yes No No Yes  Type of Special educational needs teacher of [place];Living will Healthcare Power of [place];Living will Living will;Healthcare Power of Attorney   Living will;Healthcare Power of Attorney  Does patient want to make changes to medical advance directive?  No - Patient declined No - Guardian declined      Copy of Healthcare Power of Attorney in Chart?  No - copy requested No - copy requested      Would patient like information on creating a medical advance directive? No - Patient declined          Current Medications (verified) Outpatient Encounter Medications as of 10/23/2023  Medication Sig   acetaminophen  (TYLENOL ) 500 MG tablet Take 1,000 mg by mouth every 6 (six) hours as needed for mild pain.   acetaZOLAMIDE ER (DIAMOX) 500 MG capsule Take 500 mg by mouth 2 (two) times daily.   Adalimumab (HUMIRA PEN) 40 MG/0.4ML PNKT Inject 40 mg into the skin once a week. Tuesday   albuterol  (VENTOLIN   HFA) 108 (90 Base) MCG/ACT inhaler Inhale 2 puffs into the lungs every 6 (six) hours as needed for wheezing or shortness of breath.   amLODipine  (NORVASC ) 5 MG tablet TAKE 1 TABLET BY MOUTH DAILY   aspirin  EC 81 MG tablet Take 81 mg by mouth daily. Swallow whole.   Blood Glucose Monitoring Suppl (ONETOUCH VERIO) w/Device KIT 1 kit by Does not apply route daily. Check blood glucose once daily as needed    busPIRone  (BUSPAR ) 15 MG tablet Take 1 tablet (15 mg total) by mouth 2 (two) times daily.   cefdinir (OMNICEF) 300 MG capsule Take 300 mg by mouth 2 (two) times daily.   cyclobenzaprine  (FLEXERIL ) 5 MG tablet TAKE 1 TABLET BY MOUTH EVERY NIGHT AT BEDTIME AS NEEDED FOR NECK PAIN   dutasteride (AVODART) 0.5 MG capsule Take one tablet daily on week days (Monday-Friday). Do not take if pregnant or trying to get pregnant.   fluticasone  (FLONASE ) 50 MCG/ACT nasal spray Place 2 sprays into both nostrils daily.   glucose blood (ONETOUCH VERIO) test strip Check blood glucose once daily as needed   Insulin  Pen Needle (BD PEN NEEDLE NANO 2ND GEN) 32G X 4 MM MISC USE TO INJECT DIABETIC MEDICATION INTO THE SKIN 1 TIME DAILY   ipratropium-albuterol  (DUONEB) 0.5-2.5 (3) MG/3ML SOLN Take 3 mLs by nebulization every 4 (four) hours as needed.   ketorolac  (TORADOL ) 10 MG tablet Take 1 tablet (10 mg total) by mouth every 8 (eight) hours as needed.   levocetirizine (XYZAL ) 5 MG tablet TAKE ONE TABLET BY MOUTH EVERY EVENING   losartan  (COZAAR ) 100 MG tablet Take 1 tablet (100 mg total) by mouth daily.   montelukast  (SINGULAIR ) 10 MG tablet Take 1 tablet (10 mg total) by mouth at bedtime.   mupirocin  ointment (BACTROBAN ) 2 % Apply 1 Application topically daily.   omeprazole  (PRILOSEC) 40 MG capsule Take 1 capsule (40 mg total) by mouth daily.   OneTouch Delica Lancets 30G MISC Check blood sugar once daily as needed   oxyCODONE -acetaminophen  (PERCOCET/ROXICET) 5-325 MG tablet Take 1 tablet by mouth every 6 (six) hours as needed.   OZEMPIC , 2 MG/DOSE, 8 MG/3ML SOPN DIAL AND INJECT UNDER THE SKIN 2 MG WEEKLY   rosuvastatin  (CRESTOR ) 5 MG tablet TAKE 1 TABLET BY MOUTH DAILY   SYMBICORT  160-4.5 MCG/ACT inhaler Inhale 2 puffs into the lungs in the morning and at bedtime.   SYRINGE-NEEDLE, DISP, 3 ML (LUER LOCK SAFETY SYRINGES) 25G X 1 3 ML MISC Use as directed with weekly b12 injections   tamsulosin  (FLOMAX ) 0.4 MG CAPS  capsule Take 1 capsule (0.4 mg total) by mouth at bedtime.   triamcinolone cream (KENALOG) 0.1 % Apply 1 Application topically daily.   hyoscyamine  (LEVSIN  SL) 0.125 MG SL tablet Take 1 tablet (0.125 mg total) by mouth every 6 (six) hours as needed for bladder pain and urgency (Patient not taking: Reported on 10/23/2023)   LORazepam  (ATIVAN ) 1 MG tablet Take 1 tablet (1 mg total) by mouth 2 (two) times daily as needed for anxiety. (Patient not taking: Reported on 10/23/2023)   ondansetron  (ZOFRAN ) 4 MG tablet Take 1 tablet (4 mg total) by mouth every 8 (eight) hours as needed. (Patient not taking: Reported on 10/23/2023)   No facility-administered encounter medications on file as of 10/23/2023.    Allergies (verified) Patient has no known allergies.   History: Past Medical History:  Diagnosis Date   Anxiety    Arthritis    Asthma    Chest  pain 07/29/2018   Diabetes mellitus without complication (HCC)    Ectopic pregnancy    GERD (gastroesophageal reflux disease)    Headache    History of hiatal hernia    History of kidney stones    Hypertension    Past Surgical History:  Procedure Laterality Date   BRAIN SURGERY  01/02/2023   stent placed in artery ([facility])   CHOLECYSTECTOMY     CYSTOSCOPY W/ URETERAL STENT PLACEMENT Right 06/26/2023   Procedure: CYSTOSCOPY, WITH RETROGRADE PYELOGRAM AND URETERAL STENT INSERTION;  Surgeon: Elisabeth Valli BIRCH, MD;  Location: [facility];  Service: Urology;  Laterality: Right;   CYSTOSCOPY/URETEROSCOPY/HOLMIUM LASER/STENT PLACEMENT Right 07/11/2023   Procedure: CYSTOSCOPY/URETEROSCOPY, REMOVAL OF STONE BASKETING;  Surgeon: Elisabeth Valli BIRCH, MD;  Location: [facility];  Service: Urology;  Laterality: Right;  CYSTOSCOPY/URETEROSCOPY/HOLMIUM LASER/STENT EXCHANGE   DILATION AND CURETTAGE OF UTERUS     ENDOMETRIAL ABLATION     HIP ARTHROSCOPY Left    x2  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   Social History   Socioeconomic History   Marital status: Married    Spouse name: Not on file   Number of children: Not on file   Years of education: Not on file   Highest education level: Some college, no degree  Occupational History   Not on file  Tobacco Use   Smoking status: Former    Current packs/day: 0.00    Types: Cigarettes    Quit date: 2001    Years since quitting: 24.5   Smokeless tobacco: Never   Tobacco comments:    Quit 2001.  Vaping Use   Vaping status: Never Used  Substance and Sexual Activity   Alcohol use: Yes    Comment: RARE   Drug use: Never   Sexual activity: Not on file  Other Topics Concern   Not on file  Social History Narrative   Not on file   Social Drivers of Health   Financial Resource Strain: Medium Risk (10/23/2023)   Overall Financial Resource Strain (CARDIA)    Difficulty of Paying Living Expenses: Somewhat hard  Food Insecurity: No Food Insecurity (10/23/2023)   Hunger Vital Sign    Worried About Running Out of Food in the Last Year: Never true    Ran Out of Food in the Last Year: Never true  Transportation Needs: No Transportation Needs (10/23/2023)   PRAPARE - Administrator, Civil Service (Medical): No    Lack of Transportation (Non-Medical): No  Physical Activity: Insufficiently Active (10/23/2023)   Exercise Vital Sign    Days of Exercise per Week: 3 days    Minutes of Exercise per Session: 30 min  Stress: Stress Concern Present (10/23/2023)   [employer] of Occupational Health - Occupational Stress Questionnaire    Feeling of Stress: To some extent  Social Connections: Moderately Isolated (10/23/2023)   Social  Connection and Isolation Panel    Frequency of Communication with  Friends and Family: More than three times a week    Frequency of Social Gatherings with Friends and Family: Twice a week    Attends Religious Services: Never    Database administrator or Organizations: No    Attends Banker Meetings: Never    Marital Status: Married    Tobacco Counseling Counseling given: Not Answered Tobacco comments: Quit 2001.    Clinical Intake:  Pre-visit preparation completed: Yes  Pain : No/denies pain     BMI - recorded: 34.45 Nutritional Status: BMI > 30  Obese Nutritional Risks: None Diabetes: Yes CBG done?: No Did pt. bring in CBG monitor from home?: No  Lab Results  Component Value Date   HGBA1C 7.6 (H) 07/10/2023   HGBA1C 7.2 (H) 12/19/2022   HGBA1C 6.7 (H) 06/13/2022     How often do you need to have someone help you when you read instructions, pamphlets, or other written materials from your doctor or pharmacy?: 1 - Never What is the last grade level you completed in school?: some college     Information entered by :: Dillon Mcreynolds,cma   Activities of Daily Living     10/23/2023    5:12 PM 07/10/2023    2:17 PM  In your present state of health, do you have any difficulty performing the following activities:  Hearing? 0   Vision? 0   Difficulty concentrating or making decisions? 0   Walking or climbing stairs? 0   Dressing or bathing? 0   Doing errands, shopping? 0 0  Preparing Food and eating ? N   Using the Toilet? N   In the past six months, have you accidently leaked urine? Y   Do you have problems with loss of bowel control? N   Managing your Medications? N   Managing your Finances? N   Housekeeping or managing your Housekeeping? N     Patient Care Team: Saguier, Edward, PA-C as PCP - General (Internal Medicine)  I have updated your Care Teams any recent Medical Services you may have received from other providers in the past year.      Assessment:   This is a routine wellness examination for Theresa Carter.  Hearing/Vision screen Hearing Screening - Comments:: No difficulties Vision Screening - Comments:: Patient wears glasses   Goals Addressed             This Visit's Progress    Patient Stated       Get a better position       Depression Screen     10/23/2023    5:14 PM 06/13/2022    1:55 PM 12/06/2021    1:44 PM 07/20/2021    2:52 PM 04/20/2021   11:02 AM 03/21/2021    2:39 PM 03/13/2021    3:47 PM  PHQ 2/9 Scores  PHQ - 2 Score 0 0 0 0 0 0 0  PHQ- 9 Score 2 5  2 2 2      Fall Risk     10/23/2023    5:12 PM 06/13/2022    1:55 PM 12/06/2021    1:44 PM 07/20/2021    2:50 PM 04/20/2021   11:00 AM  Fall Risk   Falls in the past year? 0 0 0 0 1  Comment     SLIPPED IN BATHROOM WHILE CLEANING  Number falls in past yr: 0 0 0 0 0  Injury with Fall? 0 0 0 0 0  Risk for fall due to : No  Fall Risks No Fall Risks No Fall Risks No Fall Risks Impaired balance/gait  Follow up Falls evaluation completed Falls evaluation completed;Education provided Falls evaluation completed;Falls prevention discussed  Falls evaluation completed  Falls evaluation completed;Education provided      Data saved with a previous flowsheet row definition    MEDICARE RISK AT HOME:  Medicare Risk at Home Any stairs in or around the home?: Yes If so, are there any without handrails?: No Home free of loose throw rugs in walkways, pet beds, electrical cords, etc?: Yes Adequate lighting in your home to reduce risk of falls?: Yes Life alert?: No Use of a cane, walker or w/c?: No Grab bars in the bathroom?: No Shower chair or bench in shower?: No Elevated toilet seat or a handicapped toilet?: No  TIMED UP AND GO:  Was the test performed?  No  Cognitive Function: 6CIT completed        10/23/2023    5:11 PM  6CIT Screen  What Year? 0 points  What month? 0 points  What time? 0 points  Count back from 20 0 points  Months in reverse 0  points  Repeat phrase 0 points  Total Score 0 points    Immunizations Immunization History  Administered Date(s) Administered   Influenza Whole 02/14/2012   Influenza,inj,Quad PF,6+ Mos 02/23/2020   Influenza-Unspecified 02/16/2022   MMR 01/04/2012   Moderna Sars-Covid-2 Vaccination 10/01/2019, 10/27/2019    Screening Tests Health Maintenance  Topic Date Due   Pneumococcal Vaccine 21-88 Years old (1 of 2 - PCV) Never done   Hepatitis B Vaccines (1 of 3 - 19+ 3-dose series) Never done   Diabetic kidney evaluation - Urine ACR  04/20/2022   FOOT EXAM  04/20/2022   Zoster Vaccines- Shingrix (1 of 2) 07/01/2024 (Originally 01/17/1990)   COVID-19 Vaccine (3 - Moderna risk series) 11/17/2024 (Originally 11/24/2019)   INFLUENZA VACCINE  11/22/2023   HEMOGLOBIN A1C  01/10/2024   Colonoscopy  05/25/2024   Diabetic kidney evaluation - eGFR measurement  07/09/2024   OPHTHALMOLOGY EXAM  10/07/2024   Medicare Annual Wellness (AWV)  10/22/2024   Cervical Cancer Screening (HPV/Pap Cotest)  02/09/2025   MAMMOGRAM  05/30/2025   Hepatitis C Screening  Completed   HIV Screening  Completed   HPV VACCINES  Aged Out   Meningococcal B Vaccine  Aged Out   DTaP/Tdap/Td  Discontinued    Health Maintenance  Health Maintenance Due  Topic Date Due   Pneumococcal Vaccine 24-11 Years old (1 of 2 - PCV) Never done   Hepatitis B Vaccines (1 of 3 - 19+ 3-dose series) Never done   Diabetic kidney evaluation - Urine ACR  04/20/2022   FOOT EXAM  04/20/2022   Health Maintenance Items Addressed:   Additional Screening:  Vision Screening: Recommended annual ophthalmology exams for early detection of glaucoma and other disorders of the eye. Would you like a referral to an eye doctor? No    Dental Screening: Recommended annual dental exams for proper oral hygiene  Community Resource Referral / Chronic Care Management: CRR required this visit?  No   CCM required this visit?  No   Plan:    I have  personally reviewed and noted the following in the patient's chart:   Medical and social history Use of alcohol, tobacco or illicit drugs  Current medications and supplements including opioid prescriptions. Patient is not currently taking opioid prescriptions. Functional ability and status Nutritional status Physical activity Advanced directives List of other physicians  Hospitalizations, surgeries, and ER visits in previous 12 months Vitals Screenings to include cognitive, depression, and falls Referrals and appointments  In addition, I have reviewed and discussed with patient certain preventive protocols, quality metrics, and best practice recommendations. A written personalized care plan for preventive services as well as general preventive health recommendations were provided to patient.   Lyle MARLA Right, [place]   10/23/2023   After Visit Summary: (MyChart) Due to this being a telephonic visit, the after visit summary with patients personalized plan was offered to patient via MyChart   Notes: Nothing significant to report at this time.

## 2023-10-23 NOTE — Patient Instructions (Signed)
 Theresa Carter , Thank you for taking time out of your busy schedule to complete your Annual Wellness Visit with me. I enjoyed our conversation and look forward to speaking with you again next year. I, as well as your care team,  appreciate your ongoing commitment to your health goals. Please review the following plan we discussed and let me know if I can assist you in the future. Your Game plan/ To Do List    Referrals: If you haven't heard from the office you've been referred to, please reach out to them at the phone provided.  none Follow up Visits: Next Medicare AWV with our clinical staff: patient declined    Have you seen your provider in the last 6 months (3 months if uncontrolled diabetes)? No Next Office Visit with your provider: n/a  Clinician Recommendations:  Aim for 30 minutes of exercise or brisk walking, 6-8 glasses of water , and 5 servings of fruits and vegetables each day.       This is a list of the screening recommended for you and due dates:  Health Maintenance  Topic Date Due   Pneumococcal Vaccination (1 of 2 - PCV) Never done   Hepatitis B Vaccine (1 of 3 - 19+ 3-dose series) Never done   Yearly kidney health urinalysis for diabetes  04/20/2022   Complete foot exam   04/20/2022   Zoster (Shingles) Vaccine (1 of 2) 07/01/2024*   COVID-19 Vaccine (3 - Moderna risk series) 11/17/2024*   Flu Shot  11/22/2023   Hemoglobin A1C  01/10/2024   Colon Cancer Screening  05/25/2024   Yearly kidney function blood test for diabetes  07/09/2024   Eye exam for diabetics  10/07/2024   Medicare Annual Wellness Visit  10/22/2024   Pap with HPV screening  02/09/2025   Mammogram  05/30/2025   Hepatitis C Screening  Completed   HIV Screening  Completed   HPV Vaccine  Aged Out   Meningitis B Vaccine  Aged Out   DTaP/Tdap/Td vaccine  Discontinued  *Topic was postponed. The date shown is not the original due date.    Advanced directives: (Declined) Advance directive discussed with you  today. Even though you declined this today, please call our office should you change your mind, and we can give you the proper paperwork for you to fill out. Advance Care Planning is important because it:  [x]  Makes sure you receive the medical care that is consistent with your values, goals, and preferences  [x]  It provides guidance to your family and loved ones and reduces their decisional burden about whether or not they are making the right decisions based on your wishes.  Follow the link provided in your after visit summary or read over the paperwork we have mailed to you to help you started getting your Advance Directives in place. If you need assistance in completing these, please reach out to us  so that we can help you!  See attachments for Preventive Care and Fall Prevention Tips.

## 2023-11-20 ENCOUNTER — Other Ambulatory Visit: Payer: Self-pay | Admitting: Medical

## 2023-11-21 ENCOUNTER — Encounter: Payer: Self-pay | Admitting: Medical

## 2023-11-22 MED ORDER — TIRZEPATIDE 5 MG/0.5ML ~~LOC~~ SOAJ: 5.0000 mg | SUBCUTANEOUS | 0 refills | Status: DC

## 2023-11-22 NOTE — Addendum Note (Signed)
 Addended by: DORINA DALLAS HERO on: 11/22/2023 07:14 PM   Modules accepted: Orders

## 2023-11-28 ENCOUNTER — Other Ambulatory Visit: Payer: Self-pay | Admitting: Medical

## 2023-11-29 ENCOUNTER — Other Ambulatory Visit: Payer: Self-pay

## 2023-11-29 ENCOUNTER — Other Ambulatory Visit (HOSPITAL_COMMUNITY): Payer: Self-pay

## 2023-12-02 ENCOUNTER — Other Ambulatory Visit: Payer: Self-pay | Admitting: Medical

## 2023-12-10 ENCOUNTER — Ambulatory Visit: Admitting: Neurosurgery

## 2023-12-13 ENCOUNTER — Encounter: Payer: Self-pay | Admitting: Medical

## 2023-12-16 ENCOUNTER — Ambulatory Visit (INDEPENDENT_AMBULATORY_CARE_PROVIDER_SITE_OTHER): Admitting: Medical

## 2023-12-16 VITALS — BP 136/84 | HR 80 | Resp 18 | Ht 65.0 in | Wt 210.0 lb

## 2023-12-16 DIAGNOSIS — F411 Generalized anxiety disorder: Secondary | ICD-10-CM

## 2023-12-16 DIAGNOSIS — Z0184 Encounter for antibody response examination: Secondary | ICD-10-CM | POA: Diagnosis not present

## 2023-12-16 DIAGNOSIS — Z7985 Long-term (current) use of injectable non-insulin antidiabetic drugs: Secondary | ICD-10-CM | POA: Diagnosis not present

## 2023-12-16 DIAGNOSIS — M542 Cervicalgia: Secondary | ICD-10-CM

## 2023-12-16 DIAGNOSIS — E119 Type 2 diabetes mellitus without complications: Secondary | ICD-10-CM

## 2023-12-16 DIAGNOSIS — B070 Plantar wart: Secondary | ICD-10-CM

## 2023-12-16 LAB — MICROALBUMIN / CREATININE URINE RATIO
Creatinine,U: 71.4 mg/dL
Microalb Creat Ratio: UNDETERMINED mg/g (ref 0.0–30.0)
Microalb, Ur: <0.7 mg/dL

## 2023-12-16 NOTE — Patient Instructions (Signed)
 Type 2 diabetes mellitus Type 2 diabetes with last A1c of 7.6. Currently on Mounjaro  5 mg without side effects. No family hx thyroid  cancer or personal history of pancreatitis. Assess glycemic control and adjust medication if needed. - Order A1c and metabolic panel. - Evaluate A1c for Mounjaro  dosage adjustment. - Consider increasing Mounjaro  if A1c not controlled and no side effects. - Order  urine microalbumin   Generalized anxiety disorder Anxiety managed with Buspirone  15 mg BID. Heightened due to husband's leukemia but not affecting daily function. Previously on Prozac , discontinued for weight gain. Ativan  used briefly in past for severe episodes. Not needed presently. - Continue Buspirone  15 mg BID.  Obesity Obesity with weight at 210 lbs, down from 225 lbs post-Prozac . Weight loss goal with low sugar diet and protein shakes. Weight management important for diabetes control. - Encourage low sugar diet and protein intake.   Follow up 3-6 month or sooner if needed

## 2023-12-16 NOTE — Progress Notes (Unsigned)
 Subjective:    Patient ID: Theresa Carter, female    DOB: Apr 12, 1971, 53 y.o.   MRN: [id number]  HPI Theresa Carter is a 53 year old female with type 2 diabetes who presents for follow-up and medication management.  She is currently on a GLP-1 receptor agonist, Mounjaro , at a dose of 5 mg. Her last hemoglobin A1c was 7.6, measured five months ago. She is due for her next injection on Thursday. No nausea, vomiting, stomach pain, or bloating. She has not been checking her blood sugars regularly. She follows a low sugar diet and has experienced weight fluctuations, previously gaining weight on Prozac  but losing it after discontinuation. Her current weight is 210 pounds, down from 225 pounds.  She has a history of generalized anxiety disorder, previously managed with Prozac , which she discontinued due to weight gain. She is now on Buspirone  15 mg twice daily, which she feels is adequate. She has had episodes of severe anxiety requiring Ativan  in the past, particularly during hospital visits when she felt she couldn't breathe, although she was able to breathe. Her anxiety is currently heightened due to her husband's leukemia diagnosis and treatment, but she manages to get through her day. She uses muscle relaxers at night to aid sleep.  There is no family history of thyroid  cancer. 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      Review of Systems  Constitutional:  Negative for chills, fatigue and fever.  HENT:  Negative for congestion and ear pain.   Respiratory:  Negative for cough, chest tightness, wheezing and stridor.   Cardiovascular:  Negative for chest pain and palpitations.  Gastrointestinal:  Negative for abdominal pain, constipation and diarrhea.  Musculoskeletal:  Negative for back pain and myalgias.  Hematological:  Does not bruise/bleed easily.  Psychiatric/Behavioral:   Negative for agitation, confusion, dysphoric mood, sleep disturbance and suicidal ideas. The patient is nervous/anxious.     Past Medical History:  Diagnosis Date   Anxiety    Arthritis    Asthma    Chest pain 07/29/2018   Diabetes mellitus without complication (HCC)    Ectopic pregnancy    GERD (gastroesophageal reflux disease)    Headache    History of hiatal hernia    History of kidney stones    Hypertension      Social History   Socioeconomic History   Marital status: Married    Spouse name: Not on file   Number of children: Not on file   Years of education: Not on file   Highest education level: Some college, no degree  Occupational History   Not on file  Tobacco Use   Smoking status: Former    Current packs/day: 0.00    Types: Cigarettes    Quit date: 2001    Years since quitting: 24.6   Smokeless tobacco: Never   Tobacco comments:    Quit 2001.  Vaping Use   Vaping status: Never Used  Substance and Sexual Activity   Alcohol use: Yes    Comment: RARE   Drug use: Never   Sexual activity: Not on file  Other Topics Concern   Not on file  Social History Narrative   Not on file   Social Drivers of Health   Financial Resource Strain: Medium Risk (12/16/2023)   Overall Financial Resource Strain (CARDIA)    Difficulty of Paying Living Expenses: Somewhat hard  Food Insecurity:  No Food Insecurity (12/16/2023)   Hunger Vital Sign    Worried About Running Out of Food in the Last Year: Never true    Ran Out of Food in the Last Year: Never true  Transportation Needs: No Transportation Needs (12/16/2023)   PRAPARE - Administrator, Civil Service (Medical): No    Lack of Transportation (Non-Medical): No  Physical Activity: Insufficiently Active (12/16/2023)   Exercise Vital Sign    Days of Exercise per Week: 2 days    Minutes of Exercise per Session: 30 min  Stress: Stress Concern Present (12/16/2023)   [employer] of Occupational Health -  Occupational Stress Questionnaire    Feeling of Stress: Rather much  Social Connections: Moderately Isolated (12/16/2023)   Social Connection and Isolation Panel    Frequency of Communication with Friends and Family: Twice a week    Frequency of Social Gatherings with Friends and Family: More than three times a week    Attends Religious Services: Never    Database administrator or Organizations: No    Attends Engineer, structural: Not on file    Marital Status: Married  Catering manager Violence: Not At Risk (10/23/2023)   Humiliation, Afraid, Rape, and Kick questionnaire    Fear of Current or Ex-Partner: No    Emotionally Abused: No    Physically Abused: No    Sexually Abused: No    Past Surgical History:  Procedure Laterality Date   BRAIN SURGERY  01/02/2023   stent placed in artery ([facility])   CHOLECYSTECTOMY     CYSTOSCOPY W/ URETERAL STENT PLACEMENT Right 06/26/2023   Procedure: CYSTOSCOPY, WITH RETROGRADE PYELOGRAM AND URETERAL STENT INSERTION;  Surgeon: Elisabeth Valli BIRCH, MD;  Location: [facility];  Service: Urology;  Laterality: Right;   CYSTOSCOPY/URETEROSCOPY/HOLMIUM LASER/STENT PLACEMENT Right 07/11/2023   Procedure: CYSTOSCOPY/URETEROSCOPY, REMOVAL OF STONE BASKETING;  Surgeon: Elisabeth Valli BIRCH, MD;  Location: [facility];  Service: Urology;  Laterality: Right;  CYSTOSCOPY/URETEROSCOPY/HOLMIUM LASER/STENT EXCHANGE   DILATION AND CURETTAGE OF UTERUS     ENDOMETRIAL ABLATION     HIP ARTHROSCOPY Left    x2  second time hip was reconstructed with donated tisue   SALPINGECTOMY     TRIGGER FINGER RELEASE     TUBAL LIGATION     WRIST ARTHROSCOPY      Family History  Problem Relation Age of Onset   Venous thrombosis Mother    Breast cancer Mother    Lung cancer Father    Aneurysm Father        brain   Heart attack Brother    Stroke Brother    Venous thrombosis Son        Has antiphospholipid syndrome, and bechets disease.    No Known Allergies  Current Outpatient  Medications on File Prior to Visit  Medication Sig Dispense Refill   acetaminophen  (TYLENOL ) 500 MG tablet Take 1,000 mg by mouth every 6 (six) hours as needed for mild pain.     acetaZOLAMIDE ER (DIAMOX) 500 MG capsule Take 500 mg by mouth 2 (two) times daily.     Adalimumab (HUMIRA PEN) 40 MG/0.4ML PNKT Inject 40 mg into the skin once a week. Tuesday     albuterol  (VENTOLIN  HFA) 108 (90 Base) MCG/ACT inhaler Inhale 2 puffs into the lungs every 6 (six) hours as needed for wheezing or shortness of breath. 8 g 3   amLODipine  (NORVASC ) 5 MG tablet TAKE 1 TABLET BY MOUTH DAILY 90 tablet 3  aspirin  EC 81 MG tablet Take 81 mg by mouth daily. Swallow whole.     Blood Glucose Monitoring Suppl (ONETOUCH VERIO) w/Device KIT 1 kit by Does not apply route daily. Check blood glucose once daily as needed 1 kit 0   busPIRone  (BUSPAR ) 15 MG tablet TAKE 1 TABLET BY MOUTH 2 TIMES A DAY 180 tablet 0   cefdinir (OMNICEF) 300 MG capsule Take 300 mg by mouth 2 (two) times daily.     cyclobenzaprine  (FLEXERIL ) 5 MG tablet TAKE 1 TABLET BY MOUTH EVERY NIGHT AT BEDTIME AS NEEDED FOR NECK PAIN 20 tablet 1   dutasteride (AVODART) 0.5 MG capsule Take one tablet daily on week days (Monday-Friday). Do not take if pregnant or trying to get pregnant.     fluticasone  (FLONASE ) 50 MCG/ACT nasal spray Place 2 sprays into both nostrils daily. 16 g 6   glucose blood (ONETOUCH VERIO) test strip Check blood glucose once daily as needed 100 each 12   hyoscyamine  (LEVSIN  SL) 0.125 MG SL tablet Take 1 tablet (0.125 mg total) by mouth every 6 (six) hours as needed for bladder pain and urgency (Patient not taking: Reported on 10/23/2023) 30 tablet 0   Insulin  Pen Needle (BD PEN NEEDLE NANO 2ND GEN) 32G X 4 MM MISC USE TO INJECT DIABETIC MEDICATION INTO THE SKIN 1 TIME DAILY 100 each 3   ipratropium-albuterol  (DUONEB) 0.5-2.5 (3) MG/3ML SOLN Take 3 mLs by nebulization every 4 (four) hours as needed. 75 mL 0   ketorolac  (TORADOL ) 10 MG tablet  Take 1 tablet (10 mg total) by mouth every 8 (eight) hours as needed. 15 tablet 0   levocetirizine (XYZAL ) 5 MG tablet TAKE ONE TABLET BY MOUTH EVERY EVENING 90 tablet 3   LORazepam  (ATIVAN ) 1 MG tablet Take 1 tablet (1 mg total) by mouth 2 (two) times daily as needed for anxiety. (Patient not taking: Reported on 10/23/2023) 4 tablet 0   losartan  (COZAAR ) 100 MG tablet TAKE 1 TABLET BY MOUTH DAILY 100 tablet 3   montelukast  (SINGULAIR ) 10 MG tablet TAKE 1 TABLET BY MOUTH AT BEDTIME 90 tablet 1   mupirocin  ointment (BACTROBAN ) 2 % Apply 1 Application topically daily.     omeprazole  (PRILOSEC) 40 MG capsule Take 1 capsule (40 mg total) by mouth daily. 90 capsule 0   ondansetron  (ZOFRAN ) 4 MG tablet Take 1 tablet (4 mg total) by mouth every 8 (eight) hours as needed. (Patient not taking: Reported on 10/23/2023) 30 tablet 0   OneTouch Delica Lancets 30G MISC Check blood sugar once daily as needed 100 each 12   oxyCODONE -acetaminophen  (PERCOCET/ROXICET) 5-325 MG tablet Take 1 tablet by mouth every 6 (six) hours as needed. 15 tablet 0   rosuvastatin  (CRESTOR ) 5 MG tablet TAKE 1 TABLET BY MOUTH DAILY 90 tablet 1   SYMBICORT  160-4.5 MCG/ACT inhaler INHALE 2 PUFFS BY MOUTH EVERY MORNING AND INHALE 2 PUFFS BY MOUTH EVERY NIGHT AT BEDTIME 10.2 g 1   SYRINGE-NEEDLE, DISP, 3 ML (LUER LOCK SAFETY SYRINGES) 25G X 1 3 ML MISC Use as directed with weekly b12 injections 10 each 0   tamsulosin  (FLOMAX ) 0.4 MG CAPS capsule Take 1 capsule (0.4 mg total) by mouth at bedtime. 30 capsule 0   tirzepatide  (MOUNJARO ) 5 MG/0.5ML Pen Inject 5 mg into the skin once a week. 2 mL 0   triamcinolone cream (KENALOG) 0.1 % Apply 1 Application topically daily.     No current facility-administered medications on file prior to visit.  BP 136/84   Pulse 80   Resp 18   Ht 5' 5 (1.651 m)   Wt 210 lb (95.3 kg)   SpO2 98%   BMI 34.95 kg/m        Objective:   Physical Exam  General Mental Status- Alert. General Appearance-  Not in acute distress.   Skin General: Color- Normal Color. Moisture- Normal Moisture.  Neck  No JVD.  Chest and Lung Exam Auscultation: Breath Sounds:-CTA  Cardiovascular Auscultation:Rythm- RRR Murmurs & Other Heart Sounds:Auscultation of the heart reveals- No Murmurs.  Abdomen Inspection:-Inspeection Normal. Palpation/Percussion:Note:No mass. Palpation and Percussion of the abdomen reveal- Non Tender, Non Distended + BS, no rebound or guarding.   Neurologic Cranial Nerve exam:- CN III-XII intact(No nystagmus), symmetric smile. Strength:- 5/5 equal and symmetric strength both upper and lower extremities.       Assessment & Plan:   Patient Instructions  Type 2 diabetes mellitus Type 2 diabetes with last A1c of 7.6. Currently on Mounjaro  5 mg without side effects. No family hx thyroid  cancer or personal history of pancreatitis. Assess glycemic control and adjust medication if needed. - Order A1c and metabolic panel. - Evaluate A1c for Mounjaro  dosage adjustment. - Consider increasing Mounjaro  if A1c not controlled and no side effects. - Order  urine microalbumin   Generalized anxiety disorder Anxiety managed with Buspirone  15 mg BID. Heightened due to husband's leukemia but not affecting daily function. Previously on Prozac , discontinued for weight gain. Ativan  used briefly in past for severe episodes. Not needed presently. - Continue Buspirone  15 mg BID.  Obesity Obesity with weight at 210 lbs, down from 225 lbs post-Prozac . Weight loss goal with low sugar diet and protein shakes. Weight management important for diabetes control. - Encourage low sugar diet and protein intake.   Follow up 3-6 month or sooner if needed   [employer], PA-C

## 2023-12-17 ENCOUNTER — Ambulatory Visit: Payer: Self-pay | Admitting: Medical

## 2023-12-17 ENCOUNTER — Other Ambulatory Visit: Payer: Self-pay | Admitting: Medical

## 2023-12-17 LAB — COMPLETE METABOLIC PANEL WITHOUT GFR
AG Ratio: 1.4 (calc) (ref 1.0–2.5)
ALT: 30 U/L — ABNORMAL HIGH (ref 6–29)
AST: 18 U/L (ref 10–35)
Albumin: 4.1 g/dL (ref 3.6–5.1)
Alkaline phosphatase (APISO): 75 U/L (ref 37–153)
BUN: 18 mg/dL (ref 7–25)
CO2: 18 mmol/L — ABNORMAL LOW (ref 20–32)
Calcium: 9.3 mg/dL (ref 8.6–10.4)
Chloride: 113 mmol/L — ABNORMAL HIGH (ref 98–110)
Creat: 0.9 mg/dL (ref 0.50–1.03)
Globulin: 3 g/dL (ref 1.9–3.7)
Glucose, Bld: 132 mg/dL — ABNORMAL HIGH (ref 65–99)
Potassium: 3.6 mmol/L (ref 3.5–5.3)
Sodium: 140 mmol/L (ref 135–146)
Total Bilirubin: 0.6 mg/dL (ref 0.2–1.2)
Total Protein: 7.1 g/dL (ref 6.1–8.1)

## 2023-12-17 LAB — HEMOGLOBIN A1C: Hgb A1c MFr Bld: 7.5 % — ABNORMAL HIGH (ref 4.6–6.5)

## 2023-12-17 LAB — HEPATITIS B SURFACE ANTIBODY, QUANTITATIVE: Hep B S AB Quant (Post): <5 m[IU]/mL — ABNORMAL LOW (ref >=10)

## 2023-12-18 MED ORDER — TIRZEPATIDE 7.5 MG/0.5ML ~~LOC~~ SOAJ
7.5000 mg | SUBCUTANEOUS | 0 refills | Status: DC
Start: 2023-12-18 — End: 2024-02-24

## 2023-12-18 NOTE — Addendum Note (Signed)
 Addended by: DORINA DALLAS HERO on: 12/18/2023 08:38 AM   Modules accepted: Orders

## 2023-12-23 ENCOUNTER — Other Ambulatory Visit: Payer: Self-pay | Admitting: Medical

## 2023-12-27 ENCOUNTER — Ambulatory Visit (INDEPENDENT_AMBULATORY_CARE_PROVIDER_SITE_OTHER): Admitting: Neurosurgery

## 2023-12-27 ENCOUNTER — Encounter: Payer: Self-pay | Admitting: Neurosurgery

## 2023-12-27 VITALS — BP 117/80 | HR 85 | Ht 65.0 in | Wt 211.0 lb

## 2023-12-27 DIAGNOSIS — Z9689 Presence of other specified functional implants: Secondary | ICD-10-CM

## 2023-12-27 DIAGNOSIS — G932 Benign intracranial hypertension: Secondary | ICD-10-CM | POA: Diagnosis not present

## 2023-12-27 NOTE — Progress Notes (Signed)
 53 year old lady with history of idiopathic intracranial hypertension and a transverse sinus stent placed almost a year ago.  Patient had recurrence of her symptoms and was started on Diamox and currently is on 500 mg twice a day.  She is completely asymptomatic as it comes to her headaches however she is having some memory problems.  I told her that it is very well possible that this is from Diamox and I gave her some recommendations on how to manage the day-to-day schedule to have reminders helping her remember things.  As it is a side effect of Diamox I think that tapering the drive off would be an option however the trade off there would be that she would have headaches again which she does not want.  She also saw the ophthalmology team at [facility] who told her that her eyes look much much better and she does not have any papilledema.  She is going to be switched to Mounjaro  for her weight loss and once her weight comes down from with about 10 to 15 pounds, then we will talk again at which point we can start to taper the Diamox very very slowly to see if we can keep her headaches at bay.  She has been to call me once she has lost some weight.

## 2023-12-28 ENCOUNTER — Other Ambulatory Visit: Payer: Self-pay | Admitting: Medical

## 2023-12-29 ENCOUNTER — Other Ambulatory Visit: Payer: Self-pay | Admitting: Medical

## 2024-01-10 DIAGNOSIS — R519 Headache, unspecified: Secondary | ICD-10-CM | POA: Diagnosis not present

## 2024-01-10 DIAGNOSIS — G932 Benign intracranial hypertension: Secondary | ICD-10-CM | POA: Diagnosis not present

## 2024-01-10 DIAGNOSIS — H4710 Unspecified papilledema: Secondary | ICD-10-CM | POA: Diagnosis not present

## 2024-02-08 ENCOUNTER — Other Ambulatory Visit: Payer: Self-pay | Admitting: Medical

## 2024-02-09 ENCOUNTER — Other Ambulatory Visit: Payer: Self-pay | Admitting: Medical

## 2024-02-12 ENCOUNTER — Encounter: Payer: Self-pay | Admitting: Medical

## 2024-02-14 DIAGNOSIS — N200 Calculus of kidney: Secondary | ICD-10-CM | POA: Diagnosis not present

## 2024-02-15 NOTE — Addendum Note (Signed)
 Addended by: DORINA DALLAS HERO on: 02/15/2024 09:40 AM   Modules accepted: Orders

## 2024-02-18 ENCOUNTER — Other Ambulatory Visit: Payer: Self-pay | Admitting: Medical

## 2024-02-21 ENCOUNTER — Encounter: Payer: Self-pay | Admitting: Medical

## 2024-02-24 MED ORDER — TIRZEPATIDE 10 MG/0.5ML ~~LOC~~ SOAJ: 10.0000 mg | SUBCUTANEOUS | 0 refills | Status: DC

## 2024-02-24 NOTE — Addendum Note (Signed)
 Addended by: DORINA DALLAS HERO on: 02/24/2024 11:26 AM   Modules accepted: Orders

## 2024-02-25 ENCOUNTER — Ambulatory Visit

## 2024-02-25 ENCOUNTER — Other Ambulatory Visit: Payer: Self-pay | Admitting: Medical

## 2024-03-03 ENCOUNTER — Ambulatory Visit (INDEPENDENT_AMBULATORY_CARE_PROVIDER_SITE_OTHER)

## 2024-03-03 DIAGNOSIS — L84 Corns and callosities: Secondary | ICD-10-CM

## 2024-03-03 DIAGNOSIS — Q828 Other specified congenital malformations of skin: Secondary | ICD-10-CM

## 2024-03-03 DIAGNOSIS — L989 Disorder of the skin and subcutaneous tissue, unspecified: Secondary | ICD-10-CM | POA: Diagnosis not present

## 2024-03-04 NOTE — Progress Notes (Signed)
 Subjective:  Patient ID: Theresa Carter, female    DOB: Jul 17, 1970,  MRN: [id number]  Chief Complaint  Patient presents with   Plantar Warts    Rm8  Plantar wart right foot/ 1 month/ otc treatment with no improvement/ pain with pressure.    53 y.o. female presents with the above complaint.  She complains of pain to her right lateral forefoot on the bottom.  She states that she does have a history of warts.  She states that over the last month she has noticed a lesion develop that is painful to palpation, especially in shoe gear.  She has used corn pads with some relief.  Review of Systems: Negative except as noted in the HPI. Denies N/V/F/Ch.  Past Medical History:  Diagnosis Date   Anxiety    Arthritis    Asthma    Chest pain 07/29/2018   Diabetes mellitus without complication (HCC)    Ectopic pregnancy    GERD (gastroesophageal reflux disease)    Headache    History of hiatal hernia    History of kidney stones    Hypertension     Current Outpatient Medications:    acetaminophen  (TYLENOL ) 500 MG tablet, Take 1,000 mg by mouth every 6 (six) hours as needed for mild pain., Disp: , Rfl:    acetaZOLAMIDE ER (DIAMOX) 500 MG capsule, Take 500 mg by mouth 2 (two) times daily., Disp: , Rfl:    Adalimumab (HUMIRA PEN) 40 MG/0.4ML PNKT, Inject 40 mg into the skin once a week. Tuesday, Disp: , Rfl:    albuterol  (VENTOLIN  HFA) 108 (90 Base) MCG/ACT inhaler, INHALE 2 PUFFS BY MOUTH EVERY 6 HOURS AS NEEDED FOR WHEEZING OR SHORTNESS OF BREATH, Disp: 8.5 g, Rfl: 1   amLODipine  (NORVASC ) 5 MG tablet, TAKE 1 TABLET BY MOUTH DAILY, Disp: 90 tablet, Rfl: 3   aspirin  EC 81 MG tablet, Take 81 mg by mouth daily. Swallow whole., Disp: , Rfl:    Blood Glucose Monitoring Suppl (ONETOUCH VERIO) w/Device KIT, 1 kit by Does not apply route daily. Check blood glucose once daily as needed, Disp: 1 kit, Rfl: 0   busPIRone  (BUSPAR ) 15 MG tablet, Take 1 tablet (15 mg total) by mouth 2 (two) times daily.,  Disp: 180 tablet, Rfl: 0   cefdinir (OMNICEF) 300 MG capsule, Take 300 mg by mouth 2 (two) times daily., Disp: , Rfl:    cyclobenzaprine  (FLEXERIL ) 5 MG tablet, TAKE 1 TABLET BY MOUTH EVERY NIGHT AT BEDTIME AS NEEDED FOR NECK PAIN, Disp: 20 tablet, Rfl: 1   dutasteride (AVODART) 0.5 MG capsule, Take one tablet daily on week days (Monday-Friday). Do not take if pregnant or trying to get pregnant., Disp: , Rfl:    fluticasone  (FLONASE ) 50 MCG/ACT nasal spray, Place 2 sprays into both nostrils daily., Disp: 16 g, Rfl: 6   glucose blood (ONETOUCH VERIO) test strip, Check blood glucose once daily as needed, Disp: 100 each, Rfl: 12   Insulin  Pen Needle (BD PEN NEEDLE NANO 2ND GEN) 32G X 4 MM MISC, USE TO INJECT DIABETIC MEDICATION INTO THE SKIN 1 TIME DAILY, Disp: 100 each, Rfl: 3   ipratropium-albuterol  (DUONEB) 0.5-2.5 (3) MG/3ML SOLN, Take 3 mLs by nebulization every 4 (four) hours as needed., Disp: 75 mL, Rfl: 0   levocetirizine (XYZAL ) 5 MG tablet, TAKE ONE TABLET BY MOUTH EVERY EVENING, Disp: 90 tablet, Rfl: 3   losartan  (COZAAR ) 100 MG tablet, TAKE 1 TABLET BY MOUTH DAILY, Disp: 100 tablet, Rfl: 3  montelukast  (SINGULAIR ) 10 MG tablet, TAKE 1 TABLET BY MOUTH AT BEDTIME, Disp: 90 tablet, Rfl: 1   mupirocin  ointment (BACTROBAN ) 2 %, Apply 1 Application topically daily., Disp: , Rfl:    omeprazole  (PRILOSEC) 40 MG capsule, TAKE 1 CAPSULE BY MOUTH DAILY, Disp: 90 capsule, Rfl: 0   OneTouch Delica Lancets 30G MISC, Check blood sugar once daily as needed, Disp: 100 each, Rfl: 12   rosuvastatin  (CRESTOR ) 5 MG tablet, TAKE 1 TABLET BY MOUTH DAILY, Disp: 90 tablet, Rfl: 1   SYMBICORT  160-4.5 MCG/ACT inhaler, INHALE 2 PUFFS BY MOUTH 2 TIMES A DAY, Disp: 10.2 g, Rfl: 1   SYRINGE-NEEDLE, DISP, 3 ML (LUER LOCK SAFETY SYRINGES) 25G X 1 3 ML MISC, Use as directed with weekly b12 injections, Disp: 10 each, Rfl: 0   tirzepatide  (MOUNJARO ) 10 MG/0.5ML Pen, Inject 10 mg into the skin once a week., Disp: 2 mL, Rfl:  0   triamcinolone cream (KENALOG) 0.1 %, Apply 1 Application topically daily., Disp: , Rfl:    hyoscyamine  (LEVSIN  SL) 0.125 MG SL tablet, Take 1 tablet (0.125 mg total) by mouth every 6 (six) hours as needed for bladder pain and urgency (Patient not taking: Reported on 10/23/2023), Disp: 30 tablet, Rfl: 0   ketorolac  (TORADOL ) 10 MG tablet, Take 1 tablet (10 mg total) by mouth every 8 (eight) hours as needed. (Patient not taking: Reported on 12/27/2023), Disp: 15 tablet, Rfl: 0   LORazepam  (ATIVAN ) 1 MG tablet, Take 1 tablet (1 mg total) by mouth 2 (two) times daily as needed for anxiety. (Patient not taking: Reported on 10/23/2023), Disp: 4 tablet, Rfl: 0   ondansetron  (ZOFRAN ) 4 MG tablet, Take 1 tablet (4 mg total) by mouth every 8 (eight) hours as needed. (Patient not taking: Reported on 10/23/2023), Disp: 30 tablet, Rfl: 0   oxyCODONE -acetaminophen  (PERCOCET/ROXICET) 5-325 MG tablet, Take 1 tablet by mouth every 6 (six) hours as needed. (Patient not taking: Reported on 12/27/2023), Disp: 15 tablet, Rfl: 0   tamsulosin  (FLOMAX ) 0.4 MG CAPS capsule, Take 1 capsule (0.4 mg total) by mouth at bedtime. (Patient not taking: Reported on 12/27/2023), Disp: 30 capsule, Rfl: 0  Social History   Tobacco Use  Smoking Status Former   Current packs/day: 0.00   Types: Cigarettes   Quit date: 2001   Years since quitting: 24.8  Smokeless Tobacco Never  Tobacco Comments   Quit 2001.    No Known Allergies Objective:  There were no vitals filed for this visit. There is no height or weight on file to calculate BMI. Constitutional Well developed. Well nourished. Oriented to person, place, and time.  Vascular Dorsalis pedis pulses palpable bilaterally. Posterior tibial pulses palpable bilaterally. Capillary refill normal to all digits.  No cyanosis or clubbing noted. Pedal hair growth normal.  Neurologic Normal speech. Epicritic sensation to light touch grossly present bilaterally. Negative tinel sign at  tarsal tunnel bilaterally.   Dermatologic Skin texture and turgor are within normal limits.  No open wounds. Hyperkeratotic lesion with central core plantar to the right fifth metatarsophalangeal joint.  Upon debridement, central opaque core identified and removed.  No pinpoint bleeding, skin lines continuous.  Musculoskeletal: 5 out of 5 muscle strength all major pedal muscle groups.  No contributing deformity.    Assessment:   1. Corns and callosities   2. Benign skin lesion   3. Porokeratosis    Plan:  - Patient was evaluated and treated and all questions answered.  Porokeratosis, right foot -Discussed with the patient  the diagnosis of porokeratosis.  This was debrided sharply utilizing a 15 blade without incident.  Patient expressed significant satisfaction after debridement. - She was given dancers pads to offload the area.  We discussed that if these were successful, she can consider custom orthotics versus an over-the-counter option with a metatarsal pad. - Return to clinic as needed   Prentice Ovens, [facility] Fellowship Trained Podiatric Surgeon [facility]

## 2024-03-08 ENCOUNTER — Other Ambulatory Visit: Payer: Self-pay | Admitting: Medical

## 2024-03-12 ENCOUNTER — Other Ambulatory Visit: Payer: Self-pay | Admitting: Medical

## 2024-03-14 ENCOUNTER — Other Ambulatory Visit: Payer: Self-pay | Admitting: Medical

## 2024-03-17 ENCOUNTER — Encounter: Payer: Self-pay | Admitting: Medical

## 2024-03-17 MED ORDER — ALPRAZOLAM 0.5 MG PO TABS: ORAL_TABLET | ORAL | 0 refills | Status: AC

## 2024-03-17 NOTE — Addendum Note (Signed)
 Addended by: DORINA DALLAS HERO on: 03/17/2024 03:25 PM   Modules accepted: Orders

## 2024-03-19 ENCOUNTER — Other Ambulatory Visit: Payer: Self-pay | Admitting: Medical

## 2024-03-24 ENCOUNTER — Ambulatory Visit: Admitting: Medical

## 2024-03-24 ENCOUNTER — Encounter: Payer: Self-pay | Admitting: Medical

## 2024-03-24 ENCOUNTER — Other Ambulatory Visit: Payer: Self-pay | Admitting: Medical

## 2024-03-24 VITALS — BP 126/84 | HR 74 | Temp 97.8°F | Resp 16 | Ht 65.0 in | Wt 204.4 lb

## 2024-03-24 DIAGNOSIS — R5383 Other fatigue: Secondary | ICD-10-CM

## 2024-03-24 DIAGNOSIS — E1165 Type 2 diabetes mellitus with hyperglycemia: Secondary | ICD-10-CM

## 2024-03-24 DIAGNOSIS — R232 Flushing: Secondary | ICD-10-CM

## 2024-03-24 DIAGNOSIS — F411 Generalized anxiety disorder: Secondary | ICD-10-CM

## 2024-03-24 DIAGNOSIS — R6889 Other general symptoms and signs: Secondary | ICD-10-CM

## 2024-03-24 MED ORDER — ALPRAZOLAM 0.5 MG PO TABS: ORAL_TABLET | ORAL | 0 refills | Status: DC

## 2024-03-24 NOTE — Patient Instructions (Signed)
 Generalized anxiety disorder with recent described panic level anxiety intermittetnly. Increased anxiety due to stressors. Current Buspar  dose insufficient. Previous Prozac  caused weight gain. Xanax  discussed for acute episodes. - Increased Buspar  to 15 mg three times daily. - Prescribed Xanax  0.5 to 1 mg, up to twice daily as needed for severe anxiety. - Instructed to monitor response to increased Buspar  dosage and report in one week. - Discussed potential cognitive side effects of Xanax  and advised trial use to assess impact.  Type 2 diabetes mellitus with hyperglycemia Due for A1c testing this month. - Ordered A1c test.  Menopausal symptoms (heat intolerance, hot flashes, fatigue) Symptoms may be related to menopause or other metabolic causes. - Ordered TSH, T4, FSH, B12, CBC, and metabolic panel to evaluate for thyroid  dysfunction and other causes.  Renal angiomyolipoma, left kidney Stable condition. Monitoring for symptoms indicating complications. - Continue monitoring for symptoms such as pain or sudden changes. - Advised to seek immediate medical attention if experiencing severe pain or symptoms of bleeding.  Follow up in one month or sooner if needed

## 2024-03-24 NOTE — Progress Notes (Unsigned)
 Subjective:    Patient ID: Theresa Carter, female    DOB: 10/27/1970, 53 y.o.   MRN: [id number]  HPI Theresa Carter is a 53 year old female who presents with worsening anxiety symptoms.  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 These are associated with a sense of panic and urge to remove her gloves and scrubs to cool down, which she finds worrisome.  Her energy is low and she feels fatigued, which she attributes to balancing studying and hospital work. She feels unable to control her anxiety as before despite Buspar  15 mg twice daily, and her prior mental coping strategies are no longer effective.  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 She retains her ovaries and has had no recent vaginal bleeding after prior tubal ligation and endometrial ablation 11 years ago with cessation of menses.   Review of Systems  Constitutional:  Positive for fatigue. Negative for chills and fever.  HENT:  Negative for congestion.   Respiratory:  Negative for cough, chest tightness and wheezing.   Cardiovascular:  Negative for chest pain and palpitations.  Gastrointestinal:  Negative for abdominal pain.  Endocrine: Positive for heat intolerance.  Genitourinary:  Negative for dysuria and hematuria.  Skin:  Negative for rash.  Neurological:  Negative for dizziness, speech difficulty, weakness and light-headedness.  Hematological:  Negative for adenopathy.  Psychiatric/Behavioral:  Negative for behavioral problems, dysphoric mood and suicidal ideas. The patient is nervous/anxious. The patient is not hyperactive.     Past Medical  History:  Diagnosis Date   Anxiety    Arthritis    Asthma    Chest pain 07/29/2018   Diabetes mellitus without complication (HCC)    Ectopic pregnancy    GERD (gastroesophageal reflux disease)    Headache    History of hiatal hernia    History of kidney stones    Hypertension      Social History   Socioeconomic History   Marital status: Married    Spouse name: Not on file   Number of children: Not on file   Years of education: Not on file   Highest education level: GED or equivalent  Occupational History   Not on file  Tobacco Use   Smoking status: Former    Current packs/day: 0.00    Types: Cigarettes    Quit date: 2001    Years since quitting: 24.9   Smokeless tobacco: Never   Tobacco comments:    Quit 2001.  Vaping Use   Vaping status: Never Used  Substance and Sexual Activity   Alcohol use: Yes    Comment: RARE   Drug use: Never   Sexual activity: Not on file  Other Topics Concern   Not on file  Social History Narrative   Not on file   Social Drivers of Health   Financial Resource Strain: Medium Risk (03/24/2024)   Overall Financial Resource Strain (CARDIA)    Difficulty of Paying Living Expenses: Somewhat hard  Food Insecurity: No Food Insecurity (03/24/2024)   Hunger Vital Sign    Worried About Running Out of Food in the Last Year: Never true    Ran Out of Food in the  Last Year: Never true  Transportation Needs: No Transportation Needs (03/24/2024)   PRAPARE - Administrator, Civil Service (Medical): No    Lack of Transportation (Non-Medical): No  Physical Activity: Sufficiently Active (03/24/2024)   Exercise Vital Sign    Days of Exercise per Week: 3 days    Minutes of Exercise per Session: 60 min  Stress: Stress Concern Present (03/24/2024)   [employer] of Occupational Health - Occupational Stress Questionnaire    Feeling of Stress: Rather much  Social Connections: Moderately Isolated (03/24/2024)   Social Connection and Isolation  Panel    Frequency of Communication with Friends and Family: Twice a week    Frequency of Social Gatherings with Friends and Family: Once a week    Attends Religious Services: Never    Database Administrator or Organizations: No    Attends Engineer, Structural: Not on file    Marital Status: Married  Catering Manager Violence: Not At Risk (10/23/2023)   Humiliation, Afraid, Rape, and Kick questionnaire    Fear of Current or Ex-Partner: No    Emotionally Abused: No    Physically Abused: No    Sexually Abused: No    Past Surgical History:  Procedure Laterality Date   BRAIN SURGERY  01/02/2023   stent placed in artery ([facility])   CHOLECYSTECTOMY     CYSTOSCOPY W/ URETERAL STENT PLACEMENT Right 06/26/2023   Procedure: CYSTOSCOPY, WITH RETROGRADE PYELOGRAM AND URETERAL STENT INSERTION;  Surgeon: Elisabeth Valli BIRCH, MD;  Location: [facility];  Service: Urology;  Laterality: Right;   CYSTOSCOPY/URETEROSCOPY/HOLMIUM LASER/STENT PLACEMENT Right 07/11/2023   Procedure: CYSTOSCOPY/URETEROSCOPY, REMOVAL OF STONE BASKETING;  Surgeon: Elisabeth Valli BIRCH, MD;  Location: [facility];  Service: Urology;  Laterality: Right;  CYSTOSCOPY/URETEROSCOPY/HOLMIUM LASER/STENT EXCHANGE   DILATION AND CURETTAGE OF UTERUS     ENDOMETRIAL ABLATION     HIP ARTHROSCOPY Left    x2  second time hip was reconstructed with donated tisue   SALPINGECTOMY     TRIGGER FINGER RELEASE     TUBAL LIGATION     WRIST ARTHROSCOPY      Family History  Problem Relation Age of Onset   Venous thrombosis Mother    Breast cancer Mother    Lung cancer Father    Aneurysm Father        brain   Heart attack Brother    Stroke Brother    Venous thrombosis Son        Has antiphospholipid syndrome, and bechets disease.    No Known Allergies  Current Outpatient Medications on File Prior to Visit  Medication Sig Dispense Refill   acetaminophen  (TYLENOL ) 500 MG tablet Take 1,000 mg by mouth every 6 (six) hours as needed for mild pain.      acetaZOLAMIDE ER (DIAMOX) 500 MG capsule Take 500 mg by mouth 2 (two) times daily.     Adalimumab (HUMIRA PEN) 40 MG/0.4ML PNKT Inject 40 mg into the skin once a week. Tuesday     albuterol  (VENTOLIN  HFA) 108 (90 Base) MCG/ACT inhaler INHALE 2 PUFFS BY MOUTH EVERY 6 HOURS AS NEEDED FOR WHEEZING OR SHORTNESS OF BREATH 8.5 g 1   ALPRAZolam  (XANAX ) 0.5 MG tablet 1 tab po bid prn severe anxiety/panic attack 6 tablet 0   amLODipine  (NORVASC ) 5 MG tablet TAKE 1 TABLET BY MOUTH DAILY 90 tablet 3   aspirin  EC 81 MG tablet Take 81 mg by mouth daily. Swallow whole.     Blood Glucose Monitoring  Suppl (ONETOUCH VERIO) w/Device KIT 1 kit by Does not apply route daily. Check blood glucose once daily as needed 1 kit 0   busPIRone  (BUSPAR ) 15 MG tablet Take 1 tablet (15 mg total) by mouth 2 (two) times daily. 180 tablet 0   cefdinir (OMNICEF) 300 MG capsule Take 300 mg by mouth 2 (two) times daily.     cyclobenzaprine  (FLEXERIL ) 5 MG tablet TAKE 1 TABLET BY MOUTH EVERY NIGHT AT BEDTIME AS NEEDED FOR NECK PAIN 20 tablet 1   dutasteride (AVODART) 0.5 MG capsule Take one tablet daily on week days (Monday-Friday). Do not take if pregnant or trying to get pregnant.     fluticasone  (FLONASE ) 50 MCG/ACT nasal spray Place 2 sprays into both nostrils daily. 16 g 6   glucose blood (ONETOUCH VERIO) test strip Check blood glucose once daily as needed 100 each 12   Insulin  Pen Needle (BD PEN NEEDLE NANO 2ND GEN) 32G X 4 MM MISC USE TO INJECT DIABETIC MEDICATION INTO THE SKIN 1 TIME DAILY 100 each 3   ipratropium-albuterol  (DUONEB) 0.5-2.5 (3) MG/3ML SOLN Take 3 mLs by nebulization every 4 (four) hours as needed. 75 mL 0   levocetirizine (XYZAL ) 5 MG tablet TAKE ONE TABLET BY MOUTH EVERY EVENING 90 tablet 3   losartan  (COZAAR ) 100 MG tablet TAKE 1 TABLET BY MOUTH DAILY 100 tablet 3   montelukast  (SINGULAIR ) 10 MG tablet TAKE 1 TABLET BY MOUTH AT BEDTIME 90 tablet 1   MOUNJARO  10 MG/0.5ML Pen INJECT 10 MG UNDER THE SKIN ONCE  WEEKLY 2 mL 0   mupirocin  ointment (BACTROBAN ) 2 % Apply 1 Application topically daily.     omeprazole  (PRILOSEC) 40 MG capsule Take 1 capsule (40 mg total) by mouth daily. 90 capsule 0   OneTouch Delica Lancets 30G MISC Check blood sugar once daily as needed 100 each 12   rosuvastatin  (CRESTOR ) 5 MG tablet TAKE 1 TABLET BY MOUTH DAILY 90 tablet 1   SYMBICORT  160-4.5 MCG/ACT inhaler INHALE 2 PUFFS BY MOUTH 2 TIMES A DAY 10.2 g 1   SYRINGE-NEEDLE, DISP, 3 ML (LUER LOCK SAFETY SYRINGES) 25G X 1 3 ML MISC Use as directed with weekly b12 injections 10 each 0   triamcinolone cream (KENALOG) 0.1 % Apply 1 Application topically daily.     No current facility-administered medications on file prior to visit.    BP 126/84   Pulse 74   Temp 97.8 F (36.6 C) (Oral)   Resp 16   Ht 5' 5 (1.651 m)   Wt 204 lb 6.4 oz (92.7 kg)   SpO2 99%   BMI 34.01 kg/m        Objective:   Physical Exam  General Mental Status- Alert. General Appearance- Not in acute distress.   Skin General: Color- Normal Color. Moisture- Normal Moisture.  Neck  No carotid bruits. No JVD. -no thyromegaly.  Chest and Lung Exam Auscultation: Breath Sounds:-CTA  Cardiovascular Auscultation:Rythm- RRR Murmurs & Other Heart Sounds:Auscultation of the heart reveals- No Murmurs.  Abdomen Inspection:-Inspeection Normal. Palpation/Percussion:Note:No mass. Palpation and Percussion of the abdomen reveal- Non Tender, Non Distended + BS, no rebound or guarding.   Neurologic Cranial Nerve exam:- CN III-XII intact(No nystagmus), symmetric smile. Strength:- 5/5 equal and symmetric strength both upper and lower extremities.       Assessment & Plan:  Generalized anxiety disorder with recent described panic level anxiety intermittetnly. Increased anxiety due to stressors. Current Buspar  dose insufficient. Previous Prozac  caused weight gain. Xanax  discussed for  acute episodes. - Increased Buspar  to 15 mg three times  daily. - Prescribed Xanax  0.5 to 1 mg, up to twice daily as needed for severe anxiety. - Instructed to monitor response to increased Buspar  dosage and report in one week. - Discussed potential cognitive side effects of Xanax  and advised trial use to assess impact.  Type 2 diabetes mellitus with hyperglycemia Due for A1c testing this month. - Ordered A1c test.  Menopausal symptoms (heat intolerance, hot flashes, fatigue) Symptoms may be related to menopause or other metabolic causes. - Ordered TSH, T4, FSH, B12, CBC, and metabolic panel to evaluate for thyroid  dysfunction and other causes.  Renal angiomyolipoma, left kidney Stable condition. Monitoring for symptoms indicating complications. - Continue monitoring for symptoms such as pain or sudden changes. - Advised to seek immediate medical attention if experiencing severe pain or symptoms of bleeding.  Follow up in one month or sooner if needed

## 2024-03-25 ENCOUNTER — Ambulatory Visit: Payer: Self-pay | Admitting: Medical

## 2024-03-25 LAB — CBC WITH DIFFERENTIAL/PLATELET
Basophils Absolute: 0.1 K/uL (ref 0.0–0.1)
Basophils Relative: 1.2 % (ref 0.0–3.0)
Eosinophils Absolute: 0.3 K/uL (ref 0.0–0.7)
Eosinophils Relative: 3.4 % (ref 0.0–5.0)
HCT: 37.5 % (ref 36.0–46.0)
Hemoglobin: 12.4 g/dL (ref 12.0–15.0)
Lymphocytes Relative: 40.9 % (ref 12.0–46.0)
Lymphs Abs: 3.2 K/uL (ref 0.7–4.0)
MCHC: 33.1 g/dL (ref 30.0–36.0)
MCV: 91.2 fl (ref 78.0–100.0)
Monocytes Absolute: 0.5 K/uL (ref 0.1–1.0)
Monocytes Relative: 6.1 % (ref 3.0–12.0)
Neutro Abs: 3.8 K/uL (ref 1.4–7.7)
Neutrophils Relative %: 48.4 % (ref 43.0–77.0)
Platelets: 378 K/uL (ref 150.0–400.0)
RBC: 4.11 Mil/uL (ref 3.87–5.11)
RDW: 14.6 % (ref 11.5–15.5)
WBC: 7.9 K/uL (ref 4.0–10.5)

## 2024-03-25 LAB — FOLLICLE STIMULATING HORMONE: FSH: 59.6 m[IU]/mL

## 2024-03-25 LAB — VITAMIN B12: Vitamin B-12: 191 pg/mL — ABNORMAL LOW (ref 211–911)

## 2024-03-25 LAB — COMPREHENSIVE METABOLIC PANEL WITH GFR
ALT: 34 U/L (ref 0–35)
AST: 19 U/L (ref 0–37)
Albumin: 4.4 g/dL (ref 3.5–5.2)
Alkaline Phosphatase: 74 U/L (ref 39–117)
BUN: 18 mg/dL (ref 6–23)
CO2: 19 meq/L (ref 19–32)
Calcium: 9.2 mg/dL (ref 8.4–10.5)
Chloride: 114 meq/L — ABNORMAL HIGH (ref 96–112)
Creatinine, Ser: 0.88 mg/dL (ref 0.40–1.20)
GFR: 75.68 mL/min (ref >60.00)
Glucose, Bld: 104 mg/dL — ABNORMAL HIGH (ref 70–99)
Potassium: 4.1 meq/L (ref 3.5–5.1)
Sodium: 143 meq/L (ref 135–145)
Total Bilirubin: 0.5 mg/dL (ref 0.2–1.2)
Total Protein: 7.4 g/dL (ref 6.0–8.3)

## 2024-03-25 LAB — TSH: TSH: 0.95 u[IU]/mL (ref 0.35–5.50)

## 2024-03-25 LAB — T4, FREE: Free T4: 0.72 ng/dL (ref 0.60–1.60)

## 2024-03-28 LAB — VITAMIN B1: Vitamin B1 (Thiamine): 12 nmol/L (ref 8–30)

## 2024-03-31 ENCOUNTER — Encounter: Payer: Self-pay | Admitting: Medical

## 2024-04-03 ENCOUNTER — Ambulatory Visit: Admitting: *Deleted

## 2024-04-03 DIAGNOSIS — E538 Deficiency of other specified B group vitamins: Secondary | ICD-10-CM | POA: Diagnosis not present

## 2024-04-03 MED ORDER — CYANOCOBALAMIN 1000 MCG/ML IJ SOLN
1000.0000 ug | Freq: Once | INTRAMUSCULAR | Status: AC
  Administered 2024-04-03: 1000 ug via INTRAMUSCULAR

## 2024-04-03 NOTE — Progress Notes (Signed)
 Patient here for 1/4 weekly b12 injection per physicians order.  Injection given in left deltoid and patient tolerated well.

## 2024-04-10 ENCOUNTER — Ambulatory Visit

## 2024-04-10 DIAGNOSIS — E538 Deficiency of other specified B group vitamins: Secondary | ICD-10-CM

## 2024-04-10 MED ORDER — CYANOCOBALAMIN 1000 MCG/ML IJ SOLN
1000.0000 ug | Freq: Once | INTRAMUSCULAR | Status: AC
  Administered 2024-04-10: 1000 ug via INTRAMUSCULAR

## 2024-04-10 NOTE — Progress Notes (Signed)
 Patient is here for 2/4 weekly B12 injection per original order dated: 03/25/2024 Your b12 level is low. Recommend getting set up for weekly b12 vitamin injection over next 4 weeks followed by monthly injection for 5 months. Per Dallas Maxwell, PA-C  Last B12 injection:04/03/2024  Last B12 level: 03/24/2024  B12 1000mcg given in Right Deltoid IM, and patient tolerated injection well.  Next B12 injection scheduled for:  Next Week - 3/4 B12 Injection on 04/17/2024 @ 11:30am

## 2024-04-17 ENCOUNTER — Ambulatory Visit

## 2024-04-17 ENCOUNTER — Other Ambulatory Visit: Payer: Self-pay | Admitting: Medical

## 2024-04-19 ENCOUNTER — Encounter: Payer: Self-pay | Admitting: Medical

## 2024-04-20 ENCOUNTER — Ambulatory Visit

## 2024-04-20 ENCOUNTER — Other Ambulatory Visit: Payer: Self-pay | Admitting: Family

## 2024-04-20 ENCOUNTER — Other Ambulatory Visit: Payer: Self-pay | Admitting: Medical

## 2024-04-20 DIAGNOSIS — E538 Deficiency of other specified B group vitamins: Secondary | ICD-10-CM | POA: Diagnosis not present

## 2024-04-20 MED ORDER — TIRZEPATIDE 12.5 MG/0.5ML ~~LOC~~ SOAJ: 12.5000 mg | SUBCUTANEOUS | 0 refills | Status: DC

## 2024-04-20 MED ORDER — CYANOCOBALAMIN 1000 MCG/ML IJ SOLN
1000.0000 ug | Freq: Once | INTRAMUSCULAR | Status: AC
  Administered 2024-04-20: 1000 ug via INTRAMUSCULAR

## 2024-04-20 NOTE — Progress Notes (Signed)
 Pt here for weekly B12 #3 of 4 injection per original order dated: 03/25/2024 Your b12 level is low. Recommend getting set up for weekly b12 vitamin injection over next 4 weeks followed by monthly injection for 5 months. Per Dallas Maxwell, PA-C   Last B12 injection:04/10/24  Last B12 level:  03/24/24 was 191  B12 given IM, and pt tolerated injection well.  Next B12 injection scheduled for: 04/28/24.

## 2024-04-20 NOTE — Telephone Encounter (Signed)
 Requesting: alprazolam  0.5mg  Contract: None UDS: None Last Visit: 03/24/24 Next Visit: None Last Refill: 03/24/24 #6 and 0RF   Please Advise

## 2024-04-21 ENCOUNTER — Encounter: Payer: Self-pay | Admitting: Medical

## 2024-04-22 ENCOUNTER — Encounter: Payer: Self-pay | Admitting: Medical

## 2024-04-27 ENCOUNTER — Other Ambulatory Visit: Payer: Self-pay

## 2024-04-27 ENCOUNTER — Ambulatory Visit (INDEPENDENT_AMBULATORY_CARE_PROVIDER_SITE_OTHER)

## 2024-04-27 DIAGNOSIS — E538 Deficiency of other specified B group vitamins: Secondary | ICD-10-CM

## 2024-04-27 MED ORDER — CYANOCOBALAMIN 1000 MCG/ML IJ SOLN
1000.0000 ug | Freq: Once | INTRAMUSCULAR | Status: AC
  Administered 2024-04-27: 1000 ug via INTRAMUSCULAR

## 2024-04-27 MED ORDER — TIRZEPATIDE 12.5 MG/0.5ML ~~LOC~~ SOAJ: 12.5000 mg | SUBCUTANEOUS | 0 refills | Status: AC

## 2024-04-27 NOTE — Progress Notes (Signed)
 Patient is here for weekly B12 injection #4 of 4 injection per original order dated: 03/25/2024 Your b12 level is low. Recommend getting set up for weekly b12 vitamin injection over next 4 weeks followed by monthly injection for 5 months. Per Dallas Maxwell, PA-C   Last B12 injection: 04/20/2024  Last B12 level:  03/24/2024 was 191  B12 given in right deltoid IM, and patient tolerated injection well.  Next MONTHLY B12 injection scheduled for: February 6th 2026 at 11:15am

## 2024-04-28 ENCOUNTER — Ambulatory Visit

## 2024-04-29 ENCOUNTER — Other Ambulatory Visit: Payer: Self-pay

## 2024-05-01 ENCOUNTER — Telehealth: Payer: Self-pay

## 2024-05-01 NOTE — Telephone Encounter (Signed)
 Says PA is needed for pts rx mounjaro 

## 2024-05-05 ENCOUNTER — Other Ambulatory Visit: Payer: Self-pay | Admitting: Medical

## 2024-05-05 ENCOUNTER — Other Ambulatory Visit: Payer: Self-pay

## 2024-05-05 ENCOUNTER — Other Ambulatory Visit (HOSPITAL_COMMUNITY): Payer: Self-pay

## 2024-05-05 ENCOUNTER — Encounter: Payer: Self-pay | Admitting: Medical

## 2024-05-05 MED ORDER — CYCLOBENZAPRINE HCL 5 MG PO TABS: ORAL_TABLET | ORAL | 1 refills | Status: AC

## 2024-05-06 ENCOUNTER — Other Ambulatory Visit (HOSPITAL_COMMUNITY): Payer: Self-pay

## 2024-05-06 ENCOUNTER — Telehealth: Payer: Self-pay

## 2024-05-06 NOTE — Telephone Encounter (Signed)
 Pharmacy Patient Advocate Encounter   Received notification from Physician's Office that prior authorization for Mounjaro  12.5MG /0.5ML auto-injectors is required/requested.   Insurance verification completed.   The patient is insured through [facility].   Per test claim: PA required; PA started via CoverMyMeds. KEY [id number] . Waiting for clinical questions to populate.

## 2024-05-07 ENCOUNTER — Other Ambulatory Visit (HOSPITAL_COMMUNITY): Payer: Self-pay

## 2024-05-07 NOTE — Telephone Encounter (Signed)
 Pharmacy Patient Advocate Encounter  Received notification from [facility] that Prior Authorization for Mounjaro  12.5MG /0.5ML auto-injectors has been APPROVED from 04/23/2024 to 04/22/2025. Ran test claim, Copay is $12.65. This test claim was processed through [facility]- copay amounts may vary at other pharmacies due to pharmacy/plan contracts, or as the patient moves through the different stages of their insurance plan.   PA #/Case ID/Reference #: [id number]

## 2024-05-11 ENCOUNTER — Telehealth: Payer: Self-pay

## 2024-05-11 MED ORDER — FLUTICASONE-SALMETEROL 230-21 MCG/ACT IN AERO: 2.0000 | INHALATION_SPRAY | Freq: Two times a day (BID) | RESPIRATORY_TRACT | 12 refills | Status: AC

## 2024-05-11 NOTE — Addendum Note (Signed)
 Addended by: DORINA DALLAS DORINA PA-C M on: 05/11/2024 09:25 PM   Modules accepted: Orders

## 2024-05-11 NOTE — Telephone Encounter (Signed)
 Fax came in that pr received a temp supply of SYMBICORT  but insurance will not cover this rx. Alternative is requested for this rx

## 2024-05-15 ENCOUNTER — Other Ambulatory Visit: Payer: Self-pay | Admitting: Medical

## 2024-05-27 ENCOUNTER — Encounter: Payer: Self-pay | Admitting: Medical

## 2024-05-29 ENCOUNTER — Ambulatory Visit

## 2024-05-29 DIAGNOSIS — E538 Deficiency of other specified B group vitamins: Secondary | ICD-10-CM

## 2024-05-29 MED ORDER — CYANOCOBALAMIN 1000 MCG/ML IJ SOLN
1000.0000 ug | Freq: Once | INTRAMUSCULAR | Status: AC
  Administered 2024-05-29: 1000 ug via INTRAMUSCULAR

## 2024-05-29 NOTE — Progress Notes (Signed)
 Patient here for first monthly b12 injection per physicians order.  Injection given in right deltoid and patient tolerated well.  Next appointment scheduled for 06/26/24.

## 2024-06-26 ENCOUNTER — Ambulatory Visit
